# Patient Record
Sex: Female | Born: 1950 | Race: White | Hispanic: No | Smoking: Current some day smoker
Health system: Southern US, Community
[De-identification: ages and names within clinical notes are randomized; demographics above are authoritative.]

## PROBLEM LIST (undated history)

## (undated) DIAGNOSIS — H903 Sensorineural hearing loss, bilateral: Secondary | ICD-10-CM

## (undated) DIAGNOSIS — I10 Essential (primary) hypertension: Secondary | ICD-10-CM

## (undated) DIAGNOSIS — M1990 Unspecified osteoarthritis, unspecified site: Secondary | ICD-10-CM

## (undated) DIAGNOSIS — Z9889 Other specified postprocedural states: Secondary | ICD-10-CM

## (undated) DIAGNOSIS — J449 Chronic obstructive pulmonary disease, unspecified: Secondary | ICD-10-CM

## (undated) DIAGNOSIS — R7303 Prediabetes: Secondary | ICD-10-CM

## (undated) DIAGNOSIS — T7840XA Allergy, unspecified, initial encounter: Secondary | ICD-10-CM

## (undated) DIAGNOSIS — R0600 Dyspnea, unspecified: Secondary | ICD-10-CM

## (undated) DIAGNOSIS — G8929 Other chronic pain: Secondary | ICD-10-CM

## (undated) DIAGNOSIS — K759 Inflammatory liver disease, unspecified: Secondary | ICD-10-CM

## (undated) DIAGNOSIS — H9319 Tinnitus, unspecified ear: Secondary | ICD-10-CM

## (undated) DIAGNOSIS — I7300 Raynaud's syndrome without gangrene: Secondary | ICD-10-CM

## (undated) DIAGNOSIS — M5410 Radiculopathy, site unspecified: Secondary | ICD-10-CM

## (undated) DIAGNOSIS — F418 Other specified anxiety disorders: Secondary | ICD-10-CM

## (undated) DIAGNOSIS — Z8719 Personal history of other diseases of the digestive system: Secondary | ICD-10-CM

## (undated) HISTORY — DX: Allergy, unspecified, initial encounter: T78.40XA

## (undated) HISTORY — PX: TUBAL LIGATION: SHX77

## (undated) HISTORY — PX: CHOLECYSTECTOMY: SHX55

## (undated) HISTORY — PX: TONSILLECTOMY: SUR1361

## (undated) HISTORY — PX: VARICOSE VEIN SURGERY: SHX832

## (undated) HISTORY — DX: Radiculopathy, site unspecified: M54.10

---

## 2006-10-03 HISTORY — PX: AXILLARY SURGERY: SHX892

## 2013-06-18 DIAGNOSIS — M19079 Primary osteoarthritis, unspecified ankle and foot: Secondary | ICD-10-CM | POA: Insufficient documentation

## 2013-06-25 DIAGNOSIS — M792 Neuralgia and neuritis, unspecified: Secondary | ICD-10-CM | POA: Insufficient documentation

## 2013-10-03 HISTORY — PX: LAPAROSCOPIC GASTRIC SLEEVE RESECTION WITH HIATAL HERNIA REPAIR: SHX6512

## 2014-01-27 ENCOUNTER — Other Ambulatory Visit: Payer: Self-pay | Admitting: Physical Medicine and Rehabilitation

## 2014-01-27 DIAGNOSIS — M545 Low back pain, unspecified: Secondary | ICD-10-CM

## 2014-01-27 DIAGNOSIS — M79604 Pain in right leg: Secondary | ICD-10-CM

## 2014-02-05 ENCOUNTER — Other Ambulatory Visit: Payer: Self-pay

## 2014-02-11 ENCOUNTER — Ambulatory Visit
Admission: RE | Admit: 2014-02-11 | Discharge: 2014-02-11 | Disposition: A | Discharge status: Home / Self Care | Payer: Medicare Other | Source: Ambulatory Visit | Attending: Physical Medicine and Rehabilitation | Admitting: Physical Medicine and Rehabilitation

## 2014-02-11 DIAGNOSIS — M545 Low back pain, unspecified: Secondary | ICD-10-CM

## 2014-02-11 DIAGNOSIS — M79604 Pain in right leg: Secondary | ICD-10-CM

## 2014-05-31 DIAGNOSIS — E559 Vitamin D deficiency, unspecified: Secondary | ICD-10-CM | POA: Insufficient documentation

## 2014-06-12 ENCOUNTER — Ambulatory Visit: Payer: Self-pay | Admitting: Bariatrics

## 2014-06-12 DIAGNOSIS — J449 Chronic obstructive pulmonary disease, unspecified: Secondary | ICD-10-CM | POA: Insufficient documentation

## 2014-06-12 DIAGNOSIS — J4489 Other specified chronic obstructive pulmonary disease: Secondary | ICD-10-CM | POA: Insufficient documentation

## 2014-06-12 DIAGNOSIS — Z01810 Encounter for preprocedural cardiovascular examination: Secondary | ICD-10-CM

## 2014-06-12 DIAGNOSIS — F418 Other specified anxiety disorders: Secondary | ICD-10-CM | POA: Insufficient documentation

## 2014-06-12 DIAGNOSIS — F331 Major depressive disorder, recurrent, moderate: Secondary | ICD-10-CM | POA: Insufficient documentation

## 2014-06-12 DIAGNOSIS — F5104 Psychophysiologic insomnia: Secondary | ICD-10-CM | POA: Insufficient documentation

## 2014-06-16 DIAGNOSIS — M1990 Unspecified osteoarthritis, unspecified site: Secondary | ICD-10-CM | POA: Insufficient documentation

## 2014-06-16 DIAGNOSIS — M5416 Radiculopathy, lumbar region: Secondary | ICD-10-CM | POA: Insufficient documentation

## 2014-11-27 ENCOUNTER — Ambulatory Visit: Payer: Self-pay | Admitting: Emergency Medicine

## 2015-05-02 DIAGNOSIS — G8929 Other chronic pain: Secondary | ICD-10-CM | POA: Insufficient documentation

## 2015-06-09 ENCOUNTER — Other Ambulatory Visit: Payer: Self-pay | Admitting: Obstetrics and Gynecology

## 2015-06-09 DIAGNOSIS — IMO0002 Reserved for concepts with insufficient information to code with codable children: Secondary | ICD-10-CM | POA: Insufficient documentation

## 2015-06-09 DIAGNOSIS — Z1231 Encounter for screening mammogram for malignant neoplasm of breast: Secondary | ICD-10-CM

## 2015-06-17 ENCOUNTER — Ambulatory Visit
Admission: RE | Admit: 2015-06-17 | Discharge: 2015-06-17 | Disposition: A | Discharge status: Home / Self Care | Payer: Medicare Other | Source: Ambulatory Visit | Attending: Obstetrics and Gynecology | Admitting: Obstetrics and Gynecology

## 2015-06-17 DIAGNOSIS — N6459 Other signs and symptoms in breast: Secondary | ICD-10-CM | POA: Insufficient documentation

## 2015-06-17 DIAGNOSIS — Z1231 Encounter for screening mammogram for malignant neoplasm of breast: Secondary | ICD-10-CM | POA: Diagnosis not present

## 2015-06-22 ENCOUNTER — Other Ambulatory Visit: Payer: Self-pay | Admitting: Obstetrics and Gynecology

## 2015-06-22 DIAGNOSIS — R928 Other abnormal and inconclusive findings on diagnostic imaging of breast: Secondary | ICD-10-CM

## 2015-06-26 ENCOUNTER — Ambulatory Visit: Payer: Medicare Other

## 2015-07-03 ENCOUNTER — Ambulatory Visit
Admission: RE | Admit: 2015-07-03 | Discharge: 2015-07-03 | Disposition: A | Discharge status: Home / Self Care | Payer: Medicare Other | Source: Ambulatory Visit | Attending: Obstetrics and Gynecology | Admitting: Obstetrics and Gynecology

## 2015-07-03 DIAGNOSIS — R928 Other abnormal and inconclusive findings on diagnostic imaging of breast: Secondary | ICD-10-CM | POA: Diagnosis present

## 2015-09-11 ENCOUNTER — Encounter: Payer: Self-pay | Admitting: Emergency Medicine

## 2015-09-11 ENCOUNTER — Ambulatory Visit
Admission: EM | Admit: 2015-09-11 | Discharge: 2015-09-11 | Discharge status: Absent without leave | Payer: Medicare Other

## 2015-09-11 NOTE — ED Notes (Signed)
Patient concerned if she has a prolapsed bladder.  Patient states that she felt something buldging when she went to the bathroom this morning.  Patient states that she has an appointment with her doctor on Monday.

## 2015-09-14 ENCOUNTER — Other Ambulatory Visit: Payer: Self-pay | Admitting: Obstetrics and Gynecology

## 2015-09-14 DIAGNOSIS — N6489 Other specified disorders of breast: Secondary | ICD-10-CM

## 2015-12-08 DIAGNOSIS — E782 Mixed hyperlipidemia: Secondary | ICD-10-CM | POA: Insufficient documentation

## 2015-12-10 DIAGNOSIS — F418 Other specified anxiety disorders: Secondary | ICD-10-CM | POA: Insufficient documentation

## 2016-01-01 ENCOUNTER — Ambulatory Visit
Admission: RE | Admit: 2016-01-01 | Discharge: 2016-01-01 | Disposition: A | Discharge status: Home / Self Care | Payer: Medicare Other | Source: Ambulatory Visit | Attending: Obstetrics and Gynecology | Admitting: Obstetrics and Gynecology

## 2016-01-01 DIAGNOSIS — N6489 Other specified disorders of breast: Secondary | ICD-10-CM | POA: Diagnosis present

## 2016-01-21 ENCOUNTER — Encounter
Admission: RE | Admit: 2016-01-21 | Discharge: 2016-01-21 | Disposition: A | Discharge status: Home / Self Care | Payer: Medicare Other | Source: Ambulatory Visit | Attending: Obstetrics and Gynecology | Admitting: Obstetrics and Gynecology

## 2016-01-21 DIAGNOSIS — F418 Other specified anxiety disorders: Secondary | ICD-10-CM | POA: Diagnosis not present

## 2016-01-21 DIAGNOSIS — J42 Unspecified chronic bronchitis: Secondary | ICD-10-CM | POA: Diagnosis not present

## 2016-01-21 DIAGNOSIS — D259 Leiomyoma of uterus, unspecified: Secondary | ICD-10-CM | POA: Insufficient documentation

## 2016-01-21 DIAGNOSIS — F5104 Psychophysiologic insomnia: Secondary | ICD-10-CM | POA: Diagnosis not present

## 2016-01-21 DIAGNOSIS — Z01810 Encounter for preprocedural cardiovascular examination: Secondary | ICD-10-CM | POA: Diagnosis present

## 2016-01-21 DIAGNOSIS — G8929 Other chronic pain: Secondary | ICD-10-CM | POA: Diagnosis not present

## 2016-01-21 DIAGNOSIS — I10 Essential (primary) hypertension: Secondary | ICD-10-CM | POA: Diagnosis not present

## 2016-01-21 DIAGNOSIS — N8110 Cystocele, unspecified: Secondary | ICD-10-CM | POA: Insufficient documentation

## 2016-01-21 HISTORY — DX: Personal history of other diseases of the digestive system: Z87.19

## 2016-01-21 HISTORY — DX: Inflammatory liver disease, unspecified: K75.9

## 2016-01-21 HISTORY — DX: Other specified postprocedural states: Z98.890

## 2016-01-21 HISTORY — DX: Essential (primary) hypertension: I10

## 2016-01-21 NOTE — H&P (Signed)
Lori Trevino is a 65 y.o. female here for TVH / BSO and anterior colporrhaphy for a symptomatic uterine descensus and 2-3 degree cystocele U/S with noted fibroids 5cm+2cm+2cm .  . . Not sexually active, but would like in the future . + urgency . No problems emptying And no BM difficulties . No splinting    Past Medical History:  has a past medical history of Chronic insomnia; Chronic pain; COPD with chronic bronchitis , unspecified (CMS-HCC); Depression with anxiety; Osteoarthritis; Phlebitis; Right lumbar radiculopathy (06/16/2014); and Vitamin D deficiency, unspecified (05/31/2014).  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 Family History: family history includes Hypertension in her mother and sister; Osteoarthritis in her mother; Prostate cancer in her father; Skin cancer in her father. There is no history of Breast cancer. Social History:  reports that she has been smoking Cigarettes. She has been smoking about 0.10 packs per day. She has never used smokeless tobacco. She reports that she does not drink alcohol or use illicit drugs. OB/GYN History:  OB History    Gravida Para Term Preterm AB TAB SAB Ectopic Multiple Living   2 2 2       2       Allergies: is allergic to ambien [zolpidem] and metoprolol succinate. Medications:  Current Outpatient Prescriptions:  . calcium carbonate-vitamin D3 600 mg(1,500mg ) -500 unit Cap, Take 1 capsule by mouth once daily., Disp: , Rfl:  . cholecalciferol (VITAMIN D3) 2,000 unit tablet, Take 2,000 Units by mouth once daily., Disp: , Rfl:  . cyclobenzaprine (FLEXERIL) 10 MG tablet, 1/2-1 po tid prn, Disp: 90 tablet,  Rfl: 5 . estradiol (ESTRACE) 0.01 % (0.1 mg/gram) vaginal cream, Place 2 g vaginally once daily. Insert 1/4 applicator twice weekly, Disp: 30 g, Rfl: 2 . eszopiclone (LUNESTA) 2 MG tablet, Take 1 tablet (2 mg total) by mouth nightly as needed for Sleep., Disp: 30 tablet, Rfl: 5 . HYDROcodone-acetaminophen (NORCO) 10-325 mg tablet, 1/2-1 po tid prn Earliest Fill Date: 12/15/15, Disp: 70 tablet, Rfl: 0 . MILK THISTLE ORAL, Take 1 capsule by mouth once daily., Disp: , Rfl:  . multivitamin tablet, Take 1 tablet by mouth once daily., Disp: , Rfl:  . Saccharomyces boulardii (FLORASTOR) 250 mg capsule, Take 250 mg by mouth once daily., Disp: , Rfl:  . TURMERIC (CURCUMIN MISC), Use 1 tablet., Disp: , Rfl:  . biotin 10,000 mcg Cap, Take 1 capsule by mouth once daily. Reported on 12/15/2015 , Disp: , Rfl:  . tolterodine (DETROL LA) 2 MG LA capsule, Take 1 capsule (2 mg total) by mouth once daily., Disp: 30 capsule, Rfl: 11  Review of Systems: General:   No fatigue or weight loss Eyes:   No vision changes Ears:   No hearing difficulty Respiratory:   No cough or shortness of breath Pulmonary:   No asthma or shortness of breath Cardiovascular:  No chest pain, palpitations, dyspnea on exertion Gastrointestinal:  No abdominal bloating, chronic diarrhea, constipations, masses, pain or hematochezia Genitourinary:  No hematuria, dysuria, abnormal vaginal discharge, pelvic pain, Menometrorrhagia, + urgency , + pelvic relaxation  Lymphatic:  No swollen lymph nodes Musculoskeletal: No muscle weakness Neurologic:  No extremity weakness, syncope, seizure disorder Psychiatric:  No history of depression, delusions or suicidal/homicidal ideation   Exam:  Vitals:   01/21/16  BP: 128/82    Body mass index is 27 kg/(m^2).  WDWN white/ female in NAD  Lungs: CTA  CV : RRR without murmur  Breast: exam done in sitting and lying position : No dimpling or retraction, no dominant mass, no spontaneous  discharge, no axillary adenopathy Neck: no thyromegaly Abdomen: soft , no mass, normal active bowel sounds, non-tender, no rebound tenderness Pelvic: tanner stage 5 ,  External genitalia: vulva /labia no lesions Urethra: no prolapse Vagina: normal physiologic d/c, 2-3 degree cystocele , minimal rectocele ,  Cervix: no lesions, no cervical motion tenderness  Uterus: normal size shape and contour, non-tender 2 degree uterine descensus Adnexa: no mass, non-tender 3x3 cm left adnexal mass  Rectovaginal: not done   Impression:   The primary encounter diagnosis was Midline cystocele. Diagnoses of Adnexal mass, Cystocele with uterine descensus, and OAB (overactive bladder) were also pertinent to this visit.    Plan:   TVH and BSO and anterior colporrhaphy  Pt is aware of the potential risks of surgery and the risk of organ injury, blood loss requiring a blood transfusion  With infectious risks and risk of pain sex after the procedure . Urinary retention and incontinence have been discussed . All questions answered         Orders Placed This Encounter  Procedures  . US pelvic transvaginal

## 2016-01-21 NOTE — Patient Instructions (Signed)
Your procedure is scheduled on: Monday 02/08/16 Report to Day Surgery. 2ND FLOOR MEDICAL MALL ENTRANCE To find out your arrival time please call [phone] between 1PM - 3PM on Friday 02/05/16.  Remember: Instructions that are not followed completely may result in serious medical risk, up to and including death, or upon the discretion of your surgeon and anesthesiologist your surgery may need to be rescheduled.    __X__ 1. Do not eat food or drink liquids after midnight. No gum chewing or hard candies.     __X__ 2. No Alcohol for 24 hours before or after surgery.   ____ 3. Bring all medications with you on the day of surgery if instructed.    __X__ 4. Notify your doctor if there is any change in your medical condition     (cold, fever, infections).     Do not wear jewelry, make-up, hairpins, clips or nail polish.  Do not wear lotions, powders, or perfumes.   Do not shave 48 hours prior to surgery. Men may shave face and neck.  Do not bring valuables to the hospital.    [facility] is not responsible for any belongings or valuables.               Contacts, dentures or bridgework may not be worn into surgery.  Leave your suitcase in the car. After surgery it may be brought to your room.  For patients admitted to the hospital, discharge time is determined by your                treatment team.   Patients discharged the day of surgery will not be allowed to drive home.   Please read over the following fact sheets that you were given:   Surgical Site Infection Prevention   ____ Take these medicines the morning of surgery with A SIP OF WATER:    1. NONE  2.   3.   4.  5.  6.  ____ Fleet Enema (as directed)   __X__ Use CHG Soap as directed  ____ Use inhalers on the day of surgery  ____ Stop metformin 2 days prior to surgery    ____ Take 1/2 of usual insulin dose the night before surgery and none on the morning of surgery.   ____ Stop Coumadin/Plavix/aspirin on   ____ Stop  Anti-inflammatories on    __X__ Stop supplements until after surgery.  B-COMPLEX  ____ Bring C-Pap to the hospital.

## 2016-01-25 ENCOUNTER — Encounter
Admission: RE | Admit: 2016-01-25 | Discharge: 2016-01-25 | Disposition: A | Discharge status: Home / Self Care | Payer: Medicare Other | Source: Ambulatory Visit | Attending: Obstetrics and Gynecology | Admitting: Obstetrics and Gynecology

## 2016-01-25 DIAGNOSIS — Z01810 Encounter for preprocedural cardiovascular examination: Secondary | ICD-10-CM | POA: Insufficient documentation

## 2016-01-25 DIAGNOSIS — Z01812 Encounter for preprocedural laboratory examination: Secondary | ICD-10-CM | POA: Insufficient documentation

## 2016-01-25 LAB — TYPE AND SCREEN
ABO/RH(D): A POS
ANTIBODY SCREEN: NEGATIVE

## 2016-01-25 LAB — ABO/RH: ABO/RH(D): A POS

## 2016-01-25 NOTE — Pre-Procedure Instructions (Addendum)
MEDICAL CLEARANCE REQUEST/EKG AS INSTRUCTED BY DR P CARROLL CALLED AND FAXED TO DR Wallie Renshaw AND SPOKE WITH BARBARA. ALSO TO DR Crab [place]chard SPOKE WITH BECKY LYNN

## 2016-01-27 NOTE — Pre-Procedure Instructions (Signed)
DR THIESS WOULD NOT CLEAR. DR PARACHOS CLEARED LOW RISK 01/27/16

## 2016-02-04 ENCOUNTER — Inpatient Hospital Stay: Admission: RE | Admit: 2016-02-04 | Payer: Medicare Other | Source: Ambulatory Visit

## 2016-02-08 ENCOUNTER — Observation Stay
Admission: RE | Admit: 2016-02-08 | Discharge: 2016-02-09 | Disposition: A | Discharge status: Home / Self Care | Payer: Medicare Other | Source: Ambulatory Visit | Attending: Obstetrics and Gynecology | Admitting: Obstetrics and Gynecology

## 2016-02-08 ENCOUNTER — Encounter: Payer: Self-pay | Admitting: *Deleted

## 2016-02-08 ENCOUNTER — Inpatient Hospital Stay: Payer: Medicare Other | Admitting: Certified Registered"

## 2016-02-08 ENCOUNTER — Encounter
Admission: RE | Disposition: A | Discharge status: Home / Self Care | Payer: Self-pay | Source: Ambulatory Visit | Attending: Obstetrics and Gynecology

## 2016-02-08 DIAGNOSIS — Z888 Allergy status to other drugs, medicaments and biological substances status: Secondary | ICD-10-CM | POA: Insufficient documentation

## 2016-02-08 DIAGNOSIS — Z8042 Family history of malignant neoplasm of prostate: Secondary | ICD-10-CM | POA: Insufficient documentation

## 2016-02-08 DIAGNOSIS — R339 Retention of urine, unspecified: Secondary | ICD-10-CM | POA: Diagnosis not present

## 2016-02-08 DIAGNOSIS — N812 Incomplete uterovaginal prolapse: Secondary | ICD-10-CM | POA: Diagnosis present

## 2016-02-08 DIAGNOSIS — Z903 Acquired absence of stomach [part of]: Secondary | ICD-10-CM | POA: Diagnosis not present

## 2016-02-08 DIAGNOSIS — J449 Chronic obstructive pulmonary disease, unspecified: Secondary | ICD-10-CM | POA: Diagnosis not present

## 2016-02-08 DIAGNOSIS — N888 Other specified noninflammatory disorders of cervix uteri: Secondary | ICD-10-CM | POA: Diagnosis not present

## 2016-02-08 DIAGNOSIS — F17210 Nicotine dependence, cigarettes, uncomplicated: Secondary | ICD-10-CM | POA: Insufficient documentation

## 2016-02-08 DIAGNOSIS — F329 Major depressive disorder, single episode, unspecified: Secondary | ICD-10-CM | POA: Diagnosis not present

## 2016-02-08 DIAGNOSIS — Z8261 Family history of arthritis: Secondary | ICD-10-CM | POA: Insufficient documentation

## 2016-02-08 DIAGNOSIS — Z79899 Other long term (current) drug therapy: Secondary | ICD-10-CM | POA: Diagnosis not present

## 2016-02-08 DIAGNOSIS — N838 Other noninflammatory disorders of ovary, fallopian tube and broad ligament: Secondary | ICD-10-CM | POA: Insufficient documentation

## 2016-02-08 DIAGNOSIS — E559 Vitamin D deficiency, unspecified: Secondary | ICD-10-CM | POA: Insufficient documentation

## 2016-02-08 DIAGNOSIS — M1990 Unspecified osteoarthritis, unspecified site: Secondary | ICD-10-CM | POA: Diagnosis not present

## 2016-02-08 DIAGNOSIS — D259 Leiomyoma of uterus, unspecified: Secondary | ICD-10-CM | POA: Insufficient documentation

## 2016-02-08 DIAGNOSIS — F5104 Psychophysiologic insomnia: Secondary | ICD-10-CM | POA: Insufficient documentation

## 2016-02-08 DIAGNOSIS — M5416 Radiculopathy, lumbar region: Secondary | ICD-10-CM | POA: Insufficient documentation

## 2016-02-08 DIAGNOSIS — F419 Anxiety disorder, unspecified: Secondary | ICD-10-CM | POA: Insufficient documentation

## 2016-02-08 DIAGNOSIS — Z808 Family history of malignant neoplasm of other organs or systems: Secondary | ICD-10-CM | POA: Diagnosis not present

## 2016-02-08 DIAGNOSIS — G8929 Other chronic pain: Secondary | ICD-10-CM | POA: Insufficient documentation

## 2016-02-08 DIAGNOSIS — Z8249 Family history of ischemic heart disease and other diseases of the circulatory system: Secondary | ICD-10-CM | POA: Diagnosis not present

## 2016-02-08 DIAGNOSIS — N3281 Overactive bladder: Secondary | ICD-10-CM | POA: Insufficient documentation

## 2016-02-08 DIAGNOSIS — Z9889 Other specified postprocedural states: Secondary | ICD-10-CM | POA: Diagnosis not present

## 2016-02-08 HISTORY — PX: VAGINAL HYSTERECTOMY: SHX2639

## 2016-02-08 HISTORY — PX: CYSTOCELE REPAIR: SHX163

## 2016-02-08 SURGERY — HYSTERECTOMY, VAGINAL
Anesthesia: General | Site: Vagina | Wound class: Clean Contaminated

## 2016-02-08 MED ORDER — SULFANILAMIDE 15 % VA CREA
TOPICAL_CREAM | VAGINAL | Status: AC
  Filled 2016-02-08: qty 120

## 2016-02-08 MED ORDER — ONDANSETRON HCL 4 MG/2ML IJ SOLN
INTRAMUSCULAR | Status: DC | PRN
  Administered 2016-02-08: 4 mg via INTRAVENOUS

## 2016-02-08 MED ORDER — LACTATED RINGERS IV SOLN
INTRAVENOUS | Status: DC
  Administered 2016-02-08: 07:00:00 via INTRAVENOUS

## 2016-02-08 MED ORDER — LIDOCAINE-EPINEPHRINE 1 %-1:100000 IJ SOLN
INTRAMUSCULAR | Status: AC
  Filled 2016-02-08: qty 1

## 2016-02-08 MED ORDER — FENTANYL CITRATE (PF) 100 MCG/2ML IJ SOLN
INTRAMUSCULAR | Status: DC | PRN
  Administered 2016-02-08: 50 ug via INTRAVENOUS
  Administered 2016-02-08: 100 ug via INTRAVENOUS

## 2016-02-08 MED ORDER — ACETAMINOPHEN 10 MG/ML IV SOLN
INTRAVENOUS | Status: DC | PRN
  Administered 2016-02-08: 1000 mg via INTRAVENOUS

## 2016-02-08 MED ORDER — NEOSTIGMINE METHYLSULFATE 10 MG/10ML IV SOLN
INTRAVENOUS | Status: DC | PRN
  Administered 2016-02-08: 3 mg via INTRAVENOUS

## 2016-02-08 MED ORDER — GLYCOPYRROLATE 0.2 MG/ML IJ SOLN
INTRAMUSCULAR | Status: DC | PRN
  Administered 2016-02-08: 0.4 mg via INTRAVENOUS
  Administered 2016-02-08: 0.2 mg via INTRAVENOUS

## 2016-02-08 MED ORDER — CEFOXITIN SODIUM-DEXTROSE 2-2.2 GM-% IV SOLR (PREMIX)
2.0000 g | INTRAVENOUS | Status: AC
  Administered 2016-02-08: 2000 mg via INTRAVENOUS

## 2016-02-08 MED ORDER — DEXAMETHASONE SODIUM PHOSPHATE 10 MG/ML IJ SOLN
INTRAMUSCULAR | Status: DC | PRN
  Administered 2016-02-08: 4 mg via INTRAVENOUS

## 2016-02-08 MED ORDER — MIDAZOLAM HCL 2 MG/2ML IJ SOLN
INTRAMUSCULAR | Status: DC | PRN
  Administered 2016-02-08 (×2): 2 mg via INTRAVENOUS

## 2016-02-08 MED ORDER — FAMOTIDINE 20 MG PO TABS
20.0000 mg | ORAL_TABLET | Freq: Once | ORAL | Status: AC
  Administered 2016-02-08: 20 mg via ORAL

## 2016-02-08 MED ORDER — SCOPOLAMINE 1 MG/3DAYS TD PT72
1.0000 | MEDICATED_PATCH | Freq: Once | TRANSDERMAL | Status: DC
  Administered 2016-02-08: 1.5 mg via TRANSDERMAL

## 2016-02-08 MED ORDER — PROPOFOL 10 MG/ML IV BOLUS
INTRAVENOUS | Status: DC | PRN
  Administered 2016-02-08: 140 mg via INTRAVENOUS

## 2016-02-08 MED ORDER — ONDANSETRON HCL 4 MG/2ML IJ SOLN
4.0000 mg | Freq: Once | INTRAMUSCULAR | Status: AC | PRN
  Administered 2016-02-08: 4 mg via INTRAVENOUS

## 2016-02-08 MED ORDER — DOCUSATE SODIUM 100 MG PO CAPS
100.0000 mg | ORAL_CAPSULE | Freq: Two times a day (BID) | ORAL | Status: DC | PRN
  Administered 2016-02-08 – 2016-02-09 (×2): 100 mg via ORAL
  Filled 2016-02-08 (×2): qty 1

## 2016-02-08 MED ORDER — FENTANYL CITRATE (PF) 100 MCG/2ML IJ SOLN
INTRAMUSCULAR | Status: AC
  Filled 2016-02-08: qty 2

## 2016-02-08 MED ORDER — EPHEDRINE SULFATE 50 MG/ML IJ SOLN
INTRAMUSCULAR | Status: DC | PRN
  Administered 2016-02-08: 10 mg via INTRAVENOUS

## 2016-02-08 MED ORDER — FENTANYL CITRATE (PF) 100 MCG/2ML IJ SOLN
25.0000 ug | INTRAMUSCULAR | Status: DC | PRN
  Administered 2016-02-08 (×2): 25 ug via INTRAVENOUS
  Administered 2016-02-08 (×2): 50 ug via INTRAVENOUS

## 2016-02-08 MED ORDER — ACETAMINOPHEN 10 MG/ML IV SOLN
INTRAVENOUS | Status: AC
  Filled 2016-02-08: qty 100

## 2016-02-08 MED ORDER — KETOROLAC TROMETHAMINE 30 MG/ML IJ SOLN
30.0000 mg | Freq: Three times a day (TID) | INTRAMUSCULAR | Status: AC | PRN
  Administered 2016-02-08 – 2016-02-09 (×3): 30 mg via INTRAVENOUS
  Filled 2016-02-08 (×4): qty 1

## 2016-02-08 MED ORDER — ONDANSETRON HCL 4 MG/2ML IJ SOLN: 4.0000 mg | Freq: Four times a day (QID) | INTRAMUSCULAR | Status: DC | PRN

## 2016-02-08 MED ORDER — LIDOCAINE HCL (CARDIAC) 20 MG/ML IV SOLN
INTRAVENOUS | Status: DC | PRN
  Administered 2016-02-08: 80 mg via INTRAVENOUS

## 2016-02-08 MED ORDER — LACTATED RINGERS IV SOLN
INTRAVENOUS | Status: DC
  Administered 2016-02-08 – 2016-02-09 (×2): via INTRAVENOUS

## 2016-02-08 MED ORDER — LIDOCAINE-EPINEPHRINE 1 %-1:100000 IJ SOLN
INTRAMUSCULAR | Status: DC | PRN
  Administered 2016-02-08: 16 mL

## 2016-02-08 MED ORDER — MORPHINE SULFATE (PF) 2 MG/ML IV SOLN
1.0000 mg | INTRAVENOUS | Status: DC | PRN
  Administered 2016-02-08 – 2016-02-09 (×5): 2 mg via INTRAVENOUS
  Filled 2016-02-08 (×5): qty 1

## 2016-02-08 MED ORDER — CITRIC ACID-SODIUM CITRATE 334-500 MG/5ML PO SOLN
30.0000 mL | ORAL | Status: DC
  Filled 2016-02-08: qty 30

## 2016-02-08 MED ORDER — ONDANSETRON HCL 4 MG PO TABS: 4.0000 mg | ORAL_TABLET | Freq: Four times a day (QID) | ORAL | Status: DC | PRN

## 2016-02-08 MED ORDER — HYDROCODONE-ACETAMINOPHEN 5-325 MG PO TABS
1.0000 | ORAL_TABLET | ORAL | Status: DC | PRN
  Administered 2016-02-08 – 2016-02-09 (×5): 2 via ORAL
  Filled 2016-02-08 (×6): qty 2

## 2016-02-08 MED ORDER — PHENYLEPHRINE HCL 10 MG/ML IJ SOLN
INTRAMUSCULAR | Status: DC | PRN
  Administered 2016-02-08 (×2): 100 ug via INTRAVENOUS
  Administered 2016-02-08: 50 ug via INTRAVENOUS

## 2016-02-08 MED ORDER — ROCURONIUM BROMIDE 100 MG/10ML IV SOLN
INTRAVENOUS | Status: DC | PRN
  Administered 2016-02-08: 10 mg via INTRAVENOUS
  Administered 2016-02-08: 5 mg via INTRAVENOUS
  Administered 2016-02-08: 10 mg via INTRAVENOUS
  Administered 2016-02-08: 40 mg via INTRAVENOUS
  Administered 2016-02-08: 10 mg via INTRAVENOUS
  Administered 2016-02-08: 5 mg via INTRAVENOUS

## 2016-02-08 MED ORDER — ONDANSETRON HCL 4 MG/2ML IJ SOLN
INTRAMUSCULAR | Status: AC
  Filled 2016-02-08: qty 2

## 2016-02-08 SURGICAL SUPPLY — 39 items
BAG URO DRAIN 2000ML W/SPOUT (MISCELLANEOUS) ×4 IMPLANT
CANISTER SUCT 1200ML W/VALVE (MISCELLANEOUS) ×4 IMPLANT
CATH FOLEY 2WAY  5CC 16FR (CATHETERS) ×2
CATH ROBINSON RED A/P 16FR (CATHETERS) ×4 IMPLANT
CATH URTH 16FR FL 2W BLN LF (CATHETERS) ×2 IMPLANT
DRAPE PERI LITHO V/GYN (MISCELLANEOUS) ×4 IMPLANT
DRAPE SURG 17X11 SM STRL (DRAPES) ×4 IMPLANT
DRAPE UNDER BUTTOCK W/FLU (DRAPES) ×4 IMPLANT
ELECT REM PT RETURN 9FT ADLT (ELECTROSURGICAL) ×4
ELECTRODE REM PT RTRN 9FT ADLT (ELECTROSURGICAL) ×2 IMPLANT
ETHIBOND 2 0 GREEN CT 2 30IN (SUTURE) ×8 IMPLANT
GAUZE PACK 2X3YD (MISCELLANEOUS) ×4 IMPLANT
GLOVE BIO SURGEON STRL SZ8 (GLOVE) ×4 IMPLANT
GOWN STRL REUS W/ TWL LRG LVL3 (GOWN DISPOSABLE) ×6 IMPLANT
GOWN STRL REUS W/ TWL XL LVL3 (GOWN DISPOSABLE) ×2 IMPLANT
GOWN STRL REUS W/TWL LRG LVL3 (GOWN DISPOSABLE) ×6
GOWN STRL REUS W/TWL XL LVL3 (GOWN DISPOSABLE) ×2
KIT RM TURNOVER CYSTO AR (KITS) ×4 IMPLANT
KIT RM TURNOVER STRD PROC AR (KITS) ×4 IMPLANT
LABEL OR SOLS (LABEL) ×4 IMPLANT
NDL SAFETY 22GX1.5 (NEEDLE) ×4 IMPLANT
NS IRRIG 500ML POUR BTL (IV SOLUTION) ×4 IMPLANT
PACK BASIN MINOR ARMC (MISCELLANEOUS) ×4 IMPLANT
PAD OB MATERNITY 4.3X12.25 (PERSONAL CARE ITEMS) ×4 IMPLANT
PAD PREP 24X41 OB/GYN DISP (PERSONAL CARE ITEMS) ×4 IMPLANT
SPONGE XRAY 4X4 16PLY STRL (MISCELLANEOUS) ×4 IMPLANT
SUT PDS 2-0 27IN (SUTURE) ×4 IMPLANT
SUT PDS AB 2-0 CT1 27 (SUTURE) ×4 IMPLANT
SUT VIC AB 0 CT1 27 (SUTURE) ×6
SUT VIC AB 0 CT1 27XCR 8 STRN (SUTURE) ×6 IMPLANT
SUT VIC AB 0 CT1 36 (SUTURE) ×8 IMPLANT
SUT VIC AB 2-0 CT1 36 (SUTURE) ×8 IMPLANT
SUT VIC AB 2-0 SH 27 (SUTURE) ×12
SUT VIC AB 2-0 SH 27XBRD (SUTURE) ×12 IMPLANT
SUT VIC AB 3-0 SH 27 (SUTURE) ×2
SUT VIC AB 3-0 SH 27X BRD (SUTURE) ×2 IMPLANT
SYR CONTROL 10ML (SYRINGE) ×4 IMPLANT
SYRINGE 10CC LL (SYRINGE) ×4 IMPLANT
WATER STERILE IRR 1000ML POUR (IV SOLUTION) ×4 IMPLANT

## 2016-02-08 NOTE — Progress Notes (Signed)
Pt ready for TVH and anterior colporrhaphy . NPO . Labs nl all questions answered .

## 2016-02-08 NOTE — Brief Op Note (Signed)
02/08/2016  9:38 AM  PATIENT:  Lori Trevino  65 y.o. female  PRE-OPERATIVE DIAGNOSIS:  Cystocele, Uterine Descensus, Fibroids  POST-OPERATIVE DIAGNOSIS:  Cystocele, Uterine Descensus, Fibroids  PROCEDURE:  TVH , bilateral salpingectomy, anterior colporrhoaphy SURGEON:  Surgeon(s) and Role:    * Boykin Nearing, MD - Primary    * Benjaman Kindler, MD - Assisting  PHYSICIAN ASSISTANT:   ASSISTANTS:  ANESTHESIA: GETA EBL:  Total I/O In: 700 [I.V.:700] Out: 175 [Urine:100; Blood:75]  BLOOD ADMINISTERED:none  DRAINS: Urinary Catheter (Foley)   LOCAL MEDICATIONS USED:  LIDOCAINE  and Amount: 15 ml  SPECIMEN:  Source of Specimen:  uterus , fallopian tubes   DISPOSITION OF SPECIMEN:  PATHOLOGY  COUNTS:  YES  TOURNIQUET:  * No tourniquets in log *  DICTATION: .Other Dictation: Dictation Number verbal  PLAN OF CARE: Admit for overnight observation  PATIENT DISPOSITION:  PACU - hemodynamically stable.   Delay start of Pharmacological VTE agent (>24hrs) due to surgical blood loss or risk of bleeding: not applicable

## 2016-02-08 NOTE — Anesthesia Procedure Notes (Signed)
Procedure Name: Intubation Performed by: Olimpia Tinch Pre-anesthesia Checklist: Patient identified, Emergency Drugs available, Suction available, Patient being monitored and Timeout performed Patient Re-evaluated:Patient Re-evaluated prior to inductionOxygen Delivery Method: Circle system utilized Preoxygenation: Pre-oxygenation with 100% oxygen Intubation Type: IV induction Ventilation: Mask ventilation without difficulty Laryngoscope Size: Mac and 3 Grade View: Grade I Tube type: Oral Tube size: 7.0 mm Number of attempts: 1 Airway Equipment and Method: Stylet Placement Confirmation: ETT inserted through vocal cords under direct vision,  positive ETCO2 and breath sounds checked- equal and bilateral Secured at: 21 cm Tube secured with: Tape Dental Injury: Teeth and Oropharynx as per pre-operative assessment      

## 2016-02-08 NOTE — Anesthesia Preprocedure Evaluation (Signed)
Anesthesia Evaluation  Patient identified by MRN, date of birth, ID band Patient awake    Reviewed: Allergy & Precautions, H&P , NPO status , Patient's Chart, lab work & pertinent test results, reviewed documented beta blocker date and time   History of Anesthesia Complications (+) PONV and history of anesthetic complications  Airway Mallampati: II  TM Distance: >3 FB Neck ROM: full    Dental no notable dental hx. (+) Partial Upper, Poor Dentition   Pulmonary neg shortness of breath, neg sleep apnea, neg COPD, neg recent URI, Current Smoker,    Pulmonary exam normal breath sounds clear to auscultation       Cardiovascular Exercise Tolerance: Good hypertension (now not on meds since weight loss), (-) angina(-) CAD, (-) Past MI, (-) Cardiac Stents and (-) CABG Normal cardiovascular exam(-) dysrhythmias (-) Valvular Problems/Murmurs Rhythm:regular Rate:Normal     Neuro/Psych negative neurological ROS  negative psych ROS   GI/Hepatic hiatal hernia, (+) Hepatitis -, B  Endo/Other  negative endocrine ROS  Renal/GU negative Renal ROS  negative genitourinary   Musculoskeletal   Abdominal   Peds  Hematology negative hematology ROS (+)   Anesthesia Other Findings Past Medical History:   Hypertension                                                 History of hiatal hernia                                     Hepatitis                                                      Comment:hepatitis B   PONV (postoperative nausea and vomiting)                     Reproductive/Obstetrics negative OB ROS                             Anesthesia Physical Anesthesia Plan  ASA: II  Anesthesia Plan: General   Post-op Pain Management:    Induction:   Airway Management Planned:   Additional Equipment:   Intra-op Plan:   Post-operative Plan:   Informed Consent: I have reviewed the patients History and  Physical, chart, labs and discussed the procedure including the risks, benefits and alternatives for the proposed anesthesia with the patient or authorized representative who has indicated his/her understanding and acceptance.   Dental Advisory Given  Plan Discussed with: Anesthesiologist, CRNA and Surgeon  Anesthesia Plan Comments:         Anesthesia Quick Evaluation

## 2016-02-08 NOTE — Progress Notes (Signed)
Patient ID: Lori Trevino, female   DOB: [birth year], 65 y.o.   MRN: [id number] DOS TVH and bilateral salpingectomy and anterior colporraphy Pain ok  O: VSS  Urine output good  minimal bleeding on pad  A: stable  P: d/c foley 0500 in am and voiding trial  Before 0800

## 2016-02-08 NOTE — Transfer of Care (Signed)
Immediate Anesthesia Transfer of Care Note  Patient: Lori Trevino  Procedure(s) Performed: Procedure(s): HYSTERECTOMY VAGINAL WITH BSO (N/A) ANTERIOR REPAIR (CYSTOCELE) (N/A)  Patient Location: PACU  Anesthesia Type:General  Level of Consciousness: awake, alert  and responds to stimulation  Airway & Oxygen Therapy: Patient Spontanous Breathing and Patient connected to face mask oxygen  Post-op Assessment: Report given to RN and Post -op Vital signs reviewed and stable  Post vital signs: Reviewed and stable  Last Vitals:  Filed Vitals:   02/08/16 0618 02/08/16 0950  BP: 135/85 113/63  Pulse: 72 66  Temp: 36.6 C   Resp: 16 14    Last Pain: There were no vitals filed for this visit.       Complications: No apparent anesthesia complications

## 2016-02-09 DIAGNOSIS — N812 Incomplete uterovaginal prolapse: Secondary | ICD-10-CM | POA: Diagnosis not present

## 2016-02-09 LAB — SURGICAL PATHOLOGY

## 2016-02-09 LAB — BASIC METABOLIC PANEL
ANION GAP: 5 (ref 5–15)
BUN: 14 mg/dL (ref 6–20)
CALCIUM: 8.8 mg/dL — AB (ref 8.9–10.3)
CO2: 28 mmol/L (ref 22–32)
CREATININE: 0.6 mg/dL (ref 0.44–1.00)
Chloride: 103 mmol/L (ref 101–111)
Glucose, Bld: 108 mg/dL — ABNORMAL HIGH (ref 65–99)
Potassium: 4.3 mmol/L (ref 3.5–5.1)
SODIUM: 136 mmol/L (ref 135–145)

## 2016-02-09 LAB — CBC
HEMATOCRIT: 36 % (ref 35.0–47.0)
Hemoglobin: 12 g/dL (ref 12.0–16.0)
MCH: 31.3 pg (ref 26.0–34.0)
MCHC: 33.3 g/dL (ref 32.0–36.0)
MCV: 93.9 fL (ref 80.0–100.0)
PLATELETS: 193 10*3/uL (ref 150–440)
RBC: 3.83 MIL/uL (ref 3.80–5.20)
RDW: 13.6 % (ref 11.5–14.5)
WBC: 7.1 10*3/uL (ref 3.6–11.0)

## 2016-02-09 MED ORDER — BETHANECHOL CHLORIDE 5 MG PO TABS: 5.0000 mg | ORAL_TABLET | Freq: Three times a day (TID) | ORAL | Status: DC

## 2016-02-09 MED ORDER — HYDROCODONE-ACETAMINOPHEN 5-325 MG PO TABS: 1.0000 | ORAL_TABLET | ORAL | Status: DC | PRN

## 2016-02-09 MED ORDER — DOCUSATE SODIUM 100 MG PO CAPS: 100.0000 mg | ORAL_CAPSULE | Freq: Two times a day (BID) | ORAL | Status: DC | PRN

## 2016-02-09 MED ORDER — BETHANECHOL CHLORIDE 10 MG PO TABS
5.0000 mg | ORAL_TABLET | Freq: Three times a day (TID) | ORAL | Status: DC
  Administered 2016-02-09 (×2): 5 mg via ORAL
  Filled 2016-02-09 (×4): qty 0.5

## 2016-02-09 NOTE — Anesthesia Postprocedure Evaluation (Signed)
Anesthesia Post Note  Patient: Lori Trevino  Procedure(s) Performed: Procedure(s) (LRB): HYSTERECTOMY VAGINAL WITH BSO (N/A) ANTERIOR REPAIR (CYSTOCELE) (N/A)  Patient location during evaluation: PACU Anesthesia Type: General Level of consciousness: awake and alert Pain management: pain level controlled Vital Signs Assessment: post-procedure vital signs reviewed and stable Respiratory status: spontaneous breathing, nonlabored ventilation, respiratory function stable and patient connected to nasal cannula oxygen Cardiovascular status: blood pressure returned to baseline and stable Postop Assessment: no signs of nausea or vomiting Anesthetic complications: no    Last Vitals:  Filed Vitals:   02/09/16 0749 02/09/16 1210  BP: 150/70 149/78  Pulse: 61 74  Temp: 37 C 36.7 C  Resp: 20 18    Last Pain:  Filed Vitals:   02/09/16 1211  PainSc: 7                  Martha Clan

## 2016-02-09 NOTE — Discharge Instructions (Signed)
Hysterectomy Information   A hysterectomy is a surgery in which your uterus is removed. This surgery may be done to treat various medical problems. After the surgery, you will no longer have menstrual periods. The surgery will also make you unable to become pregnant (sterile). The fallopian tubes and ovaries can be removed (bilateral salpingo-oophorectomy) during this surgery as well.   REASONS FOR A HYSTERECTOMY  · Persistent, abnormal bleeding.  · Lasting (chronic) pelvic pain or infection.  · The lining of the uterus (endometrium) starts growing outside the uterus (endometriosis).  · The endometrium starts growing in the muscle of the uterus (adenomyosis).  · The uterus falls down into the vagina (pelvic organ prolapse).  · Noncancerous growths in the uterus (uterine fibroids) that cause symptoms.  · Precancerous cells.  · Cervical cancer or uterine cancer.  TYPES OF HYSTERECTOMIES  · Supracervical hysterectomy--In this type, the top part of the uterus is removed, but not the cervix.  · Total hysterectomy--The uterus and cervix are removed.  · Radical hysterectomy--The uterus, the cervix, and the fibrous tissue that holds the uterus in place in the pelvis (parametrium) are removed.  WAYS A HYSTERECTOMY CAN BE PERFORMED  · Abdominal hysterectomy--A large surgical cut (incision) is made in the abdomen. The uterus is removed through this incision.  · Vaginal hysterectomy--An incision is made in the vagina. The uterus is removed through this incision. There are no abdominal incisions.  · Conventional laparoscopic hysterectomy--Three or four small incisions are made in the abdomen. A thin, lighted tube with a camera (laparoscope) is inserted into one of the incisions. Other tools are put through the other incisions. The uterus is cut into small pieces. The small pieces are removed through the incisions, or they are removed through the vagina.  · Laparoscopically assisted vaginal hysterectomy (LAVH)--Three or four  small incisions are made in the abdomen. Part of the surgery is performed laparoscopically and part vaginally. The uterus is removed through the vagina.  · Robot-assisted laparoscopic hysterectomy--A laparoscope and other tools are inserted into 3 or 4 small incisions in the abdomen. A computer-controlled device is used to give the surgeon a 3D image and to help control the surgical instruments. This allows for more precise movements of surgical instruments. The uterus is cut into small pieces and removed through the incisions or removed through the vagina.  RISKS AND COMPLICATIONS   Possible complications associated with this procedure include:  · Bleeding and risk of blood transfusion. Tell your health care provider if you do not want to receive any blood products.  · Blood clots in the legs or lung.  · Infection.  · Injury to surrounding organs.  · Problems or side effects related to anesthesia.  · Conversion to an abdominal hysterectomy from one of the other techniques.  WHAT TO EXPECT AFTER A HYSTERECTOMY  · You will be given pain medicine.  · You will need to have someone with you for the first 3-5 days after you go home.  · You will need to follow up with your surgeon in 2-4 weeks after surgery to evaluate your progress.  · You may have early menopause symptoms such as hot flashes, night sweats, and insomnia.  · If you had a hysterectomy for a problem that was not cancer or not a condition that could lead to cancer, then you no longer need Pap tests. However, even if you no longer need a Pap test, a regular exam is a good idea to make sure no   other problems are starting.     This information is not intended to replace advice given to you by your health care provider. Make sure you discuss any questions you have with your health care provider.     Document Released: 03/15/2001 Document Revised: 07/10/2013 Document Reviewed: 05/27/2013  Elsevier Interactive Patient Education ©2016 [employer]

## 2016-02-09 NOTE — Progress Notes (Signed)
Patient ID: Lori Trevino, female   DOB: March 19, 1951, 65 y.o.   MRN: [id number] Pt has been voiding all day 100 cc at a time . Sensation and flow improving . Post void 200 cc.  On urecholine  Will d/c home Pt is encourage to void frequently

## 2016-02-09 NOTE — Progress Notes (Signed)
D/C order from MD.  Reviewed d/c instructions and prescriptions with patient and answered any questions.  Patient d/c home with her son.

## 2016-02-09 NOTE — Op Note (Signed)
NAME:  Lori Trevino, Lori Trevino NO.:  [id number]  MEDICAL RECORD NO.:  [id number]  LOCATION:  [id number]                         FACILITY:  [facility]  PHYSICIAN:  Laverta Baltimore, MDDATE OF BIRTH:  Mar 14, 1951  DATE OF PROCEDURE: DATE OF DISCHARGE:                              OPERATIVE REPORT   PREOPERATIVE DIAGNOSIS: 1. Uterine descensus. 2. Grade 2-3 cystocele.  POSTOPERATIVE DIAGNOSIS: 1. Uterine descensus. 2. Grade 2-3 cystocele.  PROCEDURE PERFORMED: 1. Total vaginal hysterectomy. 2. Bilateral salpingectomy. 3. Anterior colporrhaphy.  SURGEON:  Laverta Baltimore, MD  ANESTHESIA:  General endotracheal anesthesia.  SURGEON:  Laverta Baltimore, MD.  FIRST ASSISTANT:  Leafy Ro.  INDICATIONS:  A 65 year old female with complaints of falling vaginal tissue.  Patient on exam has a grade 2-3 cystocele and uterine descensus.  DESCRIPTION OF PROCEDURE:  After adequate general endotracheal anesthesia, patient was placed in dorsal supine position.  Legs were placed in the candy-cane stirrups and lower abdomen and perineal and vagina were prepped with Betadine.  The patient had previously received 2 g IV cefoxitin prior to commencement of the case.  The patient was prepped and draped in normal sterile fashion.  Time-out was performed. Straight catheterization with a red Robinson catheter yielded 100 mL clear urine.  Weighted speculum was placed in the posterior vaginal vault and the cervix was grasped with 2 thyroid tenacula.  Cervix was circumferentially injected with 1% lidocaine with 1:100,000 epinephrine. A direct posterior colpotomy incision was made upon entry into the posterior cul-de-sac.  A long-billed weighted speculum was placed.  The uterosacral ligaments were then bilaterally clamped, transected, suture ligated with 0 Vicryl suture.  Anterior cervix was circumferentially incised with the Bovie and the anterior cul-de-sac was entered sharply. Cardinal  ligaments were bilaterally clamped, transected, suture ligated with 0 Vicryl suture.  The uterine arteries were bilaterally clamped, transected, suture ligated with 0 Vicryl suture.  Several large fibroids were noted.  These were dissected, cored out from the uterus. Ultimately, the cornua were identified and were bilaterally clamped, transected, doubly suture ligated with 0 Vicryl suture.  The uterus was then totally removed.  The ovaries were identified, but were high on the sidewalls and therefore were not removed.  The fallopian tubes were grasped with a Babcock clamp and transected and suture ligated with 0 Vicryl suture.  Good hemostasis was noted.  Peritoneum was then closed with a pursestring 2-0 PDS suture.  The anterior vagina was then grasped centrally with 2 Allis clamps.  The tissue was placed on traction and the subepithelial tissue was injected with 1% lidocaine with epinephrine and this injection was performed all the way to 1.5 cm from the urethral meatus.  Metzenbaum scissors were used to dissect the epithelial tissues and it was opened in a straight midline position.  Once the vaginal epithelium was opened, the edges were grasped with multiple mosquito clamps and the subvaginal endopelvic fascia was dissected free from the vaginal epithelium.  Good hemostasis was noted.  Four separate mattress sutures were used to approximate healthy lateral endopelvic fascia from each side and the cystocele was reduced.  Vaginal tissues were then trimmed and the vaginal epithelium was closed with 0 Vicryl suture in  a running, nonlocking fashion.  Vaginal hysterectomy defect was also closed with 0 Vicryl suture.  The uterosacral ligaments were plicated centrally and the rest of the vaginal cuff was closed.  There was good hemostasis noted.  Given minimal bleeding from the anterior colporrhaphy, there will be no vaginal packing.  Foley catheter was replaced at the end of the case yielding  clear urine, additional 75 mL of urine.  There were no complications.  ESTIMATED BLOOD LOSS:  75 ML.  INTRAOPERATIVE FLUIDS:  700 mL.  URINE OUTPUT:  175 mL.  The patient was taken to recovery room in good condition.          ______________________________ Laverta Baltimore, MD     TS/MEDQ  D:  02/08/2016  T:  02/09/2016  Job:  [id number]

## 2016-02-09 NOTE — Progress Notes (Signed)
1 Day Post-Op Procedure(s) (LRB): HYSTERECTOMY VAGINAL WITH bilat salpingectomy +ANTERIOR REPAIR (CYSTOCELE) (N/A)  Subjective: Patient reports inability to void . Only 2cc + 50 cc residual 250 cc  Objective: I have reviewed patient's vital signs. Lung CTA without rales  CV rrr with M  abd soft    Assessment:  Urinary retention  Plan:add urecholine 5 mg tid  Repeat voiding trial . If > 400 cc needs in and out cath  Advance diet, cbc and met b ordered   LOS: 1 day    SCHERMERHORN,THOMAS 02/09/2016, 9:23 AM

## 2016-02-09 NOTE — Discharge Summary (Signed)
Physician Discharge Summary  Patient ID: Lori Trevino MRN: [id number] DOB/AGE: 67-Mar-1952 67 y.o.  Admit date: 02/08/2016 Discharge date: 02/09/2016  Admission Diagnoses:uterine descensus , cystocele  Discharge Diagnoses: same  Active Problems:   Post-operative state   Discharged Condition: good  Hospital Course: pt underwent an uncomplicated TVH , bilateral salpingectomy and anterior colporrhaphy. Mild urinary retention , but urination without difficulty on d/c with 200 cc residual. On urecholine  Consults: None  Significant Diagnostic Studies: labs: hct 36% ,   Treatments: IV hydration and analgesia: Vicodin and toradol  Discharge Exam: Blood pressure 138/70, pulse 64, temperature 97.9 F (36.6 C), temperature source Oral, resp. rate 20, height 5\' 2"  (1.575 m), weight 144 lb (65.318 kg), SpO2 99 %.  Lungs CTA  CV RRR  abd soft NT  Perineum spotting only   Disposition: ED Dismiss - Never Arrived  Discharge Instructions    Call MD for:  difficulty breathing, headache or visual disturbances    Complete by:  As directed      Call MD for:  extreme fatigue    Complete by:  As directed      Call MD for:  hives    Complete by:  As directed      Call MD for:  persistant dizziness or light-headedness    Complete by:  As directed      Call MD for:  persistant nausea and vomiting    Complete by:  As directed      Call MD for:  redness, tenderness, or signs of infection (pain, swelling, redness, odor or green/yellow discharge around incision site)    Complete by:  As directed      Call MD for:  severe uncontrolled pain    Complete by:  As directed      Call MD for:  temperature >100.4    Complete by:  As directed      Diet - low sodium heart healthy    Complete by:  As directed      Increase activity slowly    Complete by:  As directed             Medication List    STOP taking these medications        estradiol 0.1 MG/GM vaginal cream  Commonly known as:  ESTRACE     eszopiclone 2 MG Tabs tablet  Commonly known as:  LUNESTA     HYDROcodone-acetaminophen 10-325 MG tablet  Commonly known as:  NORCO  Replaced by:  HYDROcodone-acetaminophen 5-325 MG tablet      TAKE these medications        B-COMPLEX PO  Take 1 tablet by mouth daily.     bethanechol 5 MG tablet  Commonly known as:  URECHOLINE  Take 1 tablet (5 mg total) by mouth 3 (three) times daily.     CALCIUM CITRATE + D3 PO  Take 1 tablet by mouth daily.     cyclobenzaprine 10 MG tablet  Commonly known as:  FLEXERIL  Take 5-10 mg by mouth 3 (three) times daily as needed for muscle spasms.     docusate sodium 100 MG capsule  Commonly known as:  COLACE  Take 1 capsule (100 mg total) by mouth 2 (two) times daily as needed for mild constipation.     HYDROcodone-acetaminophen 5-325 MG tablet  Commonly known as:  NORCO/VICODIN  Take 1-2 tablets by mouth every 4 (four) hours as needed for moderate pain.     loratadine 10 MG tablet  Commonly known as:  CLARITIN  Take 10 mg by mouth daily as needed for allergies.     ONE-A-DAY 55 PLUS PO  Take 1 tablet by mouth daily.     Vitamin D3 2000 units Tabs  Take 1 tablet by mouth daily.           Follow-up Information    Follow up with SCHERMERHORN,THOMAS, MD In 2 weeks.   Specialty:  Obstetrics and Gynecology   Why:  For wound re-check   Contact information:   [address] [place] [address] [phone]       Signed: Laverta Baltimore 02/09/2016, 4:54 PM

## 2016-04-19 ENCOUNTER — Ambulatory Visit
Admission: EM | Admit: 2016-04-19 | Discharge: 2016-04-19 | Disposition: A | Discharge status: Home / Self Care | Payer: Medicare Other | Attending: Family Medicine | Admitting: Family Medicine

## 2016-04-19 DIAGNOSIS — L03119 Cellulitis of unspecified part of limb: Secondary | ICD-10-CM | POA: Diagnosis not present

## 2016-04-19 MED ORDER — DOXYCYCLINE HYCLATE 100 MG PO CAPS: 100.0000 mg | ORAL_CAPSULE | Freq: Two times a day (BID) | ORAL | Status: DC

## 2016-04-19 MED ORDER — MUPIROCIN 2 % EX OINT: 1.0000 "application " | TOPICAL_OINTMENT | Freq: Three times a day (TID) | CUTANEOUS | Status: DC

## 2016-04-19 NOTE — ED Notes (Signed)
Patient complains of rash on her right upper thigh. Patient states that she thinks it may be the start of cellulitis. Patient states that she is leaving for [place] on Monday for the [place]. Patient states that area is warm to the touch, red and itchy.

## 2016-04-19 NOTE — ED Provider Notes (Signed)
CSN: [id number]     Arrival date & time 04/19/16  1923 History   First MD Initiated Contact with Patient 04/19/16 2017     Chief Complaint  Patient presents with  . Rash   (Consider location/radiation/quality/duration/timing/severity/associated sxs/prior Treatment) HPI This a 65 year old presents with a rash on her right upper thigh. She may have been bitten by an insect has been scratching the area. States that has since that initial bite it has been itchy recently has noticed that it has become erythematous, tender and warm his had no fever or chills. Is planning on leaving for a trip to [place] on Monday and then to be in the [place] and out of touch for immediate medical care.     Past Medical History  Diagnosis Date  . Hypertension   . History of hiatal hernia   . Hepatitis     hepatitis B  . PONV (postoperative nausea and vomiting)    Past Surgical History  Procedure Laterality Date  . Cholecystectomy    . Hernia repair    . Axillary surgery Right 2008  . Varicose vein surgery    . Tubal ligation    . Tonsillectomy    . Laparoscopic gastric sleeve resection with hiatal hernia repair    . Vaginal hysterectomy N/A 02/08/2016    Procedure: HYSTERECTOMY VAGINAL WITH BSO;  Surgeon: Boykin Nearing, MD;  Location: [facility] ORS;  Service: Gynecology;  Laterality: N/A;  . Cystocele repair N/A 02/08/2016    Procedure: ANTERIOR REPAIR (CYSTOCELE);  Surgeon: Boykin Nearing, MD;  Location: [facility] ORS;  Service: Gynecology;  Laterality: N/A;   Family History  Problem Relation Age of Onset  . Breast cancer Neg Hx    Social History  Substance Use Topics  . Smoking status: Former Research scientist (life sciences)  . Smokeless tobacco: Never Used  . Alcohol Use: 0.0 oz/week    0 Standard drinks or equivalent per week     Comment: wine occasionally   OB History    No data available     Review of Systems  Constitutional: Negative for fever, chills, activity change and fatigue.  Skin: Positive  for rash and wound.  All other systems reviewed and are negative.   Allergies  Review of patient's allergies indicates no known allergies.  Home Medications   Prior to Admission medications   Medication Sig Start Date End Date Taking? Authorizing Provider  B Complex-Biotin-FA (B-COMPLEX PO) Take 1 tablet by mouth daily.   Yes Historical Provider, MD  Calcium Citrate-Vitamin D (CALCIUM CITRATE + D3 PO) Take 1 tablet by mouth daily.   Yes Historical Provider, MD  Cholecalciferol (VITAMIN D3) 2000 units TABS Take 1 tablet by mouth daily.   Yes Historical Provider, MD  cyclobenzaprine (FLEXERIL) 10 MG tablet Take 5-10 mg by mouth 3 (three) times daily as needed for muscle spasms.   Yes Historical Provider, MD  docusate sodium (COLACE) 100 MG capsule Take 1 capsule (100 mg total) by mouth 2 (two) times daily as needed for mild constipation. 02/09/16  Yes Gwen Her Schermerhorn, MD  eszopiclone (LUNESTA) 1 MG TABS tablet Take 1 mg by mouth at bedtime as needed for sleep. Take immediately before bedtime   Yes Historical Provider, MD  HYDROcodone-acetaminophen (NORCO/VICODIN) 5-325 MG tablet Take 1-2 tablets by mouth every 4 (four) hours as needed for moderate pain. 02/09/16  Yes Gwen Her Schermerhorn, MD  loratadine (CLARITIN) 10 MG tablet Take 10 mg by mouth daily as needed for allergies.   Yes  Historical Provider, MD  Multiple Vitamin (ONE-A-DAY 55 PLUS PO) Take 1 tablet by mouth daily.   Yes Historical Provider, MD  bethanechol (URECHOLINE) 5 MG tablet Take 1 tablet (5 mg total) by mouth 3 (three) times daily. 02/09/16   Gwen Her Schermerhorn, MD  doxycycline (VIBRAMYCIN) 100 MG capsule Take 1 capsule (100 mg total) by mouth 2 (two) times daily. 04/19/16   Lorin Picket, PA-C  mupirocin ointment (BACTROBAN) 2 % Apply 1 application topically 3 (three) times daily. 04/19/16   Lorin Picket, PA-C   Meds Ordered and Administered this Visit  Medications - No data to display  BP 101/63 mmHg  Pulse 67   Temp(Src) 97.8 F (36.6 C) (Oral)  Resp 18  Ht 5\' 2"  (1.575 m)  Wt 140 lb (63.504 kg)  BMI 25.60 kg/m2  SpO2 100% No data found.   Physical Exam  Constitutional: She is oriented to person, place, and time. She appears well-developed and well-nourished. No distress.  HENT:  Head: Normocephalic and atraumatic.  Eyes: Conjunctivae are normal. Pupils are equal, round, and reactive to light.  Neck: Normal range of motion. Neck supple.  Musculoskeletal: Normal range of motion. She exhibits edema and tenderness.  Neurological: She is alert and oriented to person, place, and time.  Skin: Skin is warm and dry. Rash noted. She is not diaphoretic. There is erythema.  Examination of the right upper thigh 1 hands breath below the thigh flexion crease is a punctate wound with surrounding erythema that is petechial in nature but blanchable. The central area has some induration of approximately half centimeter in diameter no drainage is seen or expressed It is warm to the touch and slightly tender.  Psychiatric: She has a normal mood and affect. Her behavior is normal. Judgment and thought content normal.  Nursing note and vitals reviewed.   ED Course  Procedures (including critical care time)  Labs Review Labs Reviewed - No data to display  Imaging Review No results found.   Visual Acuity Review  Right Eye Distance:   Left Eye Distance:   Bilateral Distance:    Right Eye Near:   Left Eye Near:    Bilateral Near:         MDM   1. Cellulitis of thigh    Discharge Medication List as of 04/19/2016  8:30 PM    START taking these medications   Details  doxycycline (VIBRAMYCIN) 100 MG capsule Take 1 capsule (100 mg total) by mouth 2 (two) times daily., Starting 04/19/2016, Until Discontinued, Print    mupirocin ointment (BACTROBAN) 2 % Apply 1 application topically 3 (three) times daily., Starting 04/19/2016, Until Discontinued, Print      Plan: 1. Test/x-ray results and  diagnosis reviewed with patient 2. rx as per orders; risks, benefits, potential side effects reviewed with patient 3. Recommend supportive treatment with him compresses 3 times a day for 10 minutes each time. Was explained in detail to the patient. After the warm compress she will try the area thoroughly and apply Bactroban ointment. I will also start her on 5 days of doxycycline. If she has any difficulties between now and then she may return to our clinic. 4. F/u prn if symptoms worsen or don't improve     Lorin Picket, PA-C 04/19/16 2041

## 2016-04-19 NOTE — Discharge Instructions (Signed)

## 2016-09-01 ENCOUNTER — Other Ambulatory Visit: Payer: Self-pay | Admitting: Obstetrics and Gynecology

## 2016-09-01 DIAGNOSIS — N6489 Other specified disorders of breast: Secondary | ICD-10-CM

## 2016-09-30 ENCOUNTER — Ambulatory Visit: Payer: Medicare Other

## 2017-02-14 DIAGNOSIS — J3089 Other allergic rhinitis: Secondary | ICD-10-CM | POA: Insufficient documentation

## 2017-05-08 ENCOUNTER — Other Ambulatory Visit: Payer: Self-pay | Admitting: Physical Medicine and Rehabilitation

## 2017-05-08 DIAGNOSIS — M5416 Radiculopathy, lumbar region: Secondary | ICD-10-CM

## 2017-05-23 ENCOUNTER — Ambulatory Visit
Admission: RE | Admit: 2017-05-23 | Discharge: 2017-05-23 | Disposition: A | Discharge status: Home / Self Care | Payer: Medicare Other | Source: Ambulatory Visit | Attending: Physical Medicine and Rehabilitation | Admitting: Physical Medicine and Rehabilitation

## 2017-05-23 DIAGNOSIS — M5126 Other intervertebral disc displacement, lumbar region: Secondary | ICD-10-CM | POA: Insufficient documentation

## 2017-05-23 DIAGNOSIS — M5416 Radiculopathy, lumbar region: Secondary | ICD-10-CM | POA: Diagnosis present

## 2017-05-23 DIAGNOSIS — M419 Scoliosis, unspecified: Secondary | ICD-10-CM | POA: Insufficient documentation

## 2017-05-23 DIAGNOSIS — M48062 Spinal stenosis, lumbar region with neurogenic claudication: Secondary | ICD-10-CM | POA: Diagnosis present

## 2017-05-23 DIAGNOSIS — M47816 Spondylosis without myelopathy or radiculopathy, lumbar region: Secondary | ICD-10-CM | POA: Diagnosis not present

## 2017-06-08 ENCOUNTER — Other Ambulatory Visit: Payer: Self-pay | Admitting: Neurosurgery

## 2017-06-08 ENCOUNTER — Ambulatory Visit
Admission: RE | Admit: 2017-06-08 | Discharge: 2017-06-08 | Disposition: A | Discharge status: Home / Self Care | Payer: Medicare Other | Source: Ambulatory Visit | Attending: Neurosurgery | Admitting: Neurosurgery

## 2017-06-08 DIAGNOSIS — M4157 Other secondary scoliosis, lumbosacral region: Secondary | ICD-10-CM | POA: Insufficient documentation

## 2017-06-08 DIAGNOSIS — M5136 Other intervertebral disc degeneration, lumbar region: Secondary | ICD-10-CM | POA: Insufficient documentation

## 2017-06-08 DIAGNOSIS — M4316 Spondylolisthesis, lumbar region: Secondary | ICD-10-CM | POA: Diagnosis not present

## 2017-06-30 ENCOUNTER — Encounter: Payer: Self-pay | Admitting: *Deleted

## 2017-06-30 ENCOUNTER — Ambulatory Visit
Admission: EM | Admit: 2017-06-30 | Discharge: 2017-06-30 | Disposition: A | Discharge status: Home / Self Care | Payer: Medicare Other | Attending: Family Medicine | Admitting: Family Medicine

## 2017-06-30 DIAGNOSIS — B029 Zoster without complications: Secondary | ICD-10-CM | POA: Diagnosis not present

## 2017-06-30 MED ORDER — HYDROCODONE-ACETAMINOPHEN 5-325 MG PO TABS: 1.0000 | ORAL_TABLET | Freq: Four times a day (QID) | ORAL | 0 refills | Status: DC | PRN

## 2017-06-30 MED ORDER — FAMCICLOVIR 500 MG PO TABS: 500.0000 mg | ORAL_TABLET | Freq: Three times a day (TID) | ORAL | 0 refills | Status: DC

## 2017-06-30 NOTE — ED Provider Notes (Signed)
[facility]    CSN: [id number] Arrival date & time: 06/30/17  1714     History   Chief Complaint Chief Complaint  Patient presents with  . Herpes Zoster    HPI Lori Trevino is a 66 y.o. female.   HPI  This a 66 year old female who presents with a rash that has started on her right flank. She states that one week ago she noticed pain in this area and 2 days ago started having the eruption of the rash. Is isolated to this one area. Besides the patch that she has ( see photo for detailed) there is no other rash noticed along the entire dermatome. She has been under a great deal of stress lately with her son being diagnosed with a glial blastoma and Parkinson's disease.        Past Medical History:  Diagnosis Date  . Hepatitis    hepatitis B  . History of hiatal hernia   . Hypertension   . PONV (postoperative nausea and vomiting)     Patient Active Problem List   Diagnosis Date Noted  . Post-operative state 02/08/2016    Past Surgical History:  Procedure Laterality Date  . AXILLARY SURGERY Right 2008  . CHOLECYSTECTOMY    . CYSTOCELE REPAIR N/A 02/08/2016   Procedure: ANTERIOR REPAIR (CYSTOCELE);  Surgeon: Boykin Nearing, MD;  Location: [facility];  Service: Gynecology;  Laterality: N/A;  . HERNIA REPAIR    . LAPAROSCOPIC GASTRIC SLEEVE RESECTION WITH HIATAL HERNIA REPAIR    . TONSILLECTOMY    . TUBAL LIGATION    . VAGINAL HYSTERECTOMY N/A 02/08/2016   Procedure: HYSTERECTOMY VAGINAL WITH BSO;  Surgeon: Boykin Nearing, MD;  Location: [facility];  Service: Gynecology;  Laterality: N/A;  . VARICOSE VEIN SURGERY      OB History    No data available       Home Medications    Prior to Admission medications   Medication Sig Start Date End Date Taking? Authorizing Provider  B Complex-Biotin-FA (B-COMPLEX PO) Take 1 tablet by mouth daily.   Yes [provider]  Calcium Citrate-Vitamin D (CALCIUM CITRATE + D3 PO) Take 1 tablet by mouth  daily.   Yes [provider]  cyclobenzaprine (FLEXERIL) 10 MG tablet Take 5-10 mg by mouth 3 (three) times daily as needed for muscle spasms.   Yes [provider]  eszopiclone (LUNESTA) 1 MG TABS tablet Take 1 mg by mouth at bedtime as needed for sleep. Take immediately before bedtime   Yes [provider]  loratadine (CLARITIN) 10 MG tablet Take 10 mg by mouth daily as needed for allergies.   Yes [provider]  famciclovir (FAMVIR) 500 MG tablet Take 1 tablet (500 mg total) by mouth 3 (three) times daily. 06/30/17   Lorin Picket, PA-C  HYDROcodone-acetaminophen (NORCO/VICODIN) 5-325 MG tablet Take 1-2 tablets by mouth every 6 (six) hours as needed. 06/30/17   Lorin Picket, PA-C  Multiple Vitamin (ONE-A-DAY 55 PLUS PO) Take 1 tablet by mouth daily.    [provider]    Family History Family History  Problem Relation Age of Onset  . Breast cancer Neg Hx     Social History Social History  Substance Use Topics  . Smoking status: Former Research scientist (life sciences)  . Smokeless tobacco: Never Used  . Alcohol use 0.0 oz/week     Comment: wine occasionally     Allergies   Neurontin [gabapentin]   Review of Systems Review of  Systems  Constitutional: Positive for activity change. Negative for appetite change, chills, fatigue and fever.  Skin: Positive for rash.  All other systems reviewed and are negative.    Physical Exam Triage Vital Signs ED Triage Vitals  Enc Vitals Group     BP 06/30/17 1740 (!) 160/86     Pulse Rate 06/30/17 1740 78     Resp 06/30/17 1740 12     Temp 06/30/17 1740 97.7 F (36.5 C)     Temp Source 06/30/17 1740 Oral     SpO2 06/30/17 1740 99 %     Weight 06/30/17 1732 144 lb (65.3 kg)     Height 06/30/17 1732 5\' 2"  (1.575 m)     Head Circumference --      Peak Flow --      Pain Score 06/30/17 1733 6     Pain Loc --      Pain Edu? --      Excl. in [place]? --    No data found.   Updated Vital Signs BP (!) 160/86  (BP Location: Left Arm)   Pulse 78   Temp 97.7 F (36.5 C) (Oral)   Resp 12   Ht 5\' 2"  (7.782 m)   Wt 144 lb (65.3 kg)   SpO2 99%   BMI 26.34 kg/m   Visual Acuity Right Eye Distance:   Left Eye Distance:   Bilateral Distance:    Right Eye Near:   Left Eye Near:    Bilateral Near:     Physical Exam  Constitutional: She is oriented to person, place, and time. She appears well-developed and well-nourished. No distress.  HENT:  Head: Normocephalic.  Eyes: Pupils are equal, round, and reactive to light.  Neck: Normal range of motion.  Musculoskeletal: Normal range of motion.  Neurological: She is alert and oriented to person, place, and time.  Skin: Skin is warm and dry. Rash noted. She is not diaphoretic.  Refer to photos for detail.  Psychiatric: She has a normal mood and affect. Her behavior is normal. Judgment and thought content normal.  Nursing note and vitals reviewed.      UC Treatments / Results  Labs (all labs ordered are listed, but only abnormal results are displayed) Labs Reviewed - No data to display  EKG  EKG Interpretation None       Radiology No results found.  Procedures Procedures (including critical care time)  Medications Ordered in UC Medications - No data to display   Initial Impression / Assessment and Plan / UC Course  I have reviewed the triage vital signs and the nursing notes.  Pertinent labs & imaging results that were available during my care of the patient were reviewed by me and considered in my medical decision making (see chart for details).     Plan: 1. Test/x-ray results and diagnosis reviewed with patient 2. rx as per orders; risks, benefits, potential side effects reviewed with patient 3. Recommend supportive treatment with keeping covered. He should continue this until they're crusted over. Start her on Famvir for the next 7 days. We talked briefly about postherpetic neuralgia. She should follow-up with her primary  care. 4. F/u prn if symptoms worsen or don't improve   Final Clinical Impressions(s) / UC Diagnoses   Final diagnoses:  Herpes zoster without complication    New Prescriptions Discharge Medication List as of 06/30/2017  7:18 PM    START taking these medications   Details  famciclovir (FAMVIR) 500 MG tablet  Take 1 tablet (500 mg total) by mouth 3 (three) times daily., Starting Fri 06/30/2017, Print         Controlled Substance Prescriptions [place] Controlled Substance Registry consulted? Consulted the [place] CSR oozing that the patient does receive 70 Vicodin a month by the same prescriber. States that she has no more left and has a schedule refill on October 5. Because of the pain from the shingles and her previous gastric bypass surgery not allowing her to have nonsteroidal anti-inflammatory drugs I decided to give her a few and she should contact her prescribing physician tomorrow to see if she may have more prescribed by him within his her usual October 5.   Lorin Picket, PA-C 06/30/17 1931

## 2017-06-30 NOTE — ED Triage Notes (Signed)
Pt started having pain to her right lower waste side a week ago and yesterday she noticed a rash.

## 2017-08-14 ENCOUNTER — Ambulatory Visit: Payer: Self-pay | Admitting: Family Medicine

## 2017-09-06 ENCOUNTER — Ambulatory Visit: Payer: Self-pay | Admitting: Family Medicine

## 2017-09-13 ENCOUNTER — Ambulatory Visit: Payer: Self-pay | Admitting: Family Medicine

## 2017-09-15 ENCOUNTER — Encounter: Payer: Self-pay | Admitting: Gynecology

## 2017-09-15 ENCOUNTER — Ambulatory Visit: Payer: Medicare Other

## 2017-09-15 ENCOUNTER — Ambulatory Visit
Admission: EM | Admit: 2017-09-15 | Discharge: 2017-09-15 | Disposition: A | Discharge status: Home / Self Care | Payer: Medicare Other | Attending: Family Medicine | Admitting: Family Medicine

## 2017-09-15 ENCOUNTER — Other Ambulatory Visit: Payer: Self-pay

## 2017-09-15 DIAGNOSIS — M79642 Pain in left hand: Secondary | ICD-10-CM | POA: Diagnosis present

## 2017-09-15 DIAGNOSIS — S60042A Contusion of left ring finger without damage to nail, initial encounter: Secondary | ICD-10-CM | POA: Insufficient documentation

## 2017-09-15 DIAGNOSIS — M79645 Pain in left finger(s): Secondary | ICD-10-CM

## 2017-09-15 DIAGNOSIS — S6000XA Contusion of unspecified finger without damage to nail, initial encounter: Secondary | ICD-10-CM

## 2017-09-15 DIAGNOSIS — X58XXXA Exposure to other specified factors, initial encounter: Secondary | ICD-10-CM | POA: Diagnosis not present

## 2017-09-15 NOTE — Discharge Instructions (Signed)
Rest. Ice. Elevate.  Follow up with your primary care physician this week as needed. Return to Urgent care for new or worsening concerns.

## 2017-09-15 NOTE — ED Provider Notes (Signed)
[facility] ____________________________________________  Time seen: Approximately 4:34 PM  I have reviewed the triage vital signs and the nursing notes.   HISTORY  Chief Complaint Hand Injury   HPI Lori Trevino is a 66 y.o. female presenting for evaluation of left hand pain.  Patient reports that yesterday afternoon she accidentally hit her left proximal third phalanx on the edge of a dryer causing some mild pain.  States that she was able to continue to work outside afterwards, but reports that she had increased pain and swelling the same area this morning upon wakening.  Reports that she does take chronic pain medicine daily, no over-the-counter medications taken in addition today.  States pain is worse with direct palpation and bending.  Denies paresthesias, decreased range of motion or decreased strength.  Reports otherwise feels well and denies other pain or complaints.  States mild pain at this time, worse with direct palpation. Denies recent sickness.   Ezequiel Kayser, MD: PCP   Past Medical History:  Diagnosis Date  . Hepatitis    hepatitis B  . History of hiatal hernia   . Hypertension   . PONV (postoperative nausea and vomiting)     Patient Active Problem List   Diagnosis Date Noted  . Post-operative state 02/08/2016    Past Surgical History:  Procedure Laterality Date  . AXILLARY SURGERY Right 2008  . CHOLECYSTECTOMY    . CYSTOCELE REPAIR N/A 02/08/2016   Procedure: ANTERIOR REPAIR (CYSTOCELE);  Surgeon: Boykin Nearing, MD;  Location: [facility] OR[facility];  Service: Gynecology;  Laterality: N/A;  . HERNIA REPAIR    . LAPAROSCOPIC GASTRIC SLEEVE RESECTION WITH HIATAL HERNIA REPAIR    . TONSILLECTOMY    . TUBAL LIGATION    . VAGINAL HYSTERECTOMY N/A 02/08/2016   Procedure: HYSTERECTOMY VAGINAL WITH BSO;  Surgeon: Boykin Nearing, MD;  Location: [facility] OR[facility];  Service: Gynecology;  Laterality: N/A;  . VARICOSE VEIN SURGERY       No current  facility-administered medications for this encounter.   Current Outpatient Medications:  .  B Complex-Biotin-FA (B-COMPLEX PO), Take 1 tablet by mouth daily., Disp: , Rfl:  .  Calcium Citrate-Vitamin D (CALCIUM CITRATE + D3 PO), Take 1 tablet by mouth daily., Disp: , Rfl:  .  cyclobenzaprine (FLEXERIL) 10 MG tablet, Take 5-10 mg by mouth 3 (three) times daily as needed for muscle spasms., Disp: , Rfl:  .  eszopiclone (LUNESTA) 1 MG TABS tablet, Take 1 mg by mouth at bedtime as needed for sleep. Take immediately before bedtime, Disp: , Rfl:  .  HYDROcodone-acetaminophen (NORCO/VICODIN) 5-325 MG tablet, Take 1-2 tablets by mouth every 6 (six) hours as needed., Disp: 10 tablet, Rfl: 0 .  loratadine (CLARITIN) 10 MG tablet, Take 10 mg by mouth daily as needed for allergies., Disp: , Rfl:  .  Multiple Vitamin (ONE-A-DAY 55 PLUS PO), Take 1 tablet by mouth daily., Disp: , Rfl:   Allergies Neurontin [gabapentin]  Family History  Problem Relation Age of Onset  . Stroke Mother   . Cancer Father   . Breast cancer Neg Hx     Social History Social History   Tobacco Use  . Smoking status: Former Research scientist (life sciences)  . Smokeless tobacco: Never Used  Substance Use Topics  . Alcohol use: Yes    Alcohol/week: 0.0 oz    Comment: wine occasionally  . Drug use: No    Review of Systems Constitutional: No fever/chills Cardiovascular: Denies chest pain. Respiratory: Denies shortness of breath. Musculoskeletal: Positive  left third finger pain. Skin: Negative for rash.  ____________________________________________   PHYSICAL EXAM:  VITAL SIGNS: ED Triage Vitals  Enc Vitals Group     BP 09/15/17 1526 (!) 157/88     Pulse Rate 09/15/17 1526 78     Resp 09/15/17 1526 16     Temp 09/15/17 1526 97.6 F (36.4 C)     Temp Source 09/15/17 1526 Oral     SpO2 09/15/17 1526 100 %     Weight 09/15/17 1528 145 lb (65.8 kg)     Height 09/15/17 1528 5\' 3"  (1.6 m)     Head Circumference --      Peak Flow --       Pain Score 09/15/17 1529 7     Pain Loc --      Pain Edu? --      Excl. in [place]? --     Constitutional: Alert and oriented. Well appearing and in no acute distress. Cardiovascular: Normal rate, regular rhythm. Grossly normal heart sounds.  Good peripheral circulation. Respiratory: Normal respiratory effort without tachypnea nor retractions. Breath sounds are clear and equal bilaterally. No wheezes, rales, rhonchi. Musculoskeletal: Steady gait.  Left proximal phalanx and third MCP joint mild to moderate tenderness to direct palpation, mild localized swelling, no ecchymosis, skin intact, no erythema, full range of motion present, pain increases with bending, good resisted distal flexion and extension, no motor or tendon deficit, left hand otherwise nontender.  Bilateral distal radial pulses equal and easily palpated.  Neurologic:  Normal speech and language. Speech is normal. No gait instability.  Skin:  Skin is warm, dry and intact. No rash noted. Psychiatric: Mood and affect are normal. Speech and behavior are normal. Patient exhibits appropriate insight and judgment   ___________________________________________   LABS (all labs ordered are listed, but only abnormal results are displayed)  Labs Reviewed - No data to display ____________________________________________  RADIOLOGY  Dg Finger Middle Left  Result Date: 09/15/2017 CLINICAL DATA:  Injury.  Pain left third MCP joint EXAM: LEFT MIDDLE FINGER 2+V COMPARISON:  None. FINDINGS: There is no evidence of fracture or dislocation. There is no evidence of arthropathy or other focal bone abnormality. Soft tissues are unremarkable. IMPRESSION: Negative. Electronically Signed   By: Rolm Baptise M.D.   On: 09/15/2017 16:10   ____________________________________________   PROCEDURES Procedures   INITIAL IMPRESSION / ASSESSMENT AND PLAN / ED COURSE  Pertinent labs & imaging results that were available during my care of the patient were  reviewed by me and considered in my medical decision making (see chart for details).  Well-appearing patient.  No acute distress.  Presenting for evaluation of left third finger pain post mechanical injury that occurred yesterday.  Left third digit x-ray negative per radiologist and reviewed by myself.  Suspect contusion injury.  Encouraged rest, ice, elevation and supportive care.  Patient states that she has pain medication at home and take as needed.  Discussed follow up with Primary care physician this week as needed. Discussed follow up and return parameters including no resolution or any worsening concerns. Patient verbalized understanding and agreed to plan.   ____________________________________________   FINAL CLINICAL IMPRESSION(S) / ED DIAGNOSES  Final diagnoses:  Contusion of finger of left hand, unspecified finger, initial encounter     ED Discharge Orders    None       Note: This dictation was prepared with Dragon dictation along with smaller phrase technology. Any transcriptional errors that result from this process are  unintentional.         Marylene Land, NP 09/15/17 [phone]

## 2017-09-15 NOTE — ED Triage Notes (Signed)
Per patient bump her left hand on dryer at home. Patient c/o left middle finger pain.

## 2017-10-02 ENCOUNTER — Ambulatory Visit
Admission: EM | Admit: 2017-10-02 | Discharge: 2017-10-02 | Disposition: A | Discharge status: Home / Self Care | Payer: Medicare Other | Attending: Emergency Medicine | Admitting: Emergency Medicine

## 2017-10-02 ENCOUNTER — Other Ambulatory Visit: Payer: Self-pay

## 2017-10-02 DIAGNOSIS — L03012 Cellulitis of left finger: Secondary | ICD-10-CM

## 2017-10-02 MED ORDER — DOXYCYCLINE HYCLATE 100 MG PO CAPS: 100.0000 mg | ORAL_CAPSULE | Freq: Two times a day (BID) | ORAL | 0 refills | Status: AC

## 2017-10-02 NOTE — ED Provider Notes (Signed)
HPI  SUBJECTIVE:  Lori Trevino is a left-handed 66 y.o. female who presents with left middle finger erythema, pain described as constant tenderness, swelling at the proximal phalanx of the left middle finger.  States that she hit her finger 2 weeks ago on a dryer. Seen here on 12/14 for this, had a negative x-ray. Thought to have a contusion, sent home with supportive care.  She tried ice, Norco 2 tabs once daily that she takes chronically for scoliosis.  She denies taking any other Tylenol during the day.  No alleviating factors.  Symptoms worse with palpation.  States that she cannot take NSAIDs secondary to stomach surgery.  She has a past medical history of hepatitis B, hypertension, refuses to take medications for this, scoliosis, spinal stenosis, sciatica.  No history of diabetes.  GMW:NUUVO, Shanon Brow, MD   Past Medical History:  Diagnosis Date  . Hepatitis    hepatitis B  . History of hiatal hernia   . Hypertension   . PONV (postoperative nausea and vomiting)     Past Surgical History:  Procedure Laterality Date  . AXILLARY SURGERY Right 2008  . CHOLECYSTECTOMY    . CYSTOCELE REPAIR N/A 02/08/2016   Procedure: ANTERIOR REPAIR (CYSTOCELE);  Surgeon: Boykin Nearing, MD;  Location: [facility] ORS;  Service: Gynecology;  Laterality: N/A;  . HERNIA REPAIR    . LAPAROSCOPIC GASTRIC SLEEVE RESECTION WITH HIATAL HERNIA REPAIR    . TONSILLECTOMY    . TUBAL LIGATION    . VAGINAL HYSTERECTOMY N/A 02/08/2016   Procedure: HYSTERECTOMY VAGINAL WITH BSO;  Surgeon: Boykin Nearing, MD;  Location: [facility] ORS;  Service: Gynecology;  Laterality: N/A;  . VARICOSE VEIN SURGERY      Family History  Problem Relation Age of Onset  . Stroke Mother   . Cancer Father   . Breast cancer Neg Hx     Social History   Tobacco Use  . Smoking status: Former Research scientist (life sciences)  . Smokeless tobacco: Never Used  Substance Use Topics  . Alcohol use: Yes    Alcohol/week: 0.0 oz    Comment: wine occasionally  . Drug  use: No    No current facility-administered medications for this encounter.   Current Outpatient Medications:  .  B Complex-Biotin-FA (B-COMPLEX PO), Take 1 tablet by mouth daily., Disp: , Rfl:  .  Calcium Citrate-Vitamin D (CALCIUM CITRATE + D3 PO), Take 1 tablet by mouth daily., Disp: , Rfl:  .  cyclobenzaprine (FLEXERIL) 10 MG tablet, Take 5-10 mg by mouth 3 (three) times daily as needed for muscle spasms., Disp: , Rfl:  .  eszopiclone (LUNESTA) 1 MG TABS tablet, Take 1 mg by mouth at bedtime as needed for sleep. Take immediately before bedtime, Disp: , Rfl:  .  HYDROcodone-acetaminophen (NORCO/VICODIN) 5-325 MG tablet, Take 1-2 tablets by mouth every 6 (six) hours as needed., Disp: 10 tablet, Rfl: 0 .  loratadine (CLARITIN) 10 MG tablet, Take 10 mg by mouth daily as needed for allergies., Disp: , Rfl:  .  Multiple Vitamin (ONE-A-DAY 55 PLUS PO), Take 1 tablet by mouth daily., Disp: , Rfl:  .  doxycycline (VIBRAMYCIN) 100 MG capsule, Take 1 capsule (100 mg total) by mouth 2 (two) times daily for 7 days., Disp: 14 capsule, Rfl: 0  Allergies  Allergen Reactions  . Neurontin [Gabapentin] Swelling     ROS  As noted in HPI.   Physical Exam  BP (!) 152/88 (BP Location: Right Arm)   Pulse 66   Temp 98.2  F (36.8 C) (Oral)   Resp 15   Ht 5\' 3"  (1.6 m)   Wt 145 lb (65.8 kg)   SpO2 100%   BMI 25.69 kg/m   Constitutional: Well developed, well nourished, no acute distress Eyes:  EOMI, conjunctiva normal bilaterally HENT: Normocephalic, atraumatic,mucus membranes moist Respiratory: Normal inspiratory effort Cardiovascular: Normal rate GI: nondistended skin: No rash, skin intact Musculoskeletal: Tender, swollen, erythematous left proximal phalanx of the middle finger.  No DIP, PIP joint swelling, tenderness.  No other finger erythema, edema, tenderness.  No tenderness along the flexor tendons.  Two-point discrimination intact.  FDP, FDS intact.  No other swelling of the  hand.     Neurologic: Alert & oriented x 3, no focal neuro deficits Psychiatric: Speech and behavior appropriate   ED Course   Medications - No data to display  No orders of the defined types were placed in this encounter.   No results found for this or any previous visit (from the past 24 hour(s)). No results found.  ED Clinical Impression  Cellulitis of left middle finger   ED Assessment/Plan  [place] notes reviewed.  As noted in HPI.  Presentation consistent with a cellulitis.  No evidence of flexor tenosynovitis.  Deferred x-ray because she had negative x-ray last time and she has had no further trauma.  Home with doxycycline 100 mg p.o. twice daily for 7 days, continue Norco as prescribed at night, may take an additional 2 g of Tylenol during the day.  Follow-up here or with a primary care physician of her choice if not getting any better, to the ER if she gets worse.  Will provide a primary care referral and primary care referral list.  Discussed  MDM, plan and followup with patient. Discussed sn/sx that should prompt return to the ED. patient agrees with plan.   Meds ordered this encounter  Medications  . doxycycline (VIBRAMYCIN) 100 MG capsule    Sig: Take 1 capsule (100 mg total) by mouth 2 (two) times daily for 7 days.    Dispense:  14 capsule    Refill:  0    *This clinic note was created using Lobbyist. Therefore, there may be occasional mistakes despite careful proofreading.   ?   [name], MD 10/02/17 2005

## 2017-10-02 NOTE — ED Triage Notes (Signed)
Patient complains of left middle finger pain that occurred when she hit it on a dryer. Patient states that she was seen here on 12/14. Patient states that she has not been able to see relief since visit. Patient states that Patient presents with swelling and redness in finger. Patient states that she believes that finger is broken.

## 2017-10-02 NOTE — Discharge Instructions (Signed)
You do need to follow-up with a primary care doctor for monitoring of your blood pressure.  Start using a blood pressure cuff at home to make sure that it is normal.  Normal readings are below 140/90.  Here is a list of primary care providers who are taking new patients:  Dr. Otilio Miu, Dr. [name] [address] [place] [address] [place] [address]  [phone]  [facility] [address] [place], [place] [address] [phone]  [facility]  [phone] [place], [place] [address]  Here are clinics/ other resources who will see you if you do not have insurance. Some have certain criteria that you must meet. Call them and find out what they are:  [facility]: [address] [place], [place] [address] Phone: [phone] Hours: First and Third Saturdays of each Month, 9 a.m. - 1 p.m.  [facility]: [address], [place], [place], [place] [address] Phone: [phone] Hours: Tuesday, 4 p.m. - 8 p.m. Thursday, 1 p.m. - 8 p.m. Wednesday, 9 a.m. - [facility] [address] [place], [place] [address] Phone: [phone] Pharmacy Phone Number: [phone] Dental Phone Number: [phone] [place] Help: [phone]  Dental Hours: Monday - Thursday, 8 a.m. - 6 p.m.  [facility] [address] [place], [place] [address] Phone: [phone] Pharmacy Phone Number: [phone] [facility] Insurance Help: [phone]  [facility] [address] [place], [place] [address] Phone: [phone] Pharmacy Phone Number: [phone] [facility] Insurance Help: [phone]  [facility] [address] [place], [place] [address] Phone: [phone] [facility] Insurance Help: [phone]   [facility] [place], [place] [address] Phone:  [phone]  Go to www.goodrx.com to look up your medications. This will give you a list of where you can find your prescriptions at the most affordable prices. Or ask the pharmacist what the cash price is, or if they have any other discount programs available to help make your medication more affordable. This can be less expensive than what you would pay with insurance.

## 2017-10-06 ENCOUNTER — Telehealth: Payer: Self-pay | Admitting: *Deleted

## 2017-10-06 NOTE — Telephone Encounter (Addendum)
Patient called with concerns that her finger infection has not improved while taking Doxycycline prescribed by Dr Alphonzo Cruise 4 days ago. Advised patient to come in and be seen by a provider. PAtient confirmed understanding of information.

## 2017-10-07 ENCOUNTER — Ambulatory Visit: Payer: Medicare Other

## 2017-10-07 ENCOUNTER — Other Ambulatory Visit: Payer: Self-pay

## 2017-10-07 ENCOUNTER — Encounter: Payer: Self-pay | Admitting: Emergency Medicine

## 2017-10-07 ENCOUNTER — Ambulatory Visit
Admission: EM | Admit: 2017-10-07 | Discharge: 2017-10-07 | Disposition: A | Discharge status: Home / Self Care | Payer: Medicare Other | Attending: Emergency Medicine | Admitting: Emergency Medicine

## 2017-10-07 DIAGNOSIS — Z90710 Acquired absence of both cervix and uterus: Secondary | ICD-10-CM | POA: Diagnosis not present

## 2017-10-07 DIAGNOSIS — Z8619 Personal history of other infectious and parasitic diseases: Secondary | ICD-10-CM | POA: Diagnosis not present

## 2017-10-07 DIAGNOSIS — Z888 Allergy status to other drugs, medicaments and biological substances status: Secondary | ICD-10-CM | POA: Insufficient documentation

## 2017-10-07 DIAGNOSIS — Z90722 Acquired absence of ovaries, bilateral: Secondary | ICD-10-CM | POA: Insufficient documentation

## 2017-10-07 DIAGNOSIS — Z9049 Acquired absence of other specified parts of digestive tract: Secondary | ICD-10-CM | POA: Diagnosis not present

## 2017-10-07 DIAGNOSIS — I10 Essential (primary) hypertension: Secondary | ICD-10-CM | POA: Insufficient documentation

## 2017-10-07 DIAGNOSIS — L03012 Cellulitis of left finger: Secondary | ICD-10-CM | POA: Insufficient documentation

## 2017-10-07 DIAGNOSIS — M7989 Other specified soft tissue disorders: Secondary | ICD-10-CM | POA: Insufficient documentation

## 2017-10-07 DIAGNOSIS — Z823 Family history of stroke: Secondary | ICD-10-CM | POA: Diagnosis not present

## 2017-10-07 DIAGNOSIS — Z87891 Personal history of nicotine dependence: Secondary | ICD-10-CM | POA: Diagnosis not present

## 2017-10-07 DIAGNOSIS — R229 Localized swelling, mass and lump, unspecified: Secondary | ICD-10-CM | POA: Diagnosis not present

## 2017-10-07 DIAGNOSIS — Z79899 Other long term (current) drug therapy: Secondary | ICD-10-CM | POA: Insufficient documentation

## 2017-10-07 MED ORDER — TRIAMCINOLONE ACETONIDE 0.1 % EX CREA: 1.0000 "application " | TOPICAL_CREAM | Freq: Two times a day (BID) | CUTANEOUS | 0 refills | Status: DC

## 2017-10-07 MED ORDER — HYDROCODONE-ACETAMINOPHEN 5-325 MG PO TABS: 1.0000 | ORAL_TABLET | Freq: Four times a day (QID) | ORAL | 0 refills | Status: DC | PRN

## 2017-10-07 MED ORDER — CEPHALEXIN 500 MG PO CAPS: 500.0000 mg | ORAL_CAPSULE | Freq: Four times a day (QID) | ORAL | 0 refills | Status: AC

## 2017-10-07 MED ORDER — CEFTRIAXONE SODIUM 1 G IJ SOLR
1.0000 g | Freq: Once | INTRAMUSCULAR | Status: AC
  Administered 2017-10-07: 1 g via INTRAMUSCULAR

## 2017-10-07 NOTE — Discharge Instructions (Signed)
Return here or see your primary care physician in 2 days to make sure that you are getting better.  Go immediately to the ED if you get worse.  Finish the doxycycline and start the Keflex.  Use the steroid cream as directed.

## 2017-10-07 NOTE — ED Triage Notes (Signed)
Patient c/o left middle finger swelling and pain that has gotten worse.

## 2017-10-07 NOTE — ED Provider Notes (Signed)
HPI  SUBJECTIVE:  Lori Trevino is a 67 y.o. female who presents with worsening left middle finger swelling, tenderness, erythema of the proximal phalanx.  Describes pain as throbbing, constant.  States that it started after she hit her finger on a door last month around 12/12.  This is a third time patient has been seen at this clinic for the same.  She was initially thought to have a contusion on the first visit, and had an x-ray on the first visit on 12/14 which was negative, she thought to have a cellulitis on the second visit 12/31 and started on doxycycline.  States that doxycycline is not helping although states that she is compliant with this.  She has also been taking Tylenol without improvement in symptoms.  Symptoms are worse with palpation, movement.  She denies repeat trauma.  No lotions, soaps she does not wear any rings on this finger.  No numbness, tingling.  She reports limitation of motion secondary to edema.  Past medical history of hepatitis B, hypertension states it is "white coat".  No history of MRSA, diabetes, immunocompromise, cancer.  NFA:OZHYQ, Shanon Brow, MD    Past Medical History:  Diagnosis Date  . Hepatitis    hepatitis B  . History of hiatal hernia   . Hypertension   . PONV (postoperative nausea and vomiting)     Past Surgical History:  Procedure Laterality Date  . AXILLARY SURGERY Right 2008  . CHOLECYSTECTOMY    . CYSTOCELE REPAIR N/A 02/08/2016   Procedure: ANTERIOR REPAIR (CYSTOCELE);  Surgeon: Boykin Nearing, MD;  Location: [facility] ORS;  Service: Gynecology;  Laterality: N/A;  . HERNIA REPAIR    . LAPAROSCOPIC GASTRIC SLEEVE RESECTION WITH HIATAL HERNIA REPAIR    . TONSILLECTOMY    . TUBAL LIGATION    . VAGINAL HYSTERECTOMY N/A 02/08/2016   Procedure: HYSTERECTOMY VAGINAL WITH BSO;  Surgeon: Boykin Nearing, MD;  Location: [facility] ORS;  Service: Gynecology;  Laterality: N/A;  . VARICOSE VEIN SURGERY      Family History  Problem Relation Age of Onset   . Stroke Mother   . Cancer Father   . Breast cancer Neg Hx     Social History   Tobacco Use  . Smoking status: Former Research scientist (life sciences)  . Smokeless tobacco: Never Used  Substance Use Topics  . Alcohol use: Yes    Alcohol/week: 0.0 oz    Comment: wine occasionally  . Drug use: No     Current Facility-Administered Medications:  .  cefTRIAXone (ROCEPHIN) injection 1 g, 1 g, Intramuscular, Once, [name], MD  Current Outpatient Medications:  .  B Complex-Biotin-FA (B-COMPLEX PO), Take 1 tablet by mouth daily., Disp: , Rfl:  .  Calcium Citrate-Vitamin D (CALCIUM CITRATE + D3 PO), Take 1 tablet by mouth daily., Disp: , Rfl:  .  cephALEXin (KEFLEX) 500 MG capsule, Take 1 capsule (500 mg total) by mouth 4 (four) times daily for 7 days. X 5 days, Disp: 28 capsule, Rfl: 0 .  cyclobenzaprine (FLEXERIL) 10 MG tablet, Take 5-10 mg by mouth 3 (three) times daily as needed for muscle spasms., Disp: , Rfl:  .  doxycycline (VIBRAMYCIN) 100 MG capsule, Take 1 capsule (100 mg total) by mouth 2 (two) times daily for 7 days., Disp: 14 capsule, Rfl: 0 .  eszopiclone (LUNESTA) 1 MG TABS tablet, Take 1 mg by mouth at bedtime as needed for sleep. Take immediately before bedtime, Disp: , Rfl:  .  HYDROcodone-acetaminophen (NORCO/VICODIN) 5-325 MG tablet, Take  1-2 tablets by mouth every 6 (six) hours as needed for moderate pain or severe pain., Disp: 20 tablet, Rfl: 0 .  loratadine (CLARITIN) 10 MG tablet, Take 10 mg by mouth daily as needed for allergies., Disp: , Rfl:  .  Multiple Vitamin (ONE-A-DAY 55 PLUS PO), Take 1 tablet by mouth daily., Disp: , Rfl:  .  triamcinolone cream (KENALOG) 0.1 %, Apply 1 application topically 2 (two) times daily. Apply for 2 weeks. May use on face, Disp: 30 g, Rfl: 0  Allergies  Allergen Reactions  . Neurontin [Gabapentin] Swelling     ROS  As noted in HPI.   Physical Exam  BP (!) 152/84 (BP Location: Right Arm)   Pulse 73   Temp 97.8 F (36.6 C) (Oral)    Resp 16   Wt 145 lb (65.8 kg)   SpO2 100%   BMI 25.69 kg/m   Constitutional: Well developed, well nourished, no acute distress Eyes:  EOMI, conjunctiva normal bilaterally HENT: Normocephalic, atraumatic,mucus membranes moist Respiratory: Normal inspiratory effort Cardiovascular: Normal rate GI: nondistended skin: No rash, skin intact Musculoskeletal: Tender erythema, edema proximal phalanx left middle finger on the dorsal side.  No erythema along the volar aspect.  No tenderness along the flexor tendons.  FDP/FDS intact.  Sensation distally grossly intact.  See picture.     Neurologic: Alert & oriented x 3, no focal neuro deficits Psychiatric: Speech and behavior appropriate   ED Course   Medications  cefTRIAXone (ROCEPHIN) injection 1 g (not administered)    Orders Placed This Encounter  Procedures  . DG Finger Middle Left    Standing Status:   Standing    Number of Occurrences:   1    Order Specific Question:   Reason for Exam (SYMPTOM  OR DIAGNOSIS REQUIRED)    Answer:   pain swelling prox phalanx r/o fx, osteomyelitis    No results found for this or any previous visit (from the past 24 hour(s)). Dg Finger Middle Left  Result Date: 10/07/2017 CLINICAL DATA:  67 year old female with left middle finger swelling, redness and pain EXAM: LEFT MIDDLE FINGER 2+V COMPARISON:  Prior radiographs 09/15/2017 FINDINGS: No evidence of acute fracture, malalignment or radiopaque foreign body. Progressive swelling of the proximal long finger surrounding the proximal phalanx compared to prior imaging. No conventional radiographic findings of osteomyelitis. The remaining visualized bones and joints are unremarkable. IMPRESSION: 1. Progressive soft tissue swelling of the proximal long finger, particularly surrounding the proximal phalanx without evidence of fracture, bony abnormality or retained foreign body. Electronically Signed   By: Jacqulynn Cadet M.D.   On: 10/07/2017 08:52    ED  Clinical Impression  Localized soft tissue swelling  Cellulitis of finger of left hand   ED Assessment/Plan   Previous records reviewed, as noted in HPI  In the ddx is contact dermatitis, osteomyelitis, fracture, cellulitis, erysipilas. .  We will repeat finger x-ray to evaluate for fracture, osteomyelitis.  Not appear to be flexor tenosynovitis.  Will reevaluate.  Imaging independently reviewed.  No fracture, abnormality, foreign body, subcu air per my read.  See radiology report for details. Giving 1 g of Rocephin here today  Unable to log into the [facility] narcotic database.  Plan to send home with Norco for the pain, Keflex for 7 days to cover strep and will send home with a topical steroid for the swelling and have her return to clinic in 2 days for reevaluation. She is to go to the ER if she  gets worse.  Discussed imaging, MDM, plan and followup with patient. Discussed sn/sx that should prompt return to the ED. patient agrees with plan.   Meds ordered this encounter  Medications  . cefTRIAXone (ROCEPHIN) injection 1 g  . cephALEXin (KEFLEX) 500 MG capsule    Sig: Take 1 capsule (500 mg total) by mouth 4 (four) times daily for 7 days. X 5 days    Dispense:  28 capsule    Refill:  0  . triamcinolone cream (KENALOG) 0.1 %    Sig: Apply 1 application topically 2 (two) times daily. Apply for 2 weeks. May use on face    Dispense:  30 g    Refill:  0  . HYDROcodone-acetaminophen (NORCO/VICODIN) 5-325 MG tablet    Sig: Take 1-2 tablets by mouth every 6 (six) hours as needed for moderate pain or severe pain.    Dispense:  20 tablet    Refill:  0    *This clinic note was created using Lobbyist. Therefore, there may be occasional mistakes despite careful proofreading.   ?   [name], MD 10/07/17 1751

## 2017-10-27 ENCOUNTER — Encounter: Payer: Self-pay | Admitting: Family Medicine

## 2017-10-27 ENCOUNTER — Other Ambulatory Visit
Admission: RE | Admit: 2017-10-27 | Discharge: 2017-10-27 | Disposition: A | Discharge status: Home / Self Care | Payer: Medicare Other | Source: Ambulatory Visit | Attending: Family Medicine | Admitting: Family Medicine

## 2017-10-27 ENCOUNTER — Ambulatory Visit (INDEPENDENT_AMBULATORY_CARE_PROVIDER_SITE_OTHER): Payer: Medicare Other | Admitting: Family Medicine

## 2017-10-27 VITALS — BP 142/78 | HR 78 | Resp 16 | Ht 63.0 in | Wt 154.0 lb

## 2017-10-27 DIAGNOSIS — Z1211 Encounter for screening for malignant neoplasm of colon: Secondary | ICD-10-CM | POA: Diagnosis present

## 2017-10-27 DIAGNOSIS — M159 Polyosteoarthritis, unspecified: Secondary | ICD-10-CM | POA: Diagnosis not present

## 2017-10-27 DIAGNOSIS — IMO0002 Reserved for concepts with insufficient information to code with codable children: Secondary | ICD-10-CM

## 2017-10-27 DIAGNOSIS — E782 Mixed hyperlipidemia: Secondary | ICD-10-CM | POA: Diagnosis not present

## 2017-10-27 DIAGNOSIS — Z23 Encounter for immunization: Secondary | ICD-10-CM | POA: Diagnosis not present

## 2017-10-27 DIAGNOSIS — I10 Essential (primary) hypertension: Secondary | ICD-10-CM | POA: Diagnosis not present

## 2017-10-27 DIAGNOSIS — R2681 Unsteadiness on feet: Secondary | ICD-10-CM | POA: Diagnosis not present

## 2017-10-27 DIAGNOSIS — M5416 Radiculopathy, lumbar region: Secondary | ICD-10-CM | POA: Diagnosis not present

## 2017-10-27 DIAGNOSIS — Z1239 Encounter for other screening for malignant neoplasm of breast: Secondary | ICD-10-CM

## 2017-10-27 DIAGNOSIS — Z1231 Encounter for screening mammogram for malignant neoplasm of breast: Secondary | ICD-10-CM | POA: Diagnosis not present

## 2017-10-27 DIAGNOSIS — Z789 Other specified health status: Secondary | ICD-10-CM

## 2017-10-27 LAB — COMPREHENSIVE METABOLIC PANEL
ALT: 19 U/L (ref 14–54)
ANION GAP: 7 (ref 5–15)
AST: 23 U/L (ref 15–41)
Albumin: 4.6 g/dL (ref 3.5–5.0)
Alkaline Phosphatase: 49 U/L (ref 38–126)
BUN: 19 mg/dL (ref 6–20)
CHLORIDE: 102 mmol/L (ref 101–111)
CO2: 28 mmol/L (ref 22–32)
Calcium: 9.5 mg/dL (ref 8.9–10.3)
Creatinine, Ser: 0.58 mg/dL (ref 0.44–1.00)
GFR calc non Af Amer: >60 mL/min (ref >60)
Glucose, Bld: 102 mg/dL — ABNORMAL HIGH (ref 65–99)
Potassium: 4.7 mmol/L (ref 3.5–5.1)
SODIUM: 137 mmol/L (ref 135–145)
Total Bilirubin: 0.6 mg/dL (ref 0.3–1.2)
Total Protein: 7.6 g/dL (ref 6.5–8.1)

## 2017-10-27 LAB — LIPID PANEL
CHOL/HDL RATIO: 2.1 ratio
CHOLESTEROL: 244 mg/dL — AB (ref 0–200)
HDL: 116 mg/dL (ref >40)
LDL Cholesterol: 116 mg/dL — ABNORMAL HIGH (ref 0–99)
Triglycerides: 59 mg/dL (ref <150)
VLDL: 12 mg/dL (ref 0–40)

## 2017-10-27 LAB — CBC
HCT: 41.6 % (ref 35.0–47.0)
Hemoglobin: 14 g/dL (ref 12.0–16.0)
MCH: 31.5 pg (ref 26.0–34.0)
MCHC: 33.7 g/dL (ref 32.0–36.0)
MCV: 93.4 fL (ref 80.0–100.0)
Platelets: 246 10*3/uL (ref 150–440)
RBC: 4.46 MIL/uL (ref 3.80–5.20)
RDW: 12.6 % (ref 11.5–14.5)
WBC: 4.7 10*3/uL (ref 3.6–11.0)

## 2017-10-27 LAB — VITAMIN B12: VITAMIN B 12: 558 pg/mL (ref 180–914)

## 2017-10-27 MED ORDER — DULOXETINE HCL 20 MG PO CPEP: 20.0000 mg | ORAL_CAPSULE | Freq: Every day | ORAL | 2 refills | Status: DC

## 2017-10-27 MED ORDER — NAPROXEN 500 MG PO TABS: 500.0000 mg | ORAL_TABLET | Freq: Two times a day (BID) | ORAL | 0 refills | Status: DC

## 2017-10-27 NOTE — Progress Notes (Signed)
Date:  10/27/2017   Name:  Lori Trevino   DOB:  [birth year]   MRN:  [id number]  PCP:  [name], MD    Chief Complaint: Establish Care and Hand Pain (3rd digit on left hand. Hit on dryer about 2 weeks ago and entire hand was swelling. Went to [place]  and had xrays and Abx and still no better. Not cellulitus like they said is what patient thinks. )   History of Present Illness:  This is a 67 y.o. female seen for initial visit. Hx lumbar radiculopathy, followed by Dr. Sharlet Salina, getting steroid injections, last one withheld due to possible cellulitis L middle finger, has been seen for this at [facility] three times since 09/15/17, XRs ok, injured in dryer door. Has received abxs x 2 courses, Voltaren gel made burn. Takes Norco bid and Flexeril prn for back pain, gaba/Lyrica ineffective, has not tried Cymbalta. 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 Had shingles last year but declines zoster imm. Father died unknown ca 63, mother died CVA 55, brother died etoh 71. Tdap 01-28-14, no flu imm this season, no pneumo or zoster imms, last mammo Jan 29, 2016, no colonoscopy, requests Cologuard.  Review of Systems:  Review of Systems  Constitutional: Negative for chills and fever.  HENT: Negative for ear pain, sinus pain and trouble swallowing.   Eyes: Negative for pain.  Respiratory: Negative for cough and shortness of breath.   Cardiovascular: Negative for chest pain and leg swelling.  Gastrointestinal: Negative for abdominal pain.  Genitourinary: Negative for difficulty urinating.  Neurological: Negative for syncope and light-headedness.  Hematological: Negative for adenopathy.    Patient Active Problem List   Diagnosis Date Noted  . Gait instability 10/27/2017  . Adnexal mass 07/12/2017  . Seasonal allergic rhinitis due to pollen 02/14/2017  . Anxiety about health 12/10/2015  . Mixed hyperlipidemia 12/08/2015  . 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 Chronic pain 05/02/2015  .  Osteoarthritis 06/16/2014  . Radiculopathy of lumbar region 06/16/2014  . 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 Chronic insomnia 06/12/2014  . Vitamin D deficiency 05/31/2014  . Thoracic or lumbosacral neuritis or radiculitis 02/13/2014  . Neuralgia and neuritis 06/25/2013  . Arthritis, midfoot 06/18/2013  . Left foot pain 06/12/2013    Prior to Admission medications   Medication Sig Start Date End Date Taking? Authorizing Provider  B Complex-Biotin-FA (B-COMPLEX PO) Take 1 tablet by mouth daily.   Yes [provider]  cyclobenzaprine (FLEXERIL) 10 MG tablet Take 5-10 mg by mouth 3 (three) times daily as needed for muscle spasms.   Yes [provider]  estradiol (ESTRACE) 0.1 MG/GM vaginal cream Place 1 Applicatorful vaginally at bedtime.   Yes [provider]  eszopiclone (LUNESTA) 1 MG TABS tablet Take 1 mg by mouth at bedtime as needed for sleep. Take immediately before bedtime   Yes [provider]  HYDROcodone-acetaminophen (NORCO/VICODIN) 5-325 MG tablet Take 1-2 tablets by mouth every 6 (six) hours as needed for moderate pain or severe pain. 10/07/17  Yes [name], MD  loratadine (CLARITIN) 10 MG tablet Take 10 mg by mouth daily as needed for allergies.   Yes [provider]  Multiple Vitamin (ONE-A-DAY 55 PLUS PO) Take 1 tablet by mouth daily.   Yes [provider]  DULoxetine (CYMBALTA) 20 MG capsule Take 1 capsule (20 mg total) by mouth daily. 10/27/17   [name], MD  naproxen (  NAPROSYN) 500 MG tablet Take 1 tablet (500 mg total) by mouth 2 (two) times daily with a meal. 10/27/17   [name], MD    Allergies  Allergen Reactions  . Metoprolol Other (See Comments)    Caused drowsiness Caused drowsiness Caused drowsiness Caused drowsiness   . Zolpidem Other (See Comments)    sleepwalking Other reaction(s): Other (See  Comments) sleepwalking sleepwalking sleepwalking sleepwalking   . Neurontin [Gabapentin] Swelling    Past Surgical History:  Procedure Laterality Date  . AXILLARY SURGERY Right 2008  . CHOLECYSTECTOMY    . CYSTOCELE REPAIR N/A 02/08/2016   Procedure: ANTERIOR REPAIR (CYSTOCELE);  Surgeon: Boykin Nearing, MD;  Location: [facility] ORS;  Service: Gynecology;  Laterality: N/A;  . HERNIA REPAIR    . LAPAROSCOPIC GASTRIC SLEEVE RESECTION WITH HIATAL HERNIA REPAIR    . TONSILLECTOMY    . TUBAL LIGATION    . VAGINAL HYSTERECTOMY N/A 02/08/2016   Procedure: HYSTERECTOMY VAGINAL WITH BSO;  Surgeon: Boykin Nearing, MD;  Location: [facility] ORS;  Service: Gynecology;  Laterality: N/A;  . VARICOSE VEIN SURGERY      Social History   Tobacco Use  . Smoking status: Former Research scientist (life sciences)  . Smokeless tobacco: Never Used  Substance Use Topics  . Alcohol use: Yes    Alcohol/week: 0.0 oz    Comment: wine occasionally  . Drug use: No    Family History  Problem Relation Age of Onset  . Stroke Mother   . Cancer Father   . Breast cancer Neg Hx     Medication list has been reviewed and updated.  Physical Examination: BP (!) 142/78   Pulse 78   Resp 16   Ht 5\' 3"  (1.6 m)   Wt 154 lb (69.9 kg)   SpO2 98%   BMI 27.28 kg/m   Physical Exam  Constitutional: She appears well-developed and well-nourished.  HENT:  Head: Normocephalic and atraumatic.  Right Ear: External ear normal.  Left Ear: External ear normal.  Nose: Nose normal.  Mouth/Throat: Oropharynx is clear and moist.  TMs clear  Eyes: Conjunctivae and EOM are normal. Pupils are equal, round, and reactive to light.  Neck: Neck supple. No thyromegaly present.  Cardiovascular: Normal rate, regular rhythm and normal heart sounds.  Pulmonary/Chest: Effort normal and breath sounds normal.  Abdominal: Soft. She exhibits no distension and no mass. There is no tenderness.  Musculoskeletal: She exhibits no edema.  Edema/erythema over R  middle finger dorsal proximal phalanx  Lymphadenopathy:    She has no cervical adenopathy.  Neurological: She is alert.  Romberg wobbly, gait unsteady  Skin: Skin is warm and dry.  Psychiatric: She has a normal mood and affect. Her behavior is normal.  Nursing note and vitals reviewed.   Assessment and Plan:  1. Radiculopathy of lumbar region Poor control, gaba/Lyrica ineffective, begin Cymbalta 20 mg daily, Dr. Sharlet Salina following, continue steroid injections - Comprehensive Metabolic Panel (CMET) - CBC  2. Osteoarthritis of multiple joints, unspecified osteoarthritis type Trial Naprosyn bid x 1 week  3. Mixed hyperlipidemia - TSH - Lipid Profile  4. Gait instability - B12  5. Body mass index (BMI) greater than 25 S/p gastric sleeve 2015  6. Need for influenza vaccination - Flu vaccine HIGH DOSE PF (Fluzone High dose)  7. Need for pneumococcal vaccination - Pneumococcal conjugate vaccine 13-valent  8. Breast cancer screening - MM Digital Screening; Future  9. Colon cancer screening - Cologuard  Return in about 4 weeks (around 11/24/2017).  One hour spent with pt, over half in counseling  Satira Anis. [facility]  10/27/2017

## 2017-10-28 LAB — TSH: TSH: 0.567 u[IU]/mL (ref 0.350–4.500)

## 2017-10-31 ENCOUNTER — Other Ambulatory Visit: Payer: Self-pay | Admitting: Family Medicine

## 2017-10-31 DIAGNOSIS — N6489 Other specified disorders of breast: Secondary | ICD-10-CM

## 2017-11-16 ENCOUNTER — Telehealth: Payer: Self-pay

## 2017-11-16 NOTE — Telephone Encounter (Signed)
Called pt to sched for AWV w/ [facility]. Unable to LVM due to no answer. Reminder added to notes of f/u appt w/ Dr. Vicente Masson on 11/25/07.

## 2017-11-24 ENCOUNTER — Ambulatory Visit: Payer: Medicare Other | Admitting: Family Medicine

## 2017-11-28 ENCOUNTER — Ambulatory Visit: Payer: Medicare Other | Admitting: Family Medicine

## 2018-01-29 ENCOUNTER — Ambulatory Visit: Payer: Medicare Other | Admitting: Family Medicine

## 2018-02-01 ENCOUNTER — Ambulatory Visit: Payer: Medicare Other | Admitting: Internal Medicine

## 2018-02-02 ENCOUNTER — Encounter: Payer: Self-pay | Admitting: Internal Medicine

## 2018-02-02 ENCOUNTER — Other Ambulatory Visit: Payer: Self-pay | Admitting: Internal Medicine

## 2018-02-03 ENCOUNTER — Encounter: Payer: Self-pay | Admitting: Internal Medicine

## 2018-02-05 ENCOUNTER — Ambulatory Visit (INDEPENDENT_AMBULATORY_CARE_PROVIDER_SITE_OTHER): Payer: Medicare Other | Admitting: Internal Medicine

## 2018-02-05 ENCOUNTER — Encounter: Payer: Self-pay | Admitting: Internal Medicine

## 2018-02-05 VITALS — BP 120/80 | HR 88 | Ht 62.0 in | Wt 154.0 lb

## 2018-02-05 DIAGNOSIS — M5416 Radiculopathy, lumbar region: Secondary | ICD-10-CM

## 2018-02-05 DIAGNOSIS — M7989 Other specified soft tissue disorders: Secondary | ICD-10-CM | POA: Diagnosis not present

## 2018-02-05 DIAGNOSIS — Z1211 Encounter for screening for malignant neoplasm of colon: Secondary | ICD-10-CM | POA: Diagnosis not present

## 2018-02-05 DIAGNOSIS — R7309 Other abnormal glucose: Secondary | ICD-10-CM | POA: Diagnosis not present

## 2018-02-05 DIAGNOSIS — Z1231 Encounter for screening mammogram for malignant neoplasm of breast: Secondary | ICD-10-CM

## 2018-02-05 DIAGNOSIS — Z1239 Encounter for other screening for malignant neoplasm of breast: Secondary | ICD-10-CM

## 2018-02-05 NOTE — Progress Notes (Signed)
Date:  02/05/2018   Name:  Lori Trevino   DOB:  [birth year]   MRN:  [id number]   Chief Complaint: Hand Pain (L) middle finger is swollen at the base of finger. Was seen at urgent care- xrays were done. No fx/ said it was cellulitis. It has "stayed like this since December") Previous patient of Dr. Vicente Masson,  Here for general follow up and finger pain. She is overdue for mammogram and colonoscopy.  We discussed cologuard - info sheet given for her to check on coverage.  HPI Radiculopathy - followed by Dr. Sharlet Salina.  Dr. Vicente Masson added Duloxetine last visit which she is not taking - she thinks she may have had a reaction to the medication.  She is only taking hydrocodone, no nsiads.  Elevated blood glucose - this has been mildly elevated on several occasions.  No hx of DM.  Has gained some weight back since gastric surgery.  Finger pain - started in December after striking it on her dryer at home.  2 xrays have been negative other than soft tissue swelling.  She was treated for cellulitis with no change.  It has persisted and is only painful if she hits it.  At this point she is  Ready for more evaluation.  Review of Systems  Constitutional: Negative for chills, fatigue and fever.  Respiratory: Negative for cough, chest tightness, shortness of breath and wheezing.   Cardiovascular: Negative for chest pain and palpitations.  Musculoskeletal: Positive for arthralgias and back pain.    Patient Active Problem List   Diagnosis Date Noted  . Elevated glucose level 02/05/2018  . Gait instability 10/27/2017  . Environmental and seasonal allergies 02/14/2017  . Anxiety about health 12/10/2015  . Mixed hyperlipidemia 12/08/2015  . 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 Chronic pain 05/02/2015  . Osteoarthritis 06/16/2014  . Radiculopathy of lumbar region 06/16/2014  . 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 Chronic insomnia  06/12/2014  . Vitamin D deficiency 05/31/2014  . Neuralgia and neuritis 06/25/2013  . Arthritis, midfoot 06/18/2013    Prior to Admission medications   Medication Sig Start Date End Date Taking? Authorizing Provider  B Complex-Biotin-FA (B-COMPLEX PO) Take 1 tablet by mouth daily.    [provider]  cyclobenzaprine (FLEXERIL) 10 MG tablet Take 5-10 mg by mouth 3 (three) times daily as needed for muscle spasms.    [provider]  DULoxetine (CYMBALTA) 20 MG capsule Take 1 capsule (20 mg total) by mouth daily. 10/27/17   Plonk, Gwyndolyn Saxon, MD  estradiol (ESTRACE) 0.1 MG/GM vaginal cream Place 1 Applicatorful vaginally at bedtime.    [provider]  eszopiclone (LUNESTA) 1 MG TABS tablet Take 1 mg by mouth at bedtime as needed for sleep. Take immediately before bedtime    [provider]  HYDROcodone-acetaminophen (NORCO) 10-325 MG tablet Take 1 tablet by mouth 3 (three) times daily. 01/18/18   Sharlet Salina, MD  loratadine (CLARITIN) 10 MG tablet Take 10 mg by mouth daily as needed for allergies.    [provider]  Multiple Vitamin (ONE-A-DAY 55 PLUS PO) Take 1 tablet by mouth daily.    [provider]       [name], MD    Allergies  Allergen Reactions  . Neurontin [Gabapentin] Swelling  . Zolpidem Other (See Comments)    sleepwalking     Past Surgical History:  Procedure Laterality Date  . AXILLARY  SURGERY Right 2008  . CHOLECYSTECTOMY    . CYSTOCELE REPAIR N/A 02/08/2016   Procedure: ANTERIOR REPAIR (CYSTOCELE);  Surgeon: Boykin Nearing, MD;  Location: [facility] ORS;  Service: Gynecology;  Laterality: N/A;  . LAPAROSCOPIC GASTRIC SLEEVE RESECTION WITH HIATAL HERNIA REPAIR  2015  . TONSILLECTOMY    . TUBAL LIGATION    . VAGINAL HYSTERECTOMY N/A 02/08/2016   Procedure: HYSTERECTOMY VAGINAL WITH BSO;  Surgeon: Boykin Nearing, MD;  Location: [facility] ORS;  Service: Gynecology;  Laterality: N/A;  . VARICOSE VEIN SURGERY       Social History   Tobacco Use  . Smoking status: Former Research scientist (life sciences)  . Smokeless tobacco: Never Used  Substance Use Topics  . Alcohol use: Yes    Alcohol/week: 0.0 oz    Comment: wine occasionally  . Drug use: No     Medication list has been reviewed and updated.  PHQ 2/9 Scores 10/27/2017  PHQ - 2 Score 0    Physical Exam  Constitutional: She is oriented to person, place, and time. She appears well-developed. No distress.  HENT:  Head: Normocephalic and atraumatic.  Neck: Normal range of motion. Neck supple.  Cardiovascular: Normal rate, regular rhythm and normal heart sounds.  Pulmonary/Chest: Effort normal and breath sounds normal. No respiratory distress.  Musculoskeletal: Normal range of motion.  Middle finger left hand proximal phalanx with firm soft tissue swelling on the dorsum - 1 x 1 cm size, Full ROM of fingers, normal strength  Lymphadenopathy:    She has no cervical adenopathy.  Neurological: She is alert and oriented to person, place, and time.  Skin: Skin is warm and dry. No rash noted.  Psychiatric: She has a normal mood and affect. Her behavior is normal. Thought content normal.    BP 120/80   Pulse 88   Ht 5\' 2"  (1.575 m)   Wt 154 lb (69.9 kg)   BMI 28.17 kg/m   Assessment and Plan: 1. Finger swelling - Ambulatory referral to Orthopedic Surgery  2. Radiculopathy of lumbar region Followed by pain management  3. Elevated glucose level Pt encouraged to reduce intake of candy and other sweets  4. Colon cancer screening Consider cologuard - info sheet given Pt aware that positive cologuard will require a Dx colonoscopy  5. Breast cancer screening Schedule mammogram   No orders of the defined types were placed in this encounter.   Partially dictated using Editor, commissioning. Any errors are unintentional.  Halina Maidens, MD [facility]  02/05/2018

## 2018-02-05 NOTE — Patient Instructions (Signed)
Please consider a colonoscopy or Cologuard.

## 2018-02-09 DIAGNOSIS — M7989 Other specified soft tissue disorders: Secondary | ICD-10-CM | POA: Insufficient documentation

## 2018-02-13 DIAGNOSIS — R2230 Localized swelling, mass and lump, unspecified upper limb: Secondary | ICD-10-CM | POA: Insufficient documentation

## 2018-04-23 ENCOUNTER — Other Ambulatory Visit: Payer: Self-pay | Admitting: Physical Medicine and Rehabilitation

## 2018-04-23 DIAGNOSIS — M5442 Lumbago with sciatica, left side: Secondary | ICD-10-CM

## 2018-04-23 DIAGNOSIS — M5441 Lumbago with sciatica, right side: Secondary | ICD-10-CM

## 2018-05-08 ENCOUNTER — Ambulatory Visit
Admission: RE | Admit: 2018-05-08 | Discharge: 2018-05-08 | Disposition: A | Discharge status: Home / Self Care | Payer: Medicare Other | Source: Ambulatory Visit | Attending: Physical Medicine and Rehabilitation | Admitting: Physical Medicine and Rehabilitation

## 2018-05-23 ENCOUNTER — Ambulatory Visit
Admission: RE | Admit: 2018-05-23 | Discharge: 2018-05-23 | Disposition: A | Discharge status: Home / Self Care | Payer: Medicare Other | Source: Ambulatory Visit | Attending: Physical Medicine and Rehabilitation | Admitting: Physical Medicine and Rehabilitation

## 2018-05-23 ENCOUNTER — Encounter (INDEPENDENT_AMBULATORY_CARE_PROVIDER_SITE_OTHER): Payer: Self-pay

## 2018-05-23 DIAGNOSIS — M5127 Other intervertebral disc displacement, lumbosacral region: Secondary | ICD-10-CM | POA: Diagnosis not present

## 2018-05-23 DIAGNOSIS — M48061 Spinal stenosis, lumbar region without neurogenic claudication: Secondary | ICD-10-CM | POA: Diagnosis not present

## 2018-05-23 DIAGNOSIS — M5442 Lumbago with sciatica, left side: Secondary | ICD-10-CM | POA: Diagnosis present

## 2018-05-23 DIAGNOSIS — M5441 Lumbago with sciatica, right side: Secondary | ICD-10-CM | POA: Diagnosis present

## 2018-05-23 DIAGNOSIS — M5126 Other intervertebral disc displacement, lumbar region: Secondary | ICD-10-CM | POA: Diagnosis not present

## 2018-05-23 DIAGNOSIS — M4316 Spondylolisthesis, lumbar region: Secondary | ICD-10-CM | POA: Diagnosis not present

## 2018-05-23 DIAGNOSIS — M47816 Spondylosis without myelopathy or radiculopathy, lumbar region: Secondary | ICD-10-CM | POA: Insufficient documentation

## 2019-08-20 ENCOUNTER — Other Ambulatory Visit: Payer: Self-pay | Admitting: Internal Medicine

## 2019-08-20 ENCOUNTER — Ambulatory Visit
Admission: EM | Admit: 2019-08-20 | Discharge: 2019-08-20 | Disposition: A | Discharge status: Home / Self Care | Payer: Medicare Other | Attending: Internal Medicine | Admitting: Internal Medicine

## 2019-08-20 ENCOUNTER — Other Ambulatory Visit: Payer: Self-pay

## 2019-08-20 DIAGNOSIS — R9431 Abnormal electrocardiogram [ECG] [EKG]: Secondary | ICD-10-CM

## 2019-08-20 DIAGNOSIS — H6500 Acute serous otitis media, unspecified ear: Secondary | ICD-10-CM

## 2019-08-20 DIAGNOSIS — H9313 Tinnitus, bilateral: Secondary | ICD-10-CM

## 2019-08-20 DIAGNOSIS — R5383 Other fatigue: Secondary | ICD-10-CM | POA: Diagnosis not present

## 2019-08-20 DIAGNOSIS — I10 Essential (primary) hypertension: Secondary | ICD-10-CM | POA: Diagnosis present

## 2019-08-20 MED ORDER — AMLODIPINE BESYLATE 5 MG PO TABS: 5.0000 mg | ORAL_TABLET | Freq: Every day | ORAL | 0 refills | Status: DC

## 2019-08-20 NOTE — ED Provider Notes (Addendum)
[facility]    CSN: [id number] Arrival date & time: 08/20/19  0847      History   Chief Complaint Chief Complaint  Patient presents with  . Tinnitus    HPI Lori Trevino is a 68 y.o. female. who presents with worse ringing in her ears last night and could not sleep. Has been having ringing x 1 week. Admits of having elevation of BP in the past, but once she lost wt she got it controlled and knows she has gained wt and now her BP is back up. Has apt with new PCP Friday.     Past Medical History:  Diagnosis Date  . Radiculopathy     Patient Active Problem List   Diagnosis Date Noted  . Elevated glucose level 02/05/2018  . Gait instability 10/27/2017  . Environmental and seasonal allergies 02/14/2017  . Anxiety about health 12/10/2015  . Mixed hyperlipidemia 12/08/2015  . 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 Chronic pain 05/02/2015  . Osteoarthritis 06/16/2014  . Radiculopathy of lumbar region 06/16/2014  . 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 Chronic insomnia 06/12/2014  . Vitamin D deficiency 05/31/2014  . Neuralgia and neuritis 06/25/2013  . Arthritis, midfoot 06/18/2013    Past Surgical History:  Procedure Laterality Date  . AXILLARY SURGERY Right 2008  . CHOLECYSTECTOMY    . CYSTOCELE REPAIR N/A 02/08/2016   Procedure: ANTERIOR REPAIR (CYSTOCELE);  Surgeon: Boykin Nearing, MD;  Location: [facility] ORS;  Service: Gynecology;  Laterality: N/A;  . LAPAROSCOPIC GASTRIC SLEEVE RESECTION WITH HIATAL HERNIA REPAIR  2015  . TONSILLECTOMY    . TUBAL LIGATION    . VAGINAL HYSTERECTOMY N/A 02/08/2016   Procedure: HYSTERECTOMY VAGINAL WITH BSO;  Surgeon: Boykin Nearing, MD;  Location: [facility] ORS;  Service: Gynecology;  Laterality: N/A;  . VARICOSE VEIN SURGERY      OB History   No obstetric history on file.      Home Medications    Prior to Admission medications   Medication Sig Start Date End Date Taking? Authorizing  Provider  B Complex-Biotin-FA (B-COMPLEX PO) Take 1 tablet by mouth daily.    [provider]  cyclobenzaprine (FLEXERIL) 10 MG tablet Take 5-10 mg by mouth 3 (three) times daily as needed for muscle spasms.    [provider]  estradiol (ESTRACE) 0.1 MG/GM vaginal cream Place 1 Applicatorful vaginally at bedtime.    [provider]  eszopiclone (LUNESTA) 1 MG TABS tablet Take 1 mg by mouth at bedtime as needed for sleep. Take immediately before bedtime    [provider]  HYDROcodone-acetaminophen (NORCO) 10-325 MG tablet Take 1 tablet by mouth 3 (three) times daily. Dr Sharlet Salina 01/18/18   Sharlet Salina, MD  loratadine (CLARITIN) 10 MG tablet Take 10 mg by mouth daily as needed for allergies.    [provider]  Multiple Vitamin (ONE-A-DAY 55 PLUS PO) Take 1 tablet by mouth daily.    [provider]  TURMERIC PO Take 2 capsules by mouth daily.    [provider]    Family History Family History  Problem Relation Age of Onset  . Stroke Mother   . Cancer Father   . Breast cancer Neg Hx     Social History Social History   Tobacco Use  . Smoking status: Former Research scientist (life sciences)  . Smokeless tobacco: Never Used  Substance Use Topics  . Alcohol use: Yes    Alcohol/week: 0.0 standard drinks  Comment: wine occasionally  . Drug use: No     Allergies   Neurontin [gabapentin] and Zolpidem   Review of Systems Review of Systems  Constitutional: Positive for fatigue. Negative for appetite change, chills, diaphoresis and fever.  HENT: Positive for tinnitus. Negative for congestion, ear discharge, ear pain, postnasal drip, rhinorrhea, sneezing, sore throat and trouble swallowing.   Eyes: Negative for visual disturbance.  Respiratory: Negative for cough, chest tightness and shortness of breath.   Cardiovascular: Negative for chest pain, palpitations and leg swelling.  Gastrointestinal: Negative for abdominal pain and nausea.   Endocrine: Negative for polydipsia and polyphagia.  Genitourinary: Negative for difficulty urinating.  Musculoskeletal: Positive for arthralgias. Negative for gait problem and neck pain.       Has chronic pain, but has no pain today  Skin: Negative for rash.  Neurological: Negative for dizziness, tremors, syncope, facial asymmetry, speech difficulty, weakness, light-headedness, numbness and headaches.   Review of Systems  Constitutional: Negative for diaphoresis and unexpected weight change.  HENT: Negative for tinnitus.   Eyes: Negative for visual disturbance.  Respiratory: Negative for chest tightness and shortness of breath.   Cardiovascular: Negative for chest pain, palpitations and leg swelling.  Gastrointestinal: Negative for constipation, diarrhea and nausea.  Endocrine: Negative for polydipsia, polyphagia and polyuria.  Genitourinary: Negative for dysuria and frequency.  Skin: Negative for rash and wound.  Neurological: Negative for dizziness, speech difficulty, weakness, numbness and headaches.   Physical Exam Triage Vital Signs ED Triage Vitals  Enc Vitals Group     BP 08/20/19 0904 (!) 191/88     Pulse Rate 08/20/19 0904 96     Resp 08/20/19 0904 18     Temp 08/20/19 0904 98.3 F (36.8 C)     Temp Source 08/20/19 0904 Oral     SpO2 08/20/19 0904 100 %     Weight 08/20/19 0907 160 lb (72.6 kg)     Height --      Head Circumference --      Peak Flow --      Pain Score 08/20/19 0906 0     Pain Loc --      Pain Edu? --      Excl. in [place]? --    No data found.  Updated Vital Signs BP (!) 191/88 (BP Location: Right Arm)   Pulse 96   Temp 98.3 F (36.8 C) (Oral)   Resp 18   Wt 160 lb (72.6 kg)   SpO2 100%   BMI 29.26 kg/m   Visual Acuity Right Eye Distance:   Left Eye Distance:   Bilateral Distance:    Right Eye Near:   Left Eye Near:    Bilateral Near:     Physical Exam   Constitutional: She is oriented to person, place, and time. She appears  well-developed and well-nourished. No distress.  HENT:  Head: R TM is gray, dull and flat. Normocephalic and atraumatic.  Right Ear: Pearline Cables, slightly bulging, and dull. External ear normal.  Left Ear: External ear normal.  Nose: Nose normal.  Eyes: Conjunctivae are normal. Right eye exhibits no discharge. Left eye exhibits no discharge. No scleral icterus.  Neck: Neck supple. No thyromegaly present.  No carotid bruits bilaterally  Cardiovascular: Normal rate and regular rhythm.  No murmur heard. Pulmonary/Chest: Effort normal and breath sounds normal. No respiratory distress.  Musculoskeletal: Normal range of motion. She exhibits no edema.  Lymphadenopathy:    She has no cervical adenopathy.  Neurological: She is alert  and oriented to person, place, and time.  Skin: Skin is warm and dry. Capillary refill takes less than 2 seconds. No rash noted. She is not diaphoretic.  Psychiatric: She has a normal mood and affect. Her behavior is normal. Judgment and thought content normal.  Nursing note reviewed.   UC Treatments / Results  Labs (all labs ordered are listed, but only abnormal results are displayed) Labs Reviewed - No data to display  EKG Read by Dr Lacinda Axon. Shows Q wave to be more pronounced when compared to EKG from 01/25/2016, but no acute events. There is a change from prior EKG showing L atrial enlargement.   Radiology No results found.  Procedures Procedures (including critical care time)  Medications Ordered in UC Medications - No data to display  Initial Impression / Assessment and Plan / UC Course  I have reviewed the triage vital signs and the nursing notes. I reviewed the EKG findings and recommended her to [facility] with cardiology. She has one she saw in the past and will see if she can see him for FU. I started her on Flonase for [facility] and started her on Norvasc 5 mg qd. Needs to FU with PCP.  Pertinent test results that were available during my care of the patient were  reviewed by me and considered in my medical decision making (see chart for details).   Final Clinical Impressions(s) / UC Diagnoses   Final diagnoses:  None   Discharge Instructions   None    ED Prescriptions    None     PDMP not reviewed this encounter.   Shelby Mattocks, PA-C 08/20/19 [place], [place], PA-C 09/20/19 1338

## 2019-08-20 NOTE — ED Triage Notes (Addendum)
Pt. States she has had ringing in both of her ears for a week now. She states if fills like her ear wax is concrete, it keeps her up at night.

## 2019-08-20 NOTE — Discharge Instructions (Addendum)
Your EKG shows no acute events right now, but shows a change from 2017 where it looks like the L upper part of your heart is enlarged. I would recommends to see a cardiologist for an echo and stress test.   If you develop chest pain, SOB, nausea or sweating, please call the ambulance to take you to the emergency room   Try flonase nose spray 2 sprays each nostril for 7 days to get rid of fluid in your ear.   Follow up with your new family Dr on Friday as scheduled.

## 2019-08-23 ENCOUNTER — Other Ambulatory Visit: Payer: Self-pay

## 2019-08-23 ENCOUNTER — Encounter: Payer: Self-pay | Admitting: Internal Medicine

## 2019-08-23 ENCOUNTER — Ambulatory Visit (INDEPENDENT_AMBULATORY_CARE_PROVIDER_SITE_OTHER): Payer: Medicare Other | Admitting: Internal Medicine

## 2019-08-23 VITALS — BP 122/82 | HR 90 | Ht 62.0 in | Wt 160.0 lb

## 2019-08-23 DIAGNOSIS — F1120 Opioid dependence, uncomplicated: Secondary | ICD-10-CM | POA: Diagnosis not present

## 2019-08-23 DIAGNOSIS — I10 Essential (primary) hypertension: Secondary | ICD-10-CM | POA: Insufficient documentation

## 2019-08-23 DIAGNOSIS — F5104 Psychophysiologic insomnia: Secondary | ICD-10-CM | POA: Diagnosis not present

## 2019-08-23 DIAGNOSIS — H9313 Tinnitus, bilateral: Secondary | ICD-10-CM

## 2019-08-23 DIAGNOSIS — F32A Depression, unspecified: Secondary | ICD-10-CM

## 2019-08-23 DIAGNOSIS — F419 Anxiety disorder, unspecified: Secondary | ICD-10-CM | POA: Diagnosis not present

## 2019-08-23 DIAGNOSIS — F329 Major depressive disorder, single episode, unspecified: Secondary | ICD-10-CM

## 2019-08-23 MED ORDER — AMOXICILLIN-POT CLAVULANATE 875-125 MG PO TABS: 1.0000 | ORAL_TABLET | Freq: Two times a day (BID) | ORAL | 0 refills | Status: DC

## 2019-08-23 MED ORDER — HYDROXYZINE HCL 25 MG PO TABS: 25.0000 mg | ORAL_TABLET | Freq: Every day | ORAL | 0 refills | Status: DC

## 2019-08-23 NOTE — Progress Notes (Signed)
Date:  08/23/2019   Name:  Lori Trevino   DOB:  [birth year]   MRN:  [id number]   Chief Complaint: Tinnitus and Anxiety (13- GAD7, 20- PHQ9 )  Ear Fullness  There is pain in both ears. This is a new problem. The current episode started in the past 7 days. The problem has been unchanged. There has been no fever. The patient is experiencing no pain. Pertinent negatives include no coughing or headaches. Associated symptoms comments: Severe tinnitus. She has tried nothing for the symptoms.  Hypertension This is a new problem. The problem has been rapidly improving since onset. The problem is controlled. Pertinent negatives include no chest pain, headaches, palpitations or shortness of breath. Past treatments include calcium channel blockers (amlodipine started last visit). The current treatment provides significant improvement. There are no compliance problems (but pt is also taking Clonidine when she feels anxious and thinks that her BP is up).   Severe anxiety - she is very anxious, worrying about a stroke, unable to sleep due to tinnitus.  She is taking Cymbalta and thinks it causes diarrhea but does help her back pain.  She wonders if she should stop it or maybe increase the dose.  We discussed seeing Psych - her boyfriend and son see someone in [place] at [facility].  I recommend that she go there as soon as possible. Narcotic dependence - she has been on narcotics for chronic back pain for some time.  She decided 6 weeks ago to stop narcotics and go to the Methadone clinic. They are working to adjust her dose.  In the meantime, she continues to take narcotics about once a day - usually oxycontin.  Lab Results  Component Value Date   CREATININE 0.58 10/27/2017   BUN 19 10/27/2017   NA 137 10/27/2017   K 4.7 10/27/2017   CL 102 10/27/2017   CO2 28 10/27/2017   Lab Results  Component Value Date   CHOL 244 (H) 10/27/2017   HDL 116 10/27/2017   LDLCALC 116 (H) 10/27/2017   TRIG 59 10/27/2017    CHOLHDL 2.1 10/27/2017   Lab Results  Component Value Date   TSH 0.567 10/27/2017   No results found for: HGBA1C   Review of Systems  Constitutional: Negative for chills, fatigue, fever and unexpected weight change.  HENT: Positive for tinnitus.   Respiratory: Negative for cough, chest tightness, shortness of breath and wheezing.   Cardiovascular: Negative for chest pain, palpitations and leg swelling.  Neurological: Negative for dizziness, light-headedness and headaches.    Patient Active Problem List   Diagnosis Date Noted  . Elevated glucose level 02/05/2018  . Gait instability 10/27/2017  . Environmental and seasonal allergies 02/14/2017  . Anxiety about health 12/10/2015  . Mixed hyperlipidemia 12/08/2015  . 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 Chronic pain 05/02/2015  . Osteoarthritis 06/16/2014  . Radiculopathy of lumbar region 06/16/2014  . 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 Chronic insomnia 06/12/2014  . Vitamin D deficiency 05/31/2014  . Neuralgia and neuritis 06/25/2013  . Arthritis, midfoot 06/18/2013    Allergies  Allergen Reactions  . Neurontin [Gabapentin] Swelling  . Zolpidem Other (See Comments)    sleepwalking     Past Surgical History:  Procedure Laterality Date  . AXILLARY SURGERY Right 2008  . CHOLECYSTECTOMY    . CYSTOCELE REPAIR N/A 02/08/2016   Procedure: ANTERIOR REPAIR (CYSTOCELE);  Surgeon: Boykin Nearing, MD;  Location: [facility] ORS;  Service: Gynecology;  Laterality: N/A;  . LAPAROSCOPIC GASTRIC SLEEVE RESECTION WITH HIATAL HERNIA REPAIR  2015  . TONSILLECTOMY    . TUBAL LIGATION    . VAGINAL HYSTERECTOMY N/A 02/08/2016   Procedure: HYSTERECTOMY VAGINAL WITH BSO;  Surgeon: Boykin Nearing, MD;  Location: [facility] ORS;  Service: Gynecology;  Laterality: N/A;  . VARICOSE VEIN SURGERY      Social History   Tobacco Use  . Smoking status: Former Research scientist (life sciences)  . Smokeless tobacco: Never Used  Substance Use  Topics  . Alcohol use: Yes    Alcohol/week: 0.0 standard drinks    Comment: wine occasionally  . Drug use: No     Medication list has been reviewed and updated.  Current Meds  Medication Sig  . amLODipine (NORVASC) 5 MG tablet Take 1 tablet (5 mg total) by mouth daily. For high blood pressure  . B Complex-Biotin-FA (B-COMPLEX PO) Take 1 tablet by mouth daily.  . cholecalciferol (VITAMIN D3) 25 MCG (1000 UT) tablet Take 1,000 Units by mouth daily.  . cyclobenzaprine (FLEXERIL) 10 MG tablet Take 5-10 mg by mouth 3 (three) times daily as needed for muscle spasms.  . DULoxetine (CYMBALTA) 20 MG capsule Take 20 mg by mouth daily.  Marland Kitchen HYDROcodone-acetaminophen (NORCO) 10-325 MG tablet Take 1 tablet by mouth 3 (three) times daily. Dr Sharlet Salina Pt trying to wean off.  . loratadine (CLARITIN) 10 MG tablet Take 10 mg by mouth daily as needed for allergies.  . methadone (DOLOPHINE) 1 mg/ml oral solution Take by mouth. 60 MLs  . Multiple Vitamin (ONE-A-DAY 55 PLUS PO) Take 1 tablet by mouth daily.  Marland Kitchen zinc gluconate 50 MG tablet Take 50 mg by mouth daily.    PHQ 2/9 Scores 08/23/2019 10/27/2017  PHQ - 2 Score 6 0  PHQ- 9 Score 20 -    BP Readings from Last 3 Encounters:  08/23/19 122/82  08/20/19 (!) 191/88  02/05/18 120/80    Physical Exam Constitutional:      Appearance: She is not ill-appearing.     Comments: disheveled  HENT:     Right Ear: Ear canal normal.     Left Ear: Ear canal normal.     Nose: Nose normal.  Neck:     Musculoskeletal: Normal range of motion.  Cardiovascular:     Rate and Rhythm: Normal rate and regular rhythm.     Pulses: Normal pulses.     Heart sounds: No murmur.  Pulmonary:     Effort: Pulmonary effort is normal.     Breath sounds: Normal breath sounds. No wheezing.  Lymphadenopathy:     Cervical: No cervical adenopathy.  Skin:    General: Skin is warm and dry.  Neurological:     Mental Status: She is alert.  Psychiatric:        Attention and  Perception: She does not perceive auditory or visual hallucinations.        Mood and Affect: Mood is anxious. Affect is flat.        Speech: Speech is rapid and pressured.        Behavior: Behavior is agitated.        Thought Content: Thought content does not include suicidal ideation. Thought content does not include suicidal plan.     Wt Readings from Last 3 Encounters:  08/23/19 160 lb (72.6 kg)  08/20/19 160 lb (72.6 kg)  02/05/18 154 lb (69.9 kg)    BP 122/82   Pulse 90   Ht 5\' 2"  (1.575 m)  Wt 160 lb (72.6 kg)   SpO2 97%   BMI 29.26 kg/m   Assessment and Plan: 1. Tinnitus of both ears Minimal evidence of infection but given severity of tinnitus, will treat empirically Referral to ENT for Monday - amoxicillin-clavulanate (AUGMENTIN) 875-125 MG tablet; Take 1 tablet by mouth 2 (two) times daily for 10 days.  Dispense: 20 tablet; Refill: 0  2. Psychophysiological insomnia Begin Atarax at HS - this may help with ear sx - hydrOXYzine (ATARAX/VISTARIL) 25 MG tablet; Take 1 tablet (25 mg total) by mouth at bedtime.  Dispense: 30 tablet; Refill: 0  3. Anxiety and depression Severe anxiety demonstrated today She denies AH/VH and SI or intent Recommend care with [place]  4. Narcotic addiction ([place]) Currently on Methadone with dose being adjusted Pt does continue to take inappropriately obtained narcotic medications daily  5. Essential hypertension BP controlled; continue amlodipine 5 mg daily Pt instructed not to take any extra blood pressure medication - no clonidine and no additional amlodipine   Partially dictated using Editor, commissioning. Any errors are unintentional.  Halina Maidens, MD [facility]  08/23/2019

## 2019-09-09 ENCOUNTER — Other Ambulatory Visit: Payer: Self-pay | Admitting: Internal Medicine

## 2019-09-14 ENCOUNTER — Other Ambulatory Visit: Payer: Self-pay | Admitting: Internal Medicine

## 2019-09-14 DIAGNOSIS — F5104 Psychophysiologic insomnia: Secondary | ICD-10-CM

## 2019-09-17 ENCOUNTER — Encounter: Payer: Self-pay | Admitting: Internal Medicine

## 2019-09-17 ENCOUNTER — Other Ambulatory Visit: Payer: Self-pay

## 2019-09-17 ENCOUNTER — Ambulatory Visit (INDEPENDENT_AMBULATORY_CARE_PROVIDER_SITE_OTHER): Payer: Medicare Other | Admitting: Internal Medicine

## 2019-09-17 VITALS — BP 136/70 | HR 81 | Ht 62.0 in | Wt 149.0 lb

## 2019-09-17 DIAGNOSIS — H9313 Tinnitus, bilateral: Secondary | ICD-10-CM

## 2019-09-17 DIAGNOSIS — F418 Other specified anxiety disorders: Secondary | ICD-10-CM | POA: Diagnosis not present

## 2019-09-17 DIAGNOSIS — H903 Sensorineural hearing loss, bilateral: Secondary | ICD-10-CM | POA: Diagnosis not present

## 2019-09-17 DIAGNOSIS — I10 Essential (primary) hypertension: Secondary | ICD-10-CM | POA: Diagnosis not present

## 2019-09-17 DIAGNOSIS — M1811 Unilateral primary osteoarthritis of first carpometacarpal joint, right hand: Secondary | ICD-10-CM

## 2019-09-17 MED ORDER — AMOXICILLIN-POT CLAVULANATE 875-125 MG PO TABS: 1.0000 | ORAL_TABLET | Freq: Two times a day (BID) | ORAL | 0 refills | Status: AC

## 2019-09-17 NOTE — Progress Notes (Signed)
Date:  09/17/2019   Name:  Lori Trevino   DOB:  1951/01/30   MRN:  [id number]   Chief Complaint: Depression (Patient made appt with [facility]- Appt is scheduled for 12/21 over the phone.), Hypertension, and Tinnitus (Pt was given flonase and amoxicillin last visit. She said the abx helped and then the ringing in her ears came back after 2 days off the abx. )  Depression        This is a chronic problem.  The problem occurs daily.The problem is unchanged.  Associated symptoms include no fatigue and no headaches.     The symptoms are aggravated by social issues and family issues.  Treatments tried: she is scheduled to meet with [facility] next week for the first visit. Hypertension This is a chronic problem. The problem has been gradually improving since onset. The problem is controlled. Pertinent negatives include no chest pain, headaches, palpitations or shortness of breath. Past treatments include calcium channel blockers. The current treatment provides significant improvement. There are no compliance problems.   Tinnitus - felt to be due to hearing loss per ENT.  She had resolution of symptoms on Augmentin - they resumed when she stopped the medication.  She is frustrated that there is not an answer to her problem.  She wants to try the antibiotics one more time to see if the results were coincidental. Thumb pain - on right, certain movements are very painful and radiate up the arm.  No known injury.  She has recently started wearing a splint with benefit.  Lab Results  Component Value Date   CREATININE 0.58 10/27/2017   BUN 19 10/27/2017   NA 137 10/27/2017   K 4.7 10/27/2017   CL 102 10/27/2017   CO2 28 10/27/2017   Lab Results  Component Value Date   CHOL 244 (H) 10/27/2017   HDL 116 10/27/2017   LDLCALC 116 (H) 10/27/2017   TRIG 59 10/27/2017   CHOLHDL 2.1 10/27/2017   Lab Results  Component Value Date   TSH 0.567 10/27/2017   No results found for: HGBA1C   Review of Systems    Constitutional: Negative for chills and fatigue.  HENT: Positive for hearing loss and tinnitus. Negative for ear discharge and ear pain.   Respiratory: Negative for cough, chest tightness and shortness of breath.   Cardiovascular: Negative for chest pain, palpitations and leg swelling.  Gastrointestinal: Negative for constipation and diarrhea.  Musculoskeletal: Positive for arthralgias (right thumb and right knee) and gait problem.  Neurological: Negative for dizziness, light-headedness and headaches.  Hematological: Negative for adenopathy.  Psychiatric/Behavioral: Positive for depression, dysphoric mood and sleep disturbance.    Patient Active Problem List   Diagnosis Date Noted  . Sensorineural hearing loss (SNHL) of both ears 09/17/2019  . Essential hypertension 08/23/2019  . Narcotic addiction ([place]) 08/23/2019  . Psychophysiological insomnia 08/23/2019  . Tinnitus of both ears 08/23/2019  . Elevated glucose level 02/05/2018  . Gait instability 10/27/2017  . Environmental and seasonal allergies 02/14/2017  . Anxiety about health 12/10/2015  . Mixed hyperlipidemia 12/08/2015  . 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 Chronic pain 05/02/2015  . Osteoarthritis 06/16/2014  . Radiculopathy of lumbar region 06/16/2014  . 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 Chronic insomnia 06/12/2014  . Vitamin D deficiency 05/31/2014  . Neuralgia and neuritis 06/25/2013  . Arthritis, midfoot 06/18/2013    Allergies  Allergen Reactions  . Neurontin [Gabapentin] Swelling  . Zolpidem Other (See  Comments)    sleepwalking     Past Surgical History:  Procedure Laterality Date  . AXILLARY SURGERY Right 2008  . CHOLECYSTECTOMY    . CYSTOCELE REPAIR N/A 02/08/2016   Procedure: ANTERIOR REPAIR (CYSTOCELE);  Surgeon: Boykin Nearing, MD;  Location: [facility] ORS;  Service: Gynecology;  Laterality: N/A;  . LAPAROSCOPIC GASTRIC SLEEVE RESECTION WITH HIATAL HERNIA REPAIR   2015  . TONSILLECTOMY    . TUBAL LIGATION    . VAGINAL HYSTERECTOMY N/A 02/08/2016   Procedure: HYSTERECTOMY VAGINAL WITH BSO;  Surgeon: Boykin Nearing, MD;  Location: [facility] ORS;  Service: Gynecology;  Laterality: N/A;  . VARICOSE VEIN SURGERY      Social History   Tobacco Use  . Smoking status: Former Research scientist (life sciences)  . Smokeless tobacco: Never Used  Substance Use Topics  . Alcohol use: Yes    Alcohol/week: 0.0 standard drinks    Comment: wine occasionally  . Drug use: No     Medication list has been reviewed and updated.  Current Meds  Medication Sig  . amLODipine (NORVASC) 5 MG tablet TAKE 1 TABLET (5 MG TOTAL) BY MOUTH DAILY. FOR HIGH BLOOD PRESSURE  . B Complex-Biotin-FA (B-COMPLEX PO) Take 1 tablet by mouth daily.  . cholecalciferol (VITAMIN D3) 25 MCG (1000 UT) tablet Take 1,000 Units by mouth daily.  . cyclobenzaprine (FLEXERIL) 10 MG tablet Take 5-10 mg by mouth 3 (three) times daily as needed for muscle spasms.  . DULoxetine (CYMBALTA) 20 MG capsule Take 20 mg by mouth daily.  . hydrOXYzine (ATARAX/VISTARIL) 25 MG tablet TAKE 1 TABLET BY MOUTH EVERYDAY AT BEDTIME  . loratadine (CLARITIN) 10 MG tablet Take 10 mg by mouth daily as needed for allergies.  . methadone (DOLOPHINE) 1 mg/ml oral solution Take by mouth. 70 MLs  . Multiple Vitamin (ONE-A-DAY 55 PLUS PO) Take 1 tablet by mouth daily.  Marland Kitchen zinc gluconate 50 MG tablet Take 50 mg by mouth daily.    PHQ 2/9 Scores 09/17/2019 08/23/2019 10/27/2017  PHQ - 2 Score 6 6 0  PHQ- 9 Score 16 20 -    BP Readings from Last 3 Encounters:  09/17/19 136/70  08/23/19 122/82  08/20/19 (!) 191/88    Physical Exam Vitals and nursing note reviewed.  Constitutional:      General: She is not in acute distress.    Appearance: Normal appearance. She is well-developed.  HENT:     Head: Normocephalic and atraumatic.     Right Ear: Tympanic membrane, ear canal and external ear normal. Decreased hearing noted.     Left Ear: Tympanic  membrane, ear canal and external ear normal. Decreased hearing noted.  Cardiovascular:     Rate and Rhythm: Normal rate and regular rhythm.     Pulses: Normal pulses.     Heart sounds: No murmur.  Pulmonary:     Effort: Pulmonary effort is normal. No respiratory distress.  Musculoskeletal:     Right hand: Tenderness (at first MCP joint with thenar wasting) present.     Cervical back: Normal range of motion.     Right lower leg: No edema.     Left lower leg: No edema.  Lymphadenopathy:     Cervical: No cervical adenopathy.  Skin:    General: Skin is warm and dry.     Findings: No rash.  Neurological:     Mental Status: She is alert and oriented to person, place, and time.  Psychiatric:        Behavior: Behavior  normal.        Thought Content: Thought content normal.     Wt Readings from Last 3 Encounters:  09/17/19 149 lb (67.6 kg)  08/23/19 160 lb (72.6 kg)  08/20/19 160 lb (72.6 kg)    BP 136/70   Pulse 81   Ht 5\' 2"  (1.575 m)   Wt 149 lb (67.6 kg)   SpO2 98%   BMI 27.25 kg/m   Assessment and Plan: 1. Tinnitus of both ears Pt had resolution of symptoms during the course of therapy - went stopped sx resumed/retried the rest of the course and sx stopped.  Now back again and wants to try one more course of Augmentin. - amoxicillin-clavulanate (AUGMENTIN) 875-125 MG tablet; Take 1 tablet by mouth 2 (two) times daily for 10 days.  Dispense: 20 tablet; Refill: 0  2. Essential hypertension Clinically stable exam with well controlled BP.   Tolerating medications, amlodipine 5 mg, without side effects at this time. Pt to continue current regimen and low sodium diet; benefits of regular exercise as able discussed.  3. Sensorineural hearing loss (SNHL) of both ears Seen by ENT - hearing aids recommended but pt can not afford them This also likely the cause of her tinnitus  4. Mixed anxiety depressive disorder First appointment with [facility] is next week  5. Primary  osteoarthritis of first carpometacarpal joint of right hand May continue splint as needed   Partially dictated using Editor, commissioning. Any errors are unintentional.  Halina Maidens, MD [facility]  09/17/2019

## 2019-09-23 ENCOUNTER — Telehealth: Payer: Self-pay | Admitting: Internal Medicine

## 2019-09-23 NOTE — Telephone Encounter (Signed)
She can finish what she has.  If her symptoms continue then there is no benefit of more antibiotics.

## 2019-09-23 NOTE — Telephone Encounter (Signed)
May need another dose of her antibiotic (amoxicillian-clavulante)  due to tinnitus, she has two days left she also said it is working. She also said that she will not fill the medication if she does not need it.

## 2019-09-30 ENCOUNTER — Ambulatory Visit: Payer: Medicare Other | Admitting: Internal Medicine

## 2019-10-07 ENCOUNTER — Ambulatory Visit (INDEPENDENT_AMBULATORY_CARE_PROVIDER_SITE_OTHER): Payer: Medicare Other | Admitting: Internal Medicine

## 2019-10-07 ENCOUNTER — Other Ambulatory Visit: Payer: Self-pay

## 2019-10-07 ENCOUNTER — Encounter: Payer: Self-pay | Admitting: Internal Medicine

## 2019-10-07 VITALS — BP 126/76 | HR 103 | Ht 62.0 in | Wt 146.0 lb

## 2019-10-07 DIAGNOSIS — I10 Essential (primary) hypertension: Secondary | ICD-10-CM

## 2019-10-07 DIAGNOSIS — H903 Sensorineural hearing loss, bilateral: Secondary | ICD-10-CM | POA: Diagnosis not present

## 2019-10-07 DIAGNOSIS — F5104 Psychophysiologic insomnia: Secondary | ICD-10-CM | POA: Diagnosis not present

## 2019-10-07 DIAGNOSIS — H65195 Other acute nonsuppurative otitis media, recurrent, left ear: Secondary | ICD-10-CM

## 2019-10-07 MED ORDER — AMOXICILLIN-POT CLAVULANATE 500-125 MG PO TABS: 1.0000 | ORAL_TABLET | Freq: Three times a day (TID) | ORAL | 0 refills | Status: AC

## 2019-10-07 MED ORDER — HYDROXYZINE HCL 25 MG PO TABS: 75.0000 mg | ORAL_TABLET | Freq: Three times a day (TID) | ORAL | 0 refills | Status: DC | PRN

## 2019-10-07 NOTE — Patient Instructions (Addendum)
Take Cymbalta - 1/2 capsule daily for one week then stop.  The follow up with psychiatrist  Take Hydroxyzine - 2-3 capsules three times a day as needed (50-75 mg)  Take augmentin twice a day for 10 days

## 2019-10-07 NOTE — Progress Notes (Signed)
Date:  10/07/2019   Name:  Lori Trevino   DOB:  1951/05/29   MRN:  [id number]   Chief Complaint: Insomnia (Taking hydroxyzine for this and she said its moderately helping but needs higher dose. She said she is not sleeping at all because the tinnitus in her ears. ) and Depression (Cymbalta is making noise in her head worse. Trying to wean off of the this medication. )  Insomnia Primary symptoms: sleep disturbance, difficulty falling asleep, premature morning awakening.  The current episode started more than one month. PMH includes: depression.  Depression        This is a chronic (Pt has has one telephone visit with Psych and plans to see the MD in person in the near future) problem.  Associated symptoms include insomnia.  Associated symptoms include no fatigue and no headaches.  Treatments tried: currently weaning off of cymbalta. Hypertension This is a chronic problem. The problem is controlled. Pertinent negatives include no chest pain, headaches, palpitations or shortness of breath. Past treatments include calcium channel blockers.  Tinnitus - she thinks that the cymbalta is the cause of her ears ringing.  She has had 2 courses of augmentin for possible inner ear infection.  She wants one more course to get her through until she can stop the Cymbalta.  Lab Results  Component Value Date   CREATININE 0.58 10/27/2017   BUN 19 10/27/2017   NA 137 10/27/2017   K 4.7 10/27/2017   CL 102 10/27/2017   CO2 28 10/27/2017   Lab Results  Component Value Date   CHOL 244 (H) 10/27/2017   HDL 116 10/27/2017   LDLCALC 116 (H) 10/27/2017   TRIG 59 10/27/2017   CHOLHDL 2.1 10/27/2017   Lab Results  Component Value Date   TSH 0.567 10/27/2017   No results found for: HGBA1C   Review of Systems  Constitutional: Negative for chills, fatigue and fever.  HENT: Positive for ear pain (left ear fullness) and tinnitus.   Respiratory: Negative for cough, chest tightness and shortness of breath.     Cardiovascular: Negative for chest pain, palpitations and leg swelling.  Gastrointestinal: Negative for abdominal pain, constipation, diarrhea, nausea and vomiting.  Musculoskeletal: Positive for back pain.  Neurological: Negative for dizziness, light-headedness and headaches.  Psychiatric/Behavioral: Positive for depression, dysphoric mood and sleep disturbance. The patient is nervous/anxious and has insomnia.     Patient Active Problem List   Diagnosis Date Noted  . Sensorineural hearing loss (SNHL) of both ears 09/17/2019  . Essential hypertension 08/23/2019  . Narcotic addiction ([place]) 08/23/2019  . Psychophysiological insomnia 08/23/2019  . Tinnitus of both ears 08/23/2019  . Elevated glucose level 02/05/2018  . Gait instability 10/27/2017  . Environmental and seasonal allergies 02/14/2017  . Anxiety about health 12/10/2015  . Mixed hyperlipidemia 12/08/2015  . 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 Chronic pain 05/02/2015  . Osteoarthritis 06/16/2014  . Radiculopathy of lumbar region 06/16/2014  . 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 Chronic insomnia 06/12/2014  . Vitamin D deficiency 05/31/2014  . Neuralgia and neuritis 06/25/2013  . Arthritis, midfoot 06/18/2013    Allergies  Allergen Reactions  . Neurontin [Gabapentin] Swelling  . Zolpidem Other (See Comments)    sleepwalking     Past Surgical History:  Procedure Laterality Date  . AXILLARY SURGERY Right 2008  . CHOLECYSTECTOMY    . CYSTOCELE REPAIR N/A 02/08/2016   Procedure: ANTERIOR REPAIR (CYSTOCELE);  Surgeon: Gwen Her Schermerhorn,  MD;  Location: [facility] ORS;  Service: Gynecology;  Laterality: N/A;  . LAPAROSCOPIC GASTRIC SLEEVE RESECTION WITH HIATAL HERNIA REPAIR  2015  . TONSILLECTOMY    . TUBAL LIGATION    . VAGINAL HYSTERECTOMY N/A 02/08/2016   Procedure: HYSTERECTOMY VAGINAL WITH BSO;  Surgeon: Boykin Nearing, MD;  Location: [facility] ORS;  Service: Gynecology;  Laterality: N/A;   . VARICOSE VEIN SURGERY      Social History   Tobacco Use  . Smoking status: Former Research scientist (life sciences)  . Smokeless tobacco: Never Used  Substance Use Topics  . Alcohol use: Yes    Alcohol/week: 0.0 standard drinks    Comment: wine occasionally  . Drug use: No     Medication list has been reviewed and updated.  Current Meds  Medication Sig  . amLODipine (NORVASC) 5 MG tablet TAKE 1 TABLET (5 MG TOTAL) BY MOUTH DAILY. FOR HIGH BLOOD PRESSURE  . B Complex-Biotin-FA (B-COMPLEX PO) Take 1 tablet by mouth daily.  . cholecalciferol (VITAMIN D3) 25 MCG (1000 UT) tablet Take 1,000 Units by mouth daily.  . cyclobenzaprine (FLEXERIL) 10 MG tablet Take 5-10 mg by mouth 3 (three) times daily as needed for muscle spasms.  . DULoxetine (CYMBALTA) 20 MG capsule Take 15 mg by mouth daily.   . hydrOXYzine (ATARAX/VISTARIL) 25 MG tablet TAKE 1 TABLET BY MOUTH EVERYDAY AT BEDTIME  . loratadine (CLARITIN) 10 MG tablet Take 10 mg by mouth daily as needed for allergies.  . methadone (DOLOPHINE) 1 mg/ml oral solution Take by mouth. 70 MLs  . Multiple Vitamin (ONE-A-DAY 55 PLUS PO) Take 1 tablet by mouth daily.  Marland Kitchen zinc gluconate 50 MG tablet Take 50 mg by mouth daily.    PHQ 2/9 Scores 10/07/2019 09/17/2019 08/23/2019 10/27/2017  PHQ - 2 Score 4 6 6  0  PHQ- 9 Score 17 16 20  -    BP Readings from Last 3 Encounters:  10/07/19 126/76  09/17/19 136/70  08/23/19 122/82    Physical Exam Vitals and nursing note reviewed.  Constitutional:      General: She is not in acute distress.    Appearance: She is well-developed.  HENT:     Head: Normocephalic and atraumatic.     Right Ear: Tympanic membrane, ear canal and external ear normal.     Left Ear: Ear canal and external ear normal. Tympanic membrane is retracted.  Cardiovascular:     Rate and Rhythm: Normal rate and regular rhythm.     Pulses: Normal pulses.     Heart sounds: No murmur.  Pulmonary:     Effort: Pulmonary effort is normal. No respiratory  distress.  Musculoskeletal:        General: Normal range of motion.     Cervical back: Normal range of motion.     Right lower leg: No edema.     Left lower leg: No edema.  Lymphadenopathy:     Cervical: No cervical adenopathy.  Skin:    General: Skin is warm and dry.     Capillary Refill: Capillary refill takes less than 2 seconds.     Findings: No rash.  Neurological:     Mental Status: She is alert and oriented to person, place, and time.  Psychiatric:        Attention and Perception: Attention normal.        Mood and Affect: Mood is anxious. Affect is labile.        Speech: Speech is rapid and pressured.  Behavior: Behavior normal.        Thought Content: Thought content normal.     Wt Readings from Last 3 Encounters:  10/07/19 146 lb (66.2 kg)  09/17/19 149 lb (67.6 kg)  08/23/19 160 lb (72.6 kg)    BP 126/76   Pulse (!) 103   Ht 5\' 2"  (1.575 m)   Wt 146 lb (66.2 kg)   SpO2 99%   BMI 26.70 kg/m   Assessment and Plan: 1. Psychophysiological insomnia Can increase the atarax to 50-75 mg three times a day and HS as needed - hydrOXYzine (ATARAX/VISTARIL) 25 MG tablet; Take 3 tablets (75 mg total) by mouth every 8 (eight) hours as needed.  Dispense: 270 tablet; Refill: 0  2. Sensorineural hearing loss (SNHL) of both ears Contributing to tinnitus and irritability Taper Cymbalta to 10 mg daily for one week then STOP  3. Other recurrent acute nonsuppurative otitis media of left ear Will give one more course of augmentin - amoxicillin-clavulanate (AUGMENTIN) 500-125 MG tablet; Take 1 tablet (500 mg total) by mouth 3 (three) times daily for 7 days.  Dispense: 21 tablet; Refill: 0  4. Essential hypertension Clinically stable exam with well controlled BP.   Tolerating medications, amlodipine 5 mg, without side effects at this time. Pt to continue current regimen and low sodium diet; benefits of regular exercise as able discussed.   Partially dictated using Radio producer. Any errors are unintentional.  Halina Maidens, MD [facility]  10/07/2019

## 2019-10-17 ENCOUNTER — Telehealth: Payer: Self-pay

## 2019-10-17 NOTE — Telephone Encounter (Signed)
Patient called requesting a letter for her to be dismissed from [employer]. She said she is unable to sit for Jury Duty due to her sciatica and back issues.  Please advise.

## 2019-10-18 ENCOUNTER — Encounter: Payer: Self-pay | Admitting: Internal Medicine

## 2019-10-18 NOTE — Telephone Encounter (Signed)
Left VM for patient to pick up her letter for [employer].

## 2019-10-18 NOTE — Telephone Encounter (Signed)
I wrote a letter and put it in your box.

## 2019-10-18 NOTE — Telephone Encounter (Signed)
Left letter with front desk.

## 2019-10-23 ENCOUNTER — Ambulatory Visit: Payer: Medicare Other

## 2019-10-24 ENCOUNTER — Other Ambulatory Visit: Payer: Self-pay | Admitting: Internal Medicine

## 2019-10-24 DIAGNOSIS — F5104 Psychophysiologic insomnia: Secondary | ICD-10-CM

## 2019-10-25 ENCOUNTER — Other Ambulatory Visit: Payer: Self-pay | Admitting: Internal Medicine

## 2019-10-25 DIAGNOSIS — F5104 Psychophysiologic insomnia: Secondary | ICD-10-CM

## 2019-10-25 MED ORDER — HYDROXYZINE HCL 25 MG PO TABS: 75.0000 mg | ORAL_TABLET | Freq: Every evening | ORAL | 1 refills | Status: DC | PRN

## 2019-11-04 DIAGNOSIS — M779 Enthesopathy, unspecified: Secondary | ICD-10-CM

## 2019-11-04 HISTORY — DX: Enthesopathy, unspecified: M77.9

## 2019-11-18 ENCOUNTER — Ambulatory Visit (INDEPENDENT_AMBULATORY_CARE_PROVIDER_SITE_OTHER): Payer: Medicare Other

## 2019-11-18 VITALS — Ht 62.0 in | Wt 143.0 lb

## 2019-11-18 DIAGNOSIS — Z1211 Encounter for screening for malignant neoplasm of colon: Secondary | ICD-10-CM

## 2019-11-18 DIAGNOSIS — Z1231 Encounter for screening mammogram for malignant neoplasm of breast: Secondary | ICD-10-CM

## 2019-11-18 DIAGNOSIS — Z0000 Encounter for general adult medical examination without abnormal findings: Secondary | ICD-10-CM

## 2019-11-18 DIAGNOSIS — Z780 Asymptomatic menopausal state: Secondary | ICD-10-CM | POA: Diagnosis not present

## 2019-11-18 NOTE — Progress Notes (Signed)
Subjective:   Lori Trevino is a 69 y.o. female who presents for an Initial Medicare Annual Wellness Visit.  Virtual Visit via Telephone Note  I connected with Kiswana Postiglione on 11/18/19 at  1:20 PM EST by telephone and verified that I am speaking with the correct person using two identifiers.  Medicare Annual Wellness visit completed telephonically due to Covid-19 pandemic.   Location: Patient: home Provider: office   I discussed the limitations, risks, security and privacy concerns of performing an evaluation and management service by telephone and the availability of in person appointments. The patient expressed understanding and agreed to proceed.  Some vital signs may be absent or patient reported.   Clemetine Marker, LPN    Review of Systems      Cardiac Risk Factors include: advanced age (>1men, >16 women);hypertension     Objective:    Today's Vitals   11/18/19 1327  Weight: 143 lb (64.9 kg)  Height: 5\' 2"  (1.575 m)  PainSc: 5    Body mass index is 26.16 kg/m.  Advanced Directives 11/18/2019 10/07/2017 10/02/2017 09/15/2017 06/30/2017 02/08/2016 01/21/2016  Does Patient Have a Medical Advance Directive? No No No No No No No  Would patient like information on creating a medical advance directive? Yes (MAU/Ambulatory/Procedural Areas - Information given) - - - - No - patient declined information No - patient declined information    Current Medications (verified) Outpatient Encounter Medications as of 11/18/2019  Medication Sig  . amLODipine (NORVASC) 5 MG tablet TAKE 1 TABLET (5 MG TOTAL) BY MOUTH DAILY. FOR HIGH BLOOD PRESSURE  . B Complex-Biotin-FA (B-COMPLEX PO) Take 1 tablet by mouth daily.  . busPIRone (BUSPAR) 5 MG tablet Take 10 mg by mouth 3 (three) times daily.  . cyclobenzaprine (FLEXERIL) 10 MG tablet Take 5-10 mg by mouth 3 (three) times daily as needed for muscle spasms.  . hydrOXYzine (ATARAX/VISTARIL) 25 MG tablet Take 3 tablets (75 mg total) by mouth at  bedtime as needed.  . loratadine (CLARITIN) 10 MG tablet Take 10 mg by mouth daily as needed for allergies.  . methadone (DOLOPHINE) 1 mg/ml oral solution Take by mouth. 70 MLs  . Multiple Vitamin (ONE-A-DAY 55 PLUS PO) Take 1 tablet by mouth daily.  Marland Kitchen zinc gluconate 50 MG tablet Take 50 mg by mouth daily.  . [DISCONTINUED] cholecalciferol (VITAMIN D3) 25 MCG (1000 UT) tablet Take 1,000 Units by mouth daily.  . [DISCONTINUED] DULoxetine (CYMBALTA) 20 MG capsule Take 15 mg by mouth daily.    No facility-administered encounter medications on file as of 11/18/2019.    Allergies (verified) Neurontin [gabapentin] and Zolpidem   History: Past Medical History:  Diagnosis Date  . Allergy   . Hypertension   . Radiculopathy   . Tendonitis 11/2019   right wrist and right arm   Past Surgical History:  Procedure Laterality Date  . AXILLARY SURGERY Right 2008  . CHOLECYSTECTOMY    . CYSTOCELE REPAIR N/A 02/08/2016   Procedure: ANTERIOR REPAIR (CYSTOCELE);  Surgeon: Boykin Nearing, MD;  Location: [facility] ORS;  Service: Gynecology;  Laterality: N/A;  . LAPAROSCOPIC GASTRIC SLEEVE RESECTION WITH HIATAL HERNIA REPAIR  2015  . TONSILLECTOMY    . TUBAL LIGATION    . VAGINAL HYSTERECTOMY N/A 02/08/2016   Procedure: HYSTERECTOMY VAGINAL WITH BSO;  Surgeon: Boykin Nearing, MD;  Location: [facility] OR[facility];  Service: Gynecology;  Laterality: N/A;  . VARICOSE VEIN SURGERY     Family History  Problem Relation Age of Onset  .  Stroke Mother   . Cancer Father   . Breast cancer Neg Hx    Social History   Socioeconomic History  . Marital status: Single    Spouse name: Not on file  . Number of children: Not on file  . Years of education: Not on file  . Highest education level: Not on file  Occupational History  . Not on file  Tobacco Use  . Smoking status: Former Research scientist (life sciences)  . Smokeless tobacco: Never Used  Substance and Sexual Activity  . Alcohol use: Not Currently    Alcohol/week: 0.0 standard  drinks  . Drug use: No  . Sexual activity: Yes  Other Topics Concern  . Not on file  Social History Narrative  . Not on file   Social Determinants of Health   Financial Resource Strain: High Risk  . Difficulty of Paying Living Expenses: Very hard  Food Insecurity: Food Insecurity Present  . Worried About Charity fundraiser in the Last Year: Sometimes true  . Ran Out of Food in the Last Year: Never true  Transportation Needs: No Transportation Needs  . Lack of Transportation (Medical): No  . Lack of Transportation (Non-Medical): No  Physical Activity: Sufficiently Active  . Days of Exercise per Week: 7 days  . Minutes of Exercise per Session: 30 min  Stress: Stress Concern Present  . Feeling of Stress : Rather much  Social Connections: Unknown  . Frequency of Communication with Friends and Family: Patient refused  . Frequency of Social Gatherings with Friends and Family: Patient refused  . Attends Religious Services: Patient refused  . Active Member of Clubs or Organizations: Patient refused  . Attends Archivist Meetings: Patient refused  . Marital Status: Living with partner    Tobacco Counseling Counseling given: Not Answered   Clinical Intake:  Pre-visit preparation completed: Yes  Pain : 0-10 Pain Score: 5  Pain Type: Chronic pain Pain Location: Wrist(arm) Pain Orientation: Right Pain Descriptors / Indicators: Aching, Sharp Pain Onset: More than a month ago Pain Frequency: Constant     BMI - recorded: 26.16 Nutritional Status: BMI 25 -29 Overweight Nutritional Risks: Nausea/ vomitting/ diarrhea(related to medication) Diabetes: No  How often do you need to have someone help you when you read instructions, pamphlets, or other written materials from your doctor or pharmacy?: 1 - Never  Interpreter Needed?: No  Information entered by :: Clemetine Marker LPN   Activities of Daily Living In your present state of health, do you have any difficulty  performing the following activities: 11/18/2019 08/23/2019  Hearing? N N  Comment declines hearing aids -  Vision? N N  Difficulty concentrating or making decisions? N N  Walking or climbing stairs? N N  Dressing or bathing? N N  Doing errands, shopping? N N  Preparing Food and eating ? N -  Using the Toilet? N -  In the past six months, have you accidently leaked urine? N -  Do you have problems with loss of bowel control? N -  Managing your Medications? N -  Managing your Finances? N -  Housekeeping or managing your Housekeeping? N -  Some recent data might be hidden     Immunizations and Health Maintenance Immunization History  Administered Date(s) Administered  . Influenza, High Dose Seasonal PF 10/27/2017  . Influenza-Unspecified 09/08/2015, 10/22/2016  . Pneumococcal Conjugate-13 10/27/2017  . Tdap 07/16/2014   Health Maintenance Due  Topic Date Due  . Hepatitis C Screening  [birth year]  .  COLONOSCOPY  05/08/1999  . DEXA SCAN  05/07/2014  . MAMMOGRAM  06/16/2017  . PNA vac Low Risk Adult (2 of 2 - PPSV23) 10/27/2018  . INFLUENZA VACCINE  05/04/2019    Patient Care Team: Glean Hess, MD as PCP - General (Internal Medicine) Sharlet Salina, MD as Referring Physician (Physical Medicine and Rehabilitation) Beverly Gust, MD (Otolaryngology)  Indicate any recent Medical Services you may have received from other than [facility] providers in the past year (date may be approximate).     Assessment:   This is a routine wellness examination for [place].  Hearing/Vision screen  Hearing Screening   125Hz  250Hz  500Hz  1000Hz  2000Hz  3000Hz  4000Hz  6000Hz  8000Hz   Right ear:           Left ear:           Comments: Pt denies hearing difficulty; reports improvement since tinnitus resolved.   Vision Screening Comments: Past due for eye exam, not established with eye provider and declines referral  Dietary issues and exercise activities discussed: Current Exercise Habits:  Home exercise routine, Type of exercise: walking, Time (Minutes): 30, Frequency (Times/Week): 7, Weekly Exercise (Minutes/Week): 210, Intensity: Moderate, Exercise limited by: None identified  Goals    . DIET - INCREASE WATER INTAKE     Recommend drinking 6-8 glasses of water per day      Depression Screen PHQ 2/9 Scores 11/18/2019 10/07/2019 09/17/2019 08/23/2019 10/27/2017  PHQ - 2 Score 0 4 6 6  0  PHQ- 9 Score 7 17 16 20  -    Fall Risk Fall Risk  11/18/2019 10/07/2019 10/27/2017  Falls in the past year? 0 0 No  Number falls in past yr: 0 0 -  Injury with Fall? 0 0 -  Risk for fall due to : No Fall Risks - -  Follow up Falls prevention discussed Falls evaluation completed -    FALL RISK PREVENTION PERTAINING TO THE HOME:  Any stairs in or around the home? Yes  If so, do they handrails? Yes   Home free of loose throw rugs in walkways, pet beds, electrical cords, etc? Yes  Adequate lighting in your home to reduce risk of falls? Yes   ASSISTIVE DEVICES UTILIZED TO PREVENT FALLS:  Life alert? No  Use of a cane, walker or w/c? No  Grab bars in the bathroom? No  Shower chair or bench in shower? No  Elevated toilet seat or a handicapped toilet? No   DME ORDERS:  DME order needed?  No   TIMED UP AND GO:  Was the test performed? No . Telephonic visit.   Education: Fall risk prevention has been discussed.  Intervention(s) required? No    Cognitive Function: 6CIT deferred for 2021 AWV        Screening Tests Health Maintenance  Topic Date Due  . Hepatitis C Screening  [birth year]  . COLONOSCOPY  05/08/1999  . DEXA SCAN  05/07/2014  . MAMMOGRAM  06/16/2017  . PNA vac Low Risk Adult (2 of 2 - PPSV23) 10/27/2018  . INFLUENZA VACCINE  05/04/2019  . TETANUS/TDAP  10/27/2025    Qualifies for Shingles Vaccine? Yes . Due for Shingrix. Education has been provided regarding the importance of this vaccine. Pt has been advised to call insurance company to determine out of pocket  expense. Advised may also receive vaccine at local pharmacy or Health Dept. Verbalized acceptance and understanding.  Tdap: Up to date  Flu Vaccine: Due for Flu vaccine. Does the patient want to receive  this vaccine today?  No . Education has been provided regarding the importance of this vaccine but still declined. Advised may receive this vaccine at local pharmacy or Health Dept. Aware to provide a copy of the vaccination record if obtained from local pharmacy or Health Dept. Verbalized acceptance and understanding.  Pneumococcal Vaccine: Due for Pneumococcal vaccine. Does the patient want to receive this vaccine today?  No . Education has been provided regarding the importance of this vaccine but still declined. Advised may receive this vaccine at local pharmacy or Health Dept. Aware to provide a copy of the vaccination record if obtained from local pharmacy or Health Dept. Verbalized acceptance and understanding.   Cancer Screenings:  Colorectal Screening: Due - Cologuard ordered today.   Mammogram: Completed 06/17/15. Repeat every year. Ordered today. Pt provided with contact information and advised to call to schedule appt.   Bone Density: DUE - Ordered today. Pt provided with contact information and advised to call to schedule appt.   Lung Cancer Screening: (Low Dose CT Chest recommended if Age 56-80 years, 30 pack-year currently smoking OR have quit w/in 15years.) does not qualify.   Additional Screening:  Hepatitis C Screening: does qualify; postponed  Vision Screening: Recommended annual ophthalmology exams for early detection of glaucoma and other disorders of the eye. Is the patient up to date with their annual eye exam?  No  Who is the provider or what is the name of the office in which the pt attends annual eye exams? Not established If pt is not established with a provider, would they like to be referred to a provider to establish care? No .   Dental Screening: Recommended  annual dental exams for proper oral hygiene  Community Resource Referral:  CRR required this visit?  No  - pt states financial difficulty but declined C3 referral for community resources at this time.      Plan:    I have personally reviewed and addressed the Medicare Annual Wellness questionnaire and have noted the following in the patient's chart:  A. Medical and social history B. Use of alcohol, tobacco or illicit drugs  C. Current medications and supplements D. Functional ability and status E.  Nutritional status F.  Physical activity G. Advance directives H. List of other physicians I.  Hospitalizations, surgeries, and ER visits in previous 12 months J.  [place] such as hearing and vision if needed, cognitive and depression L. Referrals and appointments   In addition, I have reviewed and discussed with patient certain preventive protocols, quality metrics, and best practice recommendations. A written personalized care plan for preventive services as well as general preventive health recommendations were provided to patient.   Signed,  Clemetine Marker, LPN Nurse Health Advisor   Nurse Notes: pt due for labs, encouraged preventative screenings and provided information to schedule covid vaccine.

## 2019-11-18 NOTE — Patient Instructions (Signed)
Ms. Lori Trevino , Thank you for taking time to come for your Medicare Wellness Visit. I appreciate your ongoing commitment to your health goals. Please review the following plan we discussed and let me know if I can assist you in the future.   Screening recommendations/referrals: Colonoscopy: Cologuard ordered today.  Mammogram: Please call [phone] to schedule your mammogram and bone density screening.   Recommended yearly ophthalmology/optometry visit for glaucoma screening and checkup Recommended yearly dental visit for hygiene and checkup  Vaccinations: Influenza vaccine: due Pneumococcal vaccine: done 10/27/17. Due for second dose.  Tdap vaccine: done 07/16/14 Shingles vaccine: Shingrix discussed. Please contact your pharmacy for coverage information.   Advanced directives: Advance directive discussed with you today. I have provided a copy for you to complete at home and have notarized. Once this is complete please bring a copy in to our office so we can scan it into your chart.  Conditions/risks identified: Recommend drinking 6-8 glasses of water per day  Next appointment: Please follow up in one year for your Medicare Annual Wellness visit.     Preventive Care 71 Years and Older, Female Preventive care refers to lifestyle choices and visits with your health care provider that can promote health and wellness. What does preventive care include?  A yearly physical exam. This is also called an annual well check.  Dental exams once or twice a year.  Routine eye exams. Ask your health care provider how often you should have your eyes checked.  Personal lifestyle choices, including:  Daily care of your teeth and gums.  Regular physical activity.  Eating a healthy diet.  Avoiding tobacco and drug use.  Limiting alcohol use.  Practicing safe sex.  Taking low-dose aspirin every day.  Taking vitamin and mineral supplements as recommended by your health care provider. What  happens during an annual well check? The services and screenings done by your health care provider during your annual well check will depend on your age, overall health, lifestyle risk factors, and family history of disease. Counseling  Your health care provider may ask you questions about your:  Alcohol use.  Tobacco use.  Drug use.  Emotional well-being.  Home and relationship well-being.  Sexual activity.  Eating habits.  History of falls.  Memory and ability to understand (cognition).  Work and work Statistician.  Reproductive health. Screening  You may have the following tests or measurements:  Height, weight, and BMI.  Blood pressure.  Lipid and cholesterol levels. These may be checked every 5 years, or more frequently if you are over 21 years old.  Skin check.  Lung cancer screening. You may have this screening every year starting at age 23 if you have a 30-pack-year history of smoking and currently smoke or have quit within the past 15 years.  Fecal occult blood test (FOBT) of the stool. You may have this test every year starting at age 44.  Flexible sigmoidoscopy or colonoscopy. You may have a sigmoidoscopy every 5 years or a colonoscopy every 10 years starting at age 69.  Hepatitis C blood test.  Hepatitis B blood test.  Sexually transmitted disease (STD) testing.  Diabetes screening. This is done by checking your blood sugar (glucose) after you have not eaten for a while (fasting). You may have this done every 1-3 years.  Bone density scan. This is done to screen for osteoporosis. You may have this done starting at age 39.  Mammogram. This may be done every 1-2 years. Talk to your health  care provider about how often you should have regular mammograms. Talk with your health care provider about your test results, treatment options, and if necessary, the need for more tests. Vaccines  Your health care provider may recommend certain vaccines, such  as:  Influenza vaccine. This is recommended every year.  Tetanus, diphtheria, and acellular pertussis (Tdap, Td) vaccine. You may need a Td booster every 10 years.  Zoster vaccine. You may need this after age 57.  Pneumococcal 13-valent conjugate (PCV13) vaccine. One dose is recommended after age 44.  Pneumococcal polysaccharide (PPSV23) vaccine. One dose is recommended after age 14. Talk to your health care provider about which screenings and vaccines you need and how often you need them. This information is not intended to replace advice given to you by your health care provider. Make sure you discuss any questions you have with your health care provider. Document Released: 10/16/2015 Document Revised: 06/08/2016 Document Reviewed: 07/21/2015 Elsevier Interactive Patient Education  2017 [place] Prevention in the Home Falls can cause injuries. They can happen to people of all ages. There are many things you can do to make your home safe and to help prevent falls. What can I do on the outside of my home?  Regularly fix the edges of walkways and driveways and fix any cracks.  Remove anything that might make you trip as you walk through a door, such as a raised step or threshold.  Trim any bushes or trees on the path to your home.  Use bright outdoor lighting.  Clear any walking paths of anything that might make someone trip, such as rocks or tools.  Regularly check to see if handrails are loose or broken. Make sure that both sides of any steps have handrails.  Any raised decks and porches should have guardrails on the edges.  Have any leaves, snow, or ice cleared regularly.  Use sand or salt on walking paths during winter.  Clean up any spills in your garage right away. This includes oil or grease spills. What can I do in the bathroom?  Use night lights.  Install grab bars by the toilet and in the tub and shower. Do not use towel bars as grab bars.  Use  non-skid mats or decals in the tub or shower.  If you need to sit down in the shower, use a plastic, non-slip stool.  Keep the floor dry. Clean up any water that spills on the floor as soon as it happens.  Remove soap buildup in the tub or shower regularly.  Attach bath mats securely with double-sided non-slip rug tape.  Do not have throw rugs and other things on the floor that can make you trip. What can I do in the bedroom?  Use night lights.  Make sure that you have a light by your bed that is easy to reach.  Do not use any sheets or blankets that are too big for your bed. They should not hang down onto the floor.  Have a firm chair that has side arms. You can use this for support while you get dressed.  Do not have throw rugs and other things on the floor that can make you trip. What can I do in the kitchen?  Clean up any spills right away.  Avoid walking on wet floors.  Keep items that you use a lot in easy-to-reach places.  If you need to reach something above you, use a strong step stool that has a grab  bar.  Keep electrical cords out of the way.  Do not use floor polish or wax that makes floors slippery. If you must use wax, use non-skid floor wax.  Do not have throw rugs and other things on the floor that can make you trip. What can I do with my stairs?  Do not leave any items on the stairs.  Make sure that there are handrails on both sides of the stairs and use them. Fix handrails that are broken or loose. Make sure that handrails are as long as the stairways.  Check any carpeting to make sure that it is firmly attached to the stairs. Fix any carpet that is loose or worn.  Avoid having throw rugs at the top or bottom of the stairs. If you do have throw rugs, attach them to the floor with carpet tape.  Make sure that you have a light switch at the top of the stairs and the bottom of the stairs. If you do not have them, ask someone to add them for you. What  else can I do to help prevent falls?  Wear shoes that:  Do not have high heels.  Have rubber bottoms.  Are comfortable and fit you well.  Are closed at the toe. Do not wear sandals.  If you use a stepladder:  Make sure that it is fully opened. Do not climb a closed stepladder.  Make sure that both sides of the stepladder are locked into place.  Ask someone to hold it for you, if possible.  Clearly mark and make sure that you can see:  Any grab bars or handrails.  First and last steps.  Where the edge of each step is.  Use tools that help you move around (mobility aids) if they are needed. These include:  Canes.  Walkers.  Scooters.  Crutches.  Turn on the lights when you go into a dark area. Replace any light bulbs as soon as they burn out.  Set up your furniture so you have a clear path. Avoid moving your furniture around.  If any of your floors are uneven, fix them.  If there are any pets around you, be aware of where they are.  Review your medicines with your doctor. Some medicines can make you feel dizzy. This can increase your chance of falling. Ask your doctor what other things that you can do to help prevent falls. This information is not intended to replace advice given to you by your health care provider. Make sure you discuss any questions you have with your health care provider. Document Released: 07/16/2009 Document Revised: 02/25/2016 Document Reviewed: 10/24/2014 Elsevier Interactive Patient Education  2017 [employer].

## 2019-12-06 LAB — COLOGUARD

## 2019-12-11 LAB — COLOGUARD
COLOGUARD: NEGATIVE
Cologuard: NEGATIVE

## 2019-12-11 LAB — EXTERNAL GENERIC LAB PROCEDURE: COLOGUARD: NEGATIVE

## 2019-12-17 ENCOUNTER — Telehealth: Payer: Self-pay | Admitting: Internal Medicine

## 2019-12-17 NOTE — Telephone Encounter (Signed)
Cologuard test negative.  Repeat in 3 yrs.

## 2019-12-17 NOTE — Telephone Encounter (Signed)
Patient informed on VM of negative cologuard and we will repeat in 3 years. Told to call back with any questions.

## 2019-12-18 ENCOUNTER — Other Ambulatory Visit: Payer: Self-pay

## 2019-12-18 ENCOUNTER — Encounter: Payer: Self-pay | Admitting: Emergency Medicine

## 2019-12-18 ENCOUNTER — Ambulatory Visit
Admission: EM | Admit: 2019-12-18 | Discharge: 2019-12-18 | Disposition: A | Discharge status: Home / Self Care | Payer: Medicare Other | Attending: Family Medicine | Admitting: Family Medicine

## 2019-12-18 DIAGNOSIS — R600 Localized edema: Secondary | ICD-10-CM | POA: Insufficient documentation

## 2019-12-18 DIAGNOSIS — R609 Edema, unspecified: Secondary | ICD-10-CM | POA: Diagnosis not present

## 2019-12-18 DIAGNOSIS — Z79899 Other long term (current) drug therapy: Secondary | ICD-10-CM | POA: Insufficient documentation

## 2019-12-18 DIAGNOSIS — Z823 Family history of stroke: Secondary | ICD-10-CM | POA: Insufficient documentation

## 2019-12-18 DIAGNOSIS — F17210 Nicotine dependence, cigarettes, uncomplicated: Secondary | ICD-10-CM | POA: Insufficient documentation

## 2019-12-18 DIAGNOSIS — Z888 Allergy status to other drugs, medicaments and biological substances status: Secondary | ICD-10-CM | POA: Insufficient documentation

## 2019-12-18 LAB — CBC WITH DIFFERENTIAL/PLATELET
Abs Immature Granulocytes: 0.02 10*3/uL (ref 0.00–0.07)
Basophils Absolute: 0 10*3/uL (ref 0.0–0.1)
Basophils Relative: 1 %
Eosinophils Absolute: 0.1 10*3/uL (ref 0.0–0.5)
Eosinophils Relative: 1 %
HCT: 39 % (ref 36.0–46.0)
Hemoglobin: 13 g/dL (ref 12.0–15.0)
Immature Granulocytes: 0 %
Lymphocytes Relative: 17 %
Lymphs Abs: 1 10*3/uL (ref 0.7–4.0)
MCH: 31 pg (ref 26.0–34.0)
MCHC: 33.3 g/dL (ref 30.0–36.0)
MCV: 92.9 fL (ref 80.0–100.0)
Monocytes Absolute: 0.4 10*3/uL (ref 0.1–1.0)
Monocytes Relative: 6 %
Neutro Abs: 4.4 10*3/uL (ref 1.7–7.7)
Neutrophils Relative %: 75 %
Platelets: 244 10*3/uL (ref 150–400)
RBC: 4.2 MIL/uL (ref 3.87–5.11)
RDW: 12.6 % (ref 11.5–15.5)
WBC: 5.9 10*3/uL (ref 4.0–10.5)
nRBC: 0 % (ref 0.0–0.2)

## 2019-12-18 LAB — COMPREHENSIVE METABOLIC PANEL
ALT: 18 U/L (ref 0–44)
AST: 24 U/L (ref 15–41)
Albumin: 4.6 g/dL (ref 3.5–5.0)
Alkaline Phosphatase: 52 U/L (ref 38–126)
Anion gap: 6 (ref 5–15)
BUN: 15 mg/dL (ref 8–23)
CO2: 31 mmol/L (ref 22–32)
Calcium: 9.1 mg/dL (ref 8.9–10.3)
Chloride: 95 mmol/L — ABNORMAL LOW (ref 98–111)
Creatinine, Ser: 0.58 mg/dL (ref 0.44–1.00)
GFR calc Af Amer: >60 mL/min (ref >60)
GFR calc non Af Amer: >60 mL/min (ref >60)
Glucose, Bld: 107 mg/dL — ABNORMAL HIGH (ref 70–99)
Potassium: 3.9 mmol/L (ref 3.5–5.1)
Sodium: 132 mmol/L — ABNORMAL LOW (ref 135–145)
Total Bilirubin: 0.8 mg/dL (ref 0.3–1.2)
Total Protein: 7.5 g/dL (ref 6.5–8.1)

## 2019-12-18 LAB — BRAIN NATRIURETIC PEPTIDE: B Natriuretic Peptide: 40 pg/mL (ref 0.0–100.0)

## 2019-12-18 MED ORDER — ONDANSETRON HCL 4 MG PO TABS: 4.0000 mg | ORAL_TABLET | Freq: Three times a day (TID) | ORAL | 0 refills | Status: DC | PRN

## 2019-12-18 MED ORDER — HYDROCHLOROTHIAZIDE 25 MG PO TABS: 25.0000 mg | ORAL_TABLET | Freq: Every day | ORAL | 0 refills | Status: DC

## 2019-12-18 NOTE — ED Provider Notes (Signed)
[facility]    CSN: [id number] Arrival date & time: 12/18/19  0801   History   Chief Complaint Chief Complaint  Patient presents with  . Leg Swelling   HPI  69 year old female presents with lower extremity edema.  Patient reports that she noted edema in her lower legs last night.  She states that it has persisted.  She states that she is slightly short of breath.  No reports of chest pain.  Patient states that she does not feel well this morning.  She just came from the methadone clinic.  She states that she does not feel well after she goes.  She believes that she is nauseated.  Patient is currently on amlodipine for hypertension.  This could be contributing.  No other associated symptoms.  No other complaints or concerns at this time.  Patient Active Problem List   Diagnosis Date Noted  . Sensorineural hearing loss (SNHL) of both ears 09/17/2019  . Essential hypertension 08/23/2019  . Narcotic addiction ([place]) 08/23/2019  . Psychophysiological insomnia 08/23/2019  . Tinnitus of both ears 08/23/2019  . Elevated glucose level 02/05/2018  . Gait instability 10/27/2017  . Environmental and seasonal allergies 02/14/2017  . Anxiety about health 12/10/2015  . Mixed hyperlipidemia 12/08/2015  . 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 Chronic pain 05/02/2015  . Osteoarthritis 06/16/2014  . Radiculopathy of lumbar region 06/16/2014  . 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 Chronic insomnia 06/12/2014  . Vitamin D deficiency 05/31/2014  . Neuralgia and neuritis 06/25/2013  . Arthritis, midfoot 06/18/2013   Past Surgical History:  Procedure Laterality Date  . AXILLARY SURGERY Right 2008  . CHOLECYSTECTOMY    . CYSTOCELE REPAIR N/A 02/08/2016   Procedure: ANTERIOR REPAIR (CYSTOCELE);  Surgeon: Boykin Nearing, MD;  Location: [facility] ORS;  Service: Gynecology;  Laterality: N/A;  . LAPAROSCOPIC GASTRIC SLEEVE RESECTION WITH HIATAL HERNIA REPAIR  2015    . TONSILLECTOMY    . TUBAL LIGATION    . VAGINAL HYSTERECTOMY N/A 02/08/2016   Procedure: HYSTERECTOMY VAGINAL WITH BSO;  Surgeon: Boykin Nearing, MD;  Location: [facility] ORS;  Service: Gynecology;  Laterality: N/A;  . VARICOSE VEIN SURGERY     OB History   No obstetric history on file.     Home Medications    Prior to Admission medications   Medication Sig Start Date End Date Taking? Authorizing Provider  busPIRone (BUSPAR) 5 MG tablet Take 10 mg by mouth 3 (three) times daily. 10/24/19  Yes [provider]  loratadine (CLARITIN) 10 MG tablet Take 10 mg by mouth daily as needed for allergies.   Yes [provider]  methadone (DOLOPHINE) 1 mg/ml oral solution Take by mouth. 70 MLs   Yes [provider]  Multiple Vitamin (ONE-A-DAY 55 PLUS PO) Take 1 tablet by mouth daily.   Yes [provider]  zinc gluconate 50 MG tablet Take 50 mg by mouth daily.   Yes [provider]  amLODipine (NORVASC) 5 MG tablet TAKE 1 TABLET (5 MG TOTAL) BY MOUTH DAILY. FOR HIGH BLOOD PRESSURE 09/09/19 12/18/19 Yes Glean Hess, MD  B Complex-Biotin-FA (B-COMPLEX PO) Take 1 tablet by mouth daily.    [provider]  hydrochlorothiazide (HYDRODIURIL) 25 MG tablet Take 1 tablet (25 mg total) by mouth daily. 12/18/19   Coral Spikes, DO  ondansetron (ZOFRAN) 4 MG tablet Take 1 tablet (4 mg total) by mouth every 8 (eight) hours as needed for nausea  or vomiting. 12/18/19   Coral Spikes, DO    Family History Family History  Problem Relation Age of Onset  . Stroke Mother   . Cancer Father   . Breast cancer Neg Hx     Social History Social History   Tobacco Use  . Smoking status: Current Some Day Smoker    Types: Cigarettes  . Smokeless tobacco: Never Used  Substance Use Topics  . Alcohol use: Not Currently    Alcohol/week: 0.0 standard drinks  . Drug use: No     Allergies   Neurontin [gabapentin] and Zolpidem   Review of Systems Review of  Systems  Constitutional: Negative.   Respiratory: Positive for shortness of breath.   Cardiovascular: Positive for leg swelling.  Gastrointestinal: Positive for nausea.   Physical Exam Triage Vital Signs ED Triage Vitals  Enc Vitals Group     BP 12/18/19 0813 (!) 156/81     Pulse Rate 12/18/19 0813 77     Resp 12/18/19 0813 18     Temp 12/18/19 0813 98.1 F (36.7 C)     Temp Source 12/18/19 0813 Oral     SpO2 12/18/19 0813 100 %     Weight 12/18/19 0811 145 lb (65.8 kg)     Height --      Head Circumference --      Peak Flow --      Pain Score 12/18/19 0811 0     Pain Loc --      Pain Edu? --      Excl. in [place]? --    Updated Vital Signs BP (!) 156/81 (BP Location: Right Arm)   Pulse 77   Temp 98.1 F (36.7 C) (Oral)   Resp 18   Wt 65.8 kg   SpO2 100%   BMI 26.52 kg/m   Visual Acuity Right Eye Distance:   Left Eye Distance:   Bilateral Distance:    Right Eye Near:   Left Eye Near:    Bilateral Near:     Physical Exam Vitals and nursing note reviewed.  Constitutional:      General: She is not in acute distress.    Appearance: Normal appearance. She is not ill-appearing.  HENT:     Head: Normocephalic and atraumatic.  Eyes:     General:        Right eye: No discharge.        Left eye: No discharge.     Conjunctiva/sclera: Conjunctivae normal.  Cardiovascular:     Rate and Rhythm: Normal rate and regular rhythm.     Heart sounds: No murmur.  Pulmonary:     Effort: Pulmonary effort is normal.     Breath sounds: Normal breath sounds. No wheezing, rhonchi or rales.  Musculoskeletal:     Comments: 1-2+ Bilateral lower extremity edema.  Neurological:     Mental Status: She is alert.  Psychiatric:        Mood and Affect: Mood normal.        Behavior: Behavior normal.    UC Treatments / Results  Labs (all labs ordered are listed, but only abnormal results are displayed) Labs Reviewed  COMPREHENSIVE METABOLIC PANEL - Abnormal; Notable for the following  components:      Result Value   Sodium 132 (*)    Chloride 95 (*)    Glucose, Bld 107 (*)    All other components within normal limits  CBC WITH DIFFERENTIAL/PLATELET  BRAIN NATRIURETIC PEPTIDE    EKG  Radiology No results found.  Procedures Procedures (including critical care time)  Medications Ordered in UC Medications - No data to display  Initial Impression / Assessment and Plan / UC Course  I have reviewed the triage vital signs and the nursing notes.  Pertinent labs & imaging results that were available during my care of the patient were reviewed by me and considered in my medical decision making (see chart for details).    69 year old female presents with peripheral edema.  Likely secondary to venous stasis and amlodipine.  Stopping amlodipine.  Starting HCTZ.  Labs essentially unremarkable today.  Zofran as needed for nausea.  Supportive care.  Final Clinical Impressions(s) / UC Diagnoses   Final diagnoses:  Peripheral edema     Discharge Instructions     Labs stable.  Stop Amlodipine.   Start HCTZ.  Follow up with PCP.  Take care  Dr. Lacinda Axon     ED Prescriptions    Medication Sig Dispense Auth. Provider   hydrochlorothiazide (HYDRODIURIL) 25 MG tablet Take 1 tablet (25 mg total) by mouth daily. 90 tablet Kynnedy Carreno G, DO   ondansetron (ZOFRAN) 4 MG tablet Take 1 tablet (4 mg total) by mouth every 8 (eight) hours as needed for nausea or vomiting. 20 tablet Coral Spikes, DO     PDMP not reviewed this encounter.   Coral Spikes, DO 12/18/19 0900

## 2019-12-18 NOTE — Discharge Instructions (Addendum)
Labs stable.  Stop Amlodipine.   Start HCTZ.  Follow up with PCP.  Take care  Dr. Lacinda Axon

## 2019-12-18 NOTE — ED Triage Notes (Signed)
Patient states she started having swelling in her lower legs yesterday. She denies pain. She states she just came from the Methadone clinic this morning and is also feeling nauseated.

## 2019-12-19 ENCOUNTER — Encounter: Payer: Self-pay | Admitting: Internal Medicine

## 2019-12-20 ENCOUNTER — Ambulatory Visit: Payer: Medicare Other | Admitting: Internal Medicine

## 2020-01-17 ENCOUNTER — Ambulatory Visit: Payer: Medicare Other | Admitting: Internal Medicine

## 2020-01-20 ENCOUNTER — Ambulatory Visit: Payer: Medicare Other | Admitting: Internal Medicine

## 2020-01-29 ENCOUNTER — Other Ambulatory Visit: Payer: Self-pay | Admitting: Internal Medicine

## 2020-01-29 NOTE — Telephone Encounter (Signed)
Requested medication (s) are due for refill today: no  Requested medication (s) are on the active medication list: no  Last refill:  12/09/19  Future visit scheduled: no  Notes to clinic:  D/C'd by Thersa Salt, DO   Requested Prescriptions  Pending Prescriptions Disp Refills   amLODipine (NORVASC) 5 MG tablet [Pharmacy Med Name: AMLODIPINE BESYLATE 5 MG TAB] 90 tablet 1    Sig: TAKE 1 TABLET (5 MG TOTAL) BY MOUTH DAILY. FOR HIGH BLOOD PRESSURE      Cardiovascular:  Calcium Channel Blockers Failed - 01/29/2020 10:48 AM      Failed - Last BP in normal range    BP Readings from Last 1 Encounters:  12/18/19 (!) 156/81          Passed - Valid encounter within last 6 months    Recent Outpatient Visits           3 months ago Sensorineural hearing loss (SNHL) of both ears   [facility] Glean Hess, MD   4 months ago Essential hypertension   [facility] Glean Hess, MD   5 months ago Tinnitus of both ears   [facility] Glean Hess, MD   1 year ago Finger swelling   [facility] Glean Hess, MD   2 years ago Radiculopathy of lumbar region   [facility] [name], MD

## 2020-02-04 ENCOUNTER — Ambulatory Visit: Payer: Self-pay | Admitting: *Deleted

## 2020-02-04 NOTE — Telephone Encounter (Signed)
Patient is concerned about her swelling in legs and feet. Patient has had swelling previous- but this is worse and she has started having pinkness in shins. Elevating is not helping. Call to office- patient scheduled for TH-10:20. Patient advised if swelling increases, legs turn red, trouble breathing or chest pain go to UC/ED.  Reason for Disposition . [1] MODERATE leg swelling (e.g., swelling extends up to knees) AND [2] new onset or worsening  Answer Assessment - Initial Assessment Questions 1. ONSET: "When did the swelling start?" (e.g., minutes, hours, days)     Started 1-2 weeks ago- off/on 2. LOCATION: "What part of the leg is swollen?"  "Are both legs swollen or just one leg?"     Bilateral swelling 3. SEVERITY: "How bad is the swelling?" (e.g., localized; mild, moderate, severe)  - Localized - small area of swelling localized to one leg  - MILD pedal edema - swelling limited to foot and ankle, pitting edema < 1/4 inch (6 mm) deep, rest and elevation eliminate most or all swelling  - MODERATE edema - swelling of lower leg to knee, pitting edema > 1/4 inch (6 mm) deep, rest and elevation only partially reduce swelling  - SEVERE edema - swelling extends above knee, facial or hand swelling present      Mild/moderate 4. REDNESS: "Does the swelling look red or infected?"     Pinkness in shins 5. PAIN: "Is the swelling painful to touch?" If so, ask: "How painful is it?"   (Scale 1-10; mild, moderate or severe)     Not painful- uncomfortable 6. FEVER: "Do you have a fever?" If so, ask: "What is it, how was it measured, and when did it start?"      No fever 7. CAUSE: "What do you think is causing the leg swelling?"     Not sure- had improvement with new BP medication 8. MEDICAL HISTORY: "Do you have a history of heart failure, kidney disease, liver failure, or cancer?"     No- heart disease 9. RECURRENT SYMPTOM: "Have you had leg swelling before?" If so, ask: "When was the last time?" "What  happened that time?"     yes 10. OTHER SYMPTOMS: "Do you have any other symptoms?" (e.g., chest pain, difficulty breathing)       no 11. PREGNANCY: "Is there any chance you are pregnant?" "When was your last menstrual period?"       n/a  Protocols used: LEG SWELLING AND EDEMA-A-AH

## 2020-02-06 ENCOUNTER — Encounter: Payer: Self-pay | Admitting: Internal Medicine

## 2020-02-06 ENCOUNTER — Ambulatory Visit (INDEPENDENT_AMBULATORY_CARE_PROVIDER_SITE_OTHER): Payer: Medicare Other | Admitting: Internal Medicine

## 2020-02-06 ENCOUNTER — Other Ambulatory Visit: Payer: Self-pay

## 2020-02-06 VITALS — BP 136/82 | HR 76 | Temp 97.3°F | Ht 62.0 in | Wt 154.0 lb

## 2020-02-06 DIAGNOSIS — F419 Anxiety disorder, unspecified: Secondary | ICD-10-CM

## 2020-02-06 DIAGNOSIS — F331 Major depressive disorder, recurrent, moderate: Secondary | ICD-10-CM

## 2020-02-06 DIAGNOSIS — F1120 Opioid dependence, uncomplicated: Secondary | ICD-10-CM | POA: Diagnosis not present

## 2020-02-06 DIAGNOSIS — H9313 Tinnitus, bilateral: Secondary | ICD-10-CM

## 2020-02-06 DIAGNOSIS — J3089 Other allergic rhinitis: Secondary | ICD-10-CM

## 2020-02-06 DIAGNOSIS — I10 Essential (primary) hypertension: Secondary | ICD-10-CM | POA: Diagnosis not present

## 2020-02-06 MED ORDER — FLUTICASONE PROPIONATE 50 MCG/ACT NA SUSP: 1.0000 | Freq: Every day | NASAL | 1 refills | Status: DC

## 2020-02-06 MED ORDER — ONDANSETRON 8 MG PO TBDP: 8.0000 mg | ORAL_TABLET | Freq: Three times a day (TID) | ORAL | 1 refills | Status: DC | PRN

## 2020-02-06 MED ORDER — HYDROCHLOROTHIAZIDE 25 MG PO TABS: 25.0000 mg | ORAL_TABLET | Freq: Every day | ORAL | 1 refills | Status: DC

## 2020-02-06 NOTE — Progress Notes (Signed)
Date:  02/06/2020   Name:  Lori Trevino   DOB:  [birth year]   MRN:  [id number]   Chief Complaint: Edema (Bilateral leg swelling. Now not swelling. She cut out all the coffee- was drinking 30 cups of coffee and cut out some sugar and all salt. )  Immunization History  Administered Date(s) Administered  . Influenza, High Dose Seasonal PF 10/27/2017  . Influenza-Unspecified 09/08/2015, 10/22/2016  . Moderna SARS-COVID-2 Vaccination 12/02/2019, 01/02/2020  . Pneumococcal Conjugate-13 10/27/2017  . Tdap 07/16/2014    Hypertension This is a chronic problem. The problem is controlled. Associated symptoms include peripheral edema (resolved when she cut back from 13 cups of coffee per day to 2-3). Pertinent negatives include no chest pain, headaches, palpitations or shortness of breath. Past treatments include diuretics. The current treatment provides significant improvement.  Dizziness This is a recurrent problem. The problem has been unchanged. Associated symptoms include nausea and vertigo. Pertinent negatives include no chest pain, chills, coughing, fatigue, fever or headaches. Treatments tried: zofran as needed.  Depression        This is a recurrent problem.  The problem has been gradually improving since onset.  Associated symptoms include no fatigue and no headaches.  Treatments tried: buspar from Dr[facility] Verl Blalock at [facility]. Allergies - uses Flonase for sinus congestion. The congestion also contributes to her recurrent dizziness/vertigo.  Lab Results  Component Value Date   CREATININE 0.58 12/18/2019   BUN 15 12/18/2019   NA 132 (L) 12/18/2019   K 3.9 12/18/2019   CL 95 (L) 12/18/2019   CO2 31 12/18/2019   Lab Results  Component Value Date   CHOL 244 (H) 10/27/2017   HDL 116 10/27/2017   LDLCALC 116 (H) 10/27/2017   TRIG 59 10/27/2017   CHOLHDL 2.1 10/27/2017   Lab Results  Component Value Date   TSH 0.567 10/27/2017   No results found for: HGBA1C Lab Results  Component Value Date     WBC 5.9 12/18/2019   HGB 13.0 12/18/2019   HCT 39.0 12/18/2019   MCV 92.9 12/18/2019   PLT 244 12/18/2019   Lab Results  Component Value Date   ALT 18 12/18/2019   AST 24 12/18/2019   ALKPHOS 52 12/18/2019   BILITOT 0.8 12/18/2019     Review of Systems  Constitutional: Negative for chills, fatigue and fever.  HENT: Positive for tinnitus.   Respiratory: Negative for cough, chest tightness, shortness of breath and wheezing.   Cardiovascular: Positive for leg swelling. Negative for chest pain and palpitations.  Gastrointestinal: Positive for nausea.  Neurological: Positive for dizziness and vertigo. Negative for light-headedness and headaches.  Psychiatric/Behavioral: Positive for depression, dysphoric mood and sleep disturbance. The patient is nervous/anxious.     Patient Active Problem List   Diagnosis Date Noted  . Sensorineural hearing loss (SNHL) of both ears 09/17/2019  . Essential hypertension 08/23/2019  . Narcotic addiction ([place]) 08/23/2019  . Psychophysiological insomnia 08/23/2019  . Tinnitus of both ears 08/23/2019  . Elevated glucose level 02/05/2018  . Gait instability 10/27/2017  . Environmental and seasonal allergies 02/14/2017  . Anxiety about health 12/10/2015  . Mixed hyperlipidemia 12/08/2015  . 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 Chronic pain 05/02/2015  . Osteoarthritis 06/16/2014  . Radiculopathy of lumbar region 06/16/2014  . 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 Chronic insomnia 06/12/2014  . Vitamin D deficiency 05/31/2014  . Neuralgia and neuritis 06/25/2013  . Arthritis, midfoot 06/18/2013    Allergies  Allergen Reactions  . Neurontin [Gabapentin] Swelling  . Zolpidem Other (See Comments)    sleepwalking     Past Surgical History:  Procedure Laterality Date  . AXILLARY SURGERY Right 2008  . CHOLECYSTECTOMY    . CYSTOCELE REPAIR N/A 02/08/2016   Procedure: ANTERIOR REPAIR (CYSTOCELE);  Surgeon: Boykin Nearing, MD;  Location: [facility] ORS;  Service: Gynecology;  Laterality: N/A;  . LAPAROSCOPIC GASTRIC SLEEVE RESECTION WITH HIATAL HERNIA REPAIR  2015  . TONSILLECTOMY    . TUBAL LIGATION    . VAGINAL HYSTERECTOMY N/A 02/08/2016   Procedure: HYSTERECTOMY VAGINAL WITH BSO;  Surgeon: Boykin Nearing, MD;  Location: [facility] OR[facility];  Service: Gynecology;  Laterality: N/A;  . VARICOSE VEIN SURGERY      Social History   Tobacco Use  . Smoking status: Current Every Day Smoker    Packs/day: 0.10    Years: 60.00    Pack years: 6.00    Types: Cigarettes  . Smokeless tobacco: Never Used  . Tobacco comment: 2-3 daily  Substance Use Topics  . Alcohol use: Not Currently    Alcohol/week: 0.0 standard drinks  . Drug use: No     Medication list has been reviewed and updated.  Current Meds  Medication Sig  . B Complex-Biotin-FA (B-COMPLEX PO) Take 1 tablet by mouth daily.  . busPIRone (BUSPAR) 5 MG tablet Take 10 mg by mouth 3 (three) times daily.  . fluticasone (FLONASE) 50 MCG/ACT nasal spray Place into both nostrils daily.  . hydrochlorothiazide (HYDRODIURIL) 25 MG tablet Take 1 tablet (25 mg total) by mouth daily.  Marland Kitchen loratadine (CLARITIN) 10 MG tablet Take 10 mg by mouth daily as needed for allergies.  . methadone (DOLOPHINE) 1 mg/ml oral solution Take by mouth. 70 MLs  . Multiple Vitamin (ONE-A-DAY 55 PLUS PO) Take 1 tablet by mouth daily.  . ondansetron (ZOFRAN-ODT) 8 MG disintegrating tablet Take 8 mg by mouth every 8 (eight) hours as needed for nausea or vomiting.  . zinc gluconate 50 MG tablet Take 50 mg by mouth daily.    PHQ 2/9 Scores 02/06/2020 11/18/2019 10/07/2019 09/17/2019  PHQ - 2 Score 4 0 4 6  PHQ- 9 Score 9 7 17 16     BP Readings from Last 3 Encounters:  02/06/20 136/82  12/18/19 (!) 156/81  10/07/19 126/76    Physical Exam Vitals and nursing note reviewed.  Constitutional:      General: She is not in acute distress.    Appearance: She is well-developed.  HENT:       Head: Normocephalic and atraumatic.  Cardiovascular:     Rate and Rhythm: Normal rate and regular rhythm. Occasional extrasystoles are present.    Heart sounds: Normal heart sounds.  Pulmonary:     Effort: Pulmonary effort is normal. No respiratory distress.     Breath sounds: Normal breath sounds. No wheezing.  Musculoskeletal:        General: Normal range of motion.     Right lower leg: 1+ Edema present.     Left lower leg: 1+ Edema present.  Skin:    General: Skin is warm and dry.     Findings: No rash.  Neurological:     Mental Status: She is alert and oriented to person, place, and time.  Psychiatric:        Attention and Perception: Attention normal.        Mood and Affect: Mood is anxious.        Speech: Speech normal.  Behavior: Behavior normal.        Thought Content: Thought content includes suicidal (passive thoughts of being better off dead) ideation. Thought content does not include suicidal plan.     Wt Readings from Last 3 Encounters:  02/06/20 154 lb (69.9 kg)  12/18/19 145 lb (65.8 kg)  11/18/19 143 lb (64.9 kg)    BP 136/82   Pulse 76   Temp (!) 97.3 F (36.3 C) (Temporal)   Ht 5\' 2"  (1.575 m)   Wt 154 lb (69.9 kg)   SpO2 99%   BMI 28.17 kg/m   Assessment and Plan: 1. Environmental and seasonal allergies Can use Flonase as needed daily - fluticasone (FLONASE) 50 MCG/ACT nasal spray; Place 1 spray into both nostrils daily.  Dispense: 16 g; Refill: 1  2. Essential hypertension Clinically stable exam with well controlled BP on hctz. Tolerating medications without side effects at this time. Unsure of the cause of edema but it is not much improved with diet changes Pt to continue current regimen and low sodium diet; benefits of regular exercise as able discussed. - hydrochlorothiazide (HYDRODIURIL) 25 MG tablet; Take 1 tablet (25 mg total) by mouth daily.  Dispense: 90 tablet; Refill: 1  3. Tinnitus of both ears Will give a few zofran to  reduce n/v associated with recurrent tinnitus/vertigo - ondansetron (ZOFRAN-ODT) 8 MG disintegrating tablet; Take 1 tablet (8 mg total) by mouth every 8 (eight) hours as needed for nausea or vomiting.  Dispense: 30 tablet; Refill: 1  4. Narcotic addiction [facility]) She continues on daily Methadone through the methadone clinic  5. 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[place]) Currently under the care of psychiatry at [place] mainly for anxiety; not on any antidepressant She exhibits some suicidal thoughts without intent at this time   Partially dictated using Editor, commissioning. Any errors are unintentional.  Halina Maidens, MD [facility]  02/06/2020

## 2020-02-06 NOTE — Patient Instructions (Signed)
Suicidal Feelings: How to Help Yourself Suicide is when you end your own life. There are many things you can do to help yourself feel better when struggling with these feelings. Many services and people are available to support you and others who struggle with similar feelings.  If you ever feel like you may hurt yourself or others, or have thoughts about taking your own life, get help right away. To get help:  Call your local emergency services (911 in the [place]  The [employer]'s health and human services helpline (211 in the [place]  Go to your nearest emergency department.  Call a suicide hotline to speak with a trained counselor. The following suicide hotlines are available in the [place]: ? 1-800-273-TALK (1-800-273-8255). ? 1-800-SUICIDE (1-800-784-2433). ? 1-888-628-9454. This is a hotline for Spanish speakers. ? 1-800-799-4889. This is a hotline for TTY users. ? 1-866-4-U-TREVOR (1-866-488-7386). This is a hotline for lesbian, gay, bisexual, transgender, or questioning youth. ? For a list of hotlines in [place], visit www.suicide.org/hotlines/international/[place]-suicide-hotlines.html  Contact a crisis center or a local suicide prevention center. To find a crisis center or suicide prevention center: ? Call your local hospital, clinic, community service organization, mental health center, social service provider, or health department. Ask for help with connecting to a crisis center. ? For a list of crisis centers in the [place], visit: suicidepreventionlifeline.org ? For a list of crisis centers in [place], visit: suicideprevention.ca How to help yourself feel better   Promise yourself that you will not do anything extreme when you have suicidal feelings. Remember, there is hope. Many people have gotten through suicidal thoughts and feelings, and you can too. If you have had these feelings before, remind yourself that you can get through them again.  Let family, friends,  teachers, or counselors know how you are feeling. Try not to separate yourself from those who care about you and want to help you. Talk with someone every day, even if you do not feel sociable. Face-to-face conversation is best to help them understand your feelings.  Contact a mental health care provider and work with this person regularly.  Make a safety plan that you can follow during a crisis. Include phone numbers of suicide prevention hotlines, mental health professionals, and trusted friends and family members you can call during an emergency. Save these numbers on your phone.  If you are thinking of taking a lot of medicine, give your medicine to someone who can give it to you as prescribed. If you are on antidepressants and are concerned you will overdose, tell your health care provider so that he or she can give you safer medicines.  Try to stick to your routines. Follow a schedule every day. Make self-care a priority.  Make a list of realistic goals, and cross them off when you achieve them. Accomplishments can give you a sense of worth.  Wait until you are feeling better before doing things that you find difficult or unpleasant.  Do things that you have always enjoyed to take your mind off your feelings. Try reading a book, or listening to or playing music. Spending time outside, in nature, may help you feel better. Follow these instructions at home:   Visit your primary health care provider every year for a checkup.  Work with a mental health care provider as needed.  Eat a well-balanced diet, and eat regular meals.  Get plenty of rest.  Exercise if you are able. Just 30 minutes of exercise each day can   help you feel better.  Take over-the-counter and prescription medicines only as told by your health care provider. Ask your mental health care provider about the possible side effects of any medicines you are taking.  Do not use alcohol or drugs, and remove these substances  from your home.  Remove weapons, poisons, knives, and other deadly items from your home. General recommendations  Keep your living space well lit.  When you are feeling well, write yourself a letter with tips and support that you can read when you are not feeling well.  Remember that life's difficulties can be sorted out with help. Conditions can be treated, and you can learn behaviors and ways of thinking that will help you. Where to find more information  National Suicide Prevention Lifeline: www.suicidepreventionlifeline.org  Hopeline: www.hopeline.com  American Foundation for Suicide Prevention: www.afsp.org  The Trevor Project (for lesbian, gay, bisexual, transgender, or questioning youth): www.thetrevorproject.org Contact a health care provider if:  You feel as though you are a burden to others.  You feel agitated, angry, vengeful, or have extreme mood swings.  You have withdrawn from family and friends. Get help right away if:  You are talking about suicide or wishing to die.  You start making plans for how to commit suicide.  You feel that you have no reason to live.  You start making plans for putting your affairs in order, saying goodbye, or giving your possessions away.  You feel guilt, shame, or unbearable pain, and it seems like there is no way out.  You are frequently using drugs or alcohol.  You are engaging in risky behaviors that could lead to death. If you have any of these symptoms, get help right away. Call emergency services, go to your nearest emergency department or crisis center, or call a suicide crisis helpline. Summary  Suicide is when you take your own life.  Promise yourself that you will not do anything extreme when you have suicidal feelings.  Let family, friends, teachers, or counselors know how you are feeling.  Get help right away if you feel as though life is getting too tough to handle and you are thinking about suicide. This  information is not intended to replace advice given to you by your health care provider. Make sure you discuss any questions you have with your health care provider. Document Revised: 01/10/2019 Document Reviewed: 05/02/2017 Elsevier Patient Education  2020 Elsevier Inc.  

## 2020-02-17 ENCOUNTER — Ambulatory Visit: Payer: Self-pay | Admitting: *Deleted

## 2020-02-17 NOTE — Telephone Encounter (Signed)
Patient calling with intermittent dizziness feeling woozy, no vertigo. Stated she had a different Dr. Appointment today and her pressure was 175/100 and 140/90 and no recorded HR. Denies CP/HA. Experienced visual flashes once last week none since. Does feel she can't get a good deep breath at times. For the last week she has felt her b/p was elevated and has occasionally taken and extra 1/2 tab of Hydrodiuril 25 mg tab. Continues to have bilat lower leg edema but is less than when she was in last on 02/06/20. Legs appear red to her.   Has good urine output. Reviewed Care Advice with the patient.  Due to the SOB, DT states a virtual visit is required. She had both Covid vaccines over a month ago and no other covid type symptoms. Because of DT I made her a virtual with Dr. Army Melia at 11:40am Tuesday.  Reason for Disposition . [1] MODERATE dizziness (e.g., interferes with normal activities) AND [2] has NOT been evaluated by physician for this  (Exception: dizziness caused by heat exposure, sudden standing, or poor fluid intake)  Answer Assessment - Initial Assessment Questions 1. DESCRIPTION: "Describe your dizziness."     Dizziness, woozy for the last few days 2. LIGHTHEADED: "Do you feel lightheaded?" (e.g., somewhat faint, woozy, weak upon standing)     woozy 3. VERTIGO: "Do you feel like either you or the room is spinning or tilting?" (i.e. vertigo)     no 4. SEVERITY: "How bad is it?"  "Do you feel like you are going to faint?" "Can you stand and walk?"   - MILD - walking normally   - MODERATE - interferes with normal activities (e.g., work, school)    - SEVERE - unable to stand, requires support to walk, feels like passing out now.      moderate 5. ONSET:  "When did the dizziness begin?"     Few days ago 6. AGGRAVATING FACTORS: "Does anything make it worse?" (e.g., standing, change in head position)     Nothing inparticular 7. HEART RATE: "Can you tell me your heart rate?" "How many beats in  15 seconds?"  (Note: not all patients can do this)       No 8. CAUSE: "What do you think is causing the dizziness?"     Blood pressure is high 9. RECURRENT SYMPTOM: "Have you had dizziness before?" If so, ask: "When was the last time?" "What happened that time?"     no 10. OTHER SYMPTOMS: "Do you have any other symptoms?" (e.g., fever, chest pain, vomiting, diarrhea, bleeding)       none 11. PREGNANCY: "Is there any chance you are pregnant?" "When was your last menstrual period?"       na  Protocols used: DIZZINESS [facility]

## 2020-02-18 ENCOUNTER — Other Ambulatory Visit: Payer: Self-pay

## 2020-02-18 ENCOUNTER — Telehealth: Payer: Self-pay | Admitting: Internal Medicine

## 2020-02-18 ENCOUNTER — Encounter: Payer: Self-pay | Admitting: Internal Medicine

## 2020-02-18 ENCOUNTER — Ambulatory Visit (INDEPENDENT_AMBULATORY_CARE_PROVIDER_SITE_OTHER): Payer: Medicare Other | Admitting: Internal Medicine

## 2020-02-18 ENCOUNTER — Other Ambulatory Visit: Payer: Self-pay | Admitting: Internal Medicine

## 2020-02-18 VITALS — BP 124/70 | HR 71 | Temp 98.1°F | Ht 62.0 in | Wt 156.0 lb

## 2020-02-18 DIAGNOSIS — I872 Venous insufficiency (chronic) (peripheral): Secondary | ICD-10-CM

## 2020-02-18 DIAGNOSIS — I10 Essential (primary) hypertension: Secondary | ICD-10-CM | POA: Diagnosis not present

## 2020-02-18 DIAGNOSIS — H9392 Unspecified disorder of left ear: Secondary | ICD-10-CM

## 2020-02-18 DIAGNOSIS — Z23 Encounter for immunization: Secondary | ICD-10-CM

## 2020-02-18 MED ORDER — SHINGRIX 50 MCG/0.5ML IM SUSR: 0.5000 mL | Freq: Once | INTRAMUSCULAR | 1 refills | Status: AC

## 2020-02-18 NOTE — Progress Notes (Signed)
Date:  02/18/2020   Name:  Lori Trevino   DOB:  08-18-1951   MRN:  [id number]   Chief Complaint: Dizziness (Thinks it may be her BP causing dizziness. Pt said she was dizzy yesterday- now she feels fine. She said she felt like she was gonna fall over when she walked. Ears red and hot- especially RT. ) and Edema (Swollen red legs- bilateral. Started swelling in March- was put on a fluid pill from [facility] then and it helped but still not all the way unswollen. )  Otalgia  There is pain in the right ear. This is a new problem. There has been no fever. Pertinent negatives include no coughing or headaches.  Hypertension This is a chronic problem. The problem is controlled. Pertinent negatives include no chest pain, headaches, palpitations or shortness of breath. Past treatments include diuretics. There are no compliance problems.     Lab Results  Component Value Date   CREATININE 0.58 12/18/2019   BUN 15 12/18/2019   NA 132 (L) 12/18/2019   K 3.9 12/18/2019   CL 95 (L) 12/18/2019   CO2 31 12/18/2019   Lab Results  Component Value Date   CHOL 244 (H) 10/27/2017   HDL 116 10/27/2017   LDLCALC 116 (H) 10/27/2017   TRIG 59 10/27/2017   CHOLHDL 2.1 10/27/2017   Lab Results  Component Value Date   TSH 0.567 10/27/2017   No results found for: HGBA1C Lab Results  Component Value Date   WBC 5.9 12/18/2019   HGB 13.0 12/18/2019   HCT 39.0 12/18/2019   MCV 92.9 12/18/2019   PLT 244 12/18/2019   Lab Results  Component Value Date   ALT 18 12/18/2019   AST 24 12/18/2019   ALKPHOS 52 12/18/2019   BILITOT 0.8 12/18/2019     Review of Systems  Constitutional: Negative for chills, fatigue, fever and unexpected weight change.  HENT: Positive for ear pain.   Respiratory: Negative for cough, chest tightness, shortness of breath and wheezing.   Cardiovascular: Positive for leg swelling. Negative for chest pain and palpitations.  Musculoskeletal: Positive for back pain.  Neurological:  Positive for dizziness. Negative for light-headedness and headaches.  Psychiatric/Behavioral: Positive for sleep disturbance.    Patient Active Problem List   Diagnosis Date Noted  . Sensorineural hearing loss (SNHL) of both ears 09/17/2019  . Essential hypertension 08/23/2019  . Narcotic addiction ([place]) 08/23/2019  . Psychophysiological insomnia 08/23/2019  . Tinnitus of both ears 08/23/2019  . Elevated glucose level 02/05/2018  . Gait instability 10/27/2017  . Environmental and seasonal allergies 02/14/2017  . Anxiety about health 12/10/2015  . Mixed hyperlipidemia 12/08/2015  . 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 Chronic pain 05/02/2015  . Osteoarthritis 06/16/2014  . Radiculopathy of lumbar region 06/16/2014  . 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[place]) 06/12/2014  . Chronic insomnia 06/12/2014  . Vitamin D deficiency 05/31/2014  . Neuralgia and neuritis 06/25/2013  . Arthritis, midfoot 06/18/2013    Allergies  Allergen Reactions  . Neurontin [Gabapentin] Swelling  . Zolpidem Other (See Comments)    sleepwalking     Past Surgical History:  Procedure Laterality Date  . AXILLARY SURGERY Right 2008  . CHOLECYSTECTOMY    . CYSTOCELE REPAIR N/A 02/08/2016   Procedure: ANTERIOR REPAIR (CYSTOCELE);  Surgeon: Boykin Nearing, MD;  Location: [facility] ORS;  Service: Gynecology;  Laterality: N/A;  . LAPAROSCOPIC GASTRIC SLEEVE RESECTION WITH HIATAL HERNIA REPAIR  2015  .  TONSILLECTOMY    . TUBAL LIGATION    . VAGINAL HYSTERECTOMY N/A 02/08/2016   Procedure: HYSTERECTOMY VAGINAL WITH BSO;  Surgeon: Boykin Nearing, MD;  Location: [facility] ORS;  Service: Gynecology;  Laterality: N/A;  . VARICOSE VEIN SURGERY      Social History   Tobacco Use  . Smoking status: Current Every Day Smoker    Packs/day: 0.10    Years: 60.00    Pack years: 6.00    Types: Cigarettes  . Smokeless tobacco: Never Used  . Tobacco comment: 2-3 daily    Substance Use Topics  . Alcohol use: Not Currently    Alcohol/week: 0.0 standard drinks  . Drug use: No     Medication list has been reviewed and updated.  Current Meds  Medication Sig  . B Complex-Biotin-FA (B-COMPLEX PO) Take 1 tablet by mouth daily.  . busPIRone (BUSPAR) 15 MG tablet Take 15 mg by mouth 3 (three) times daily.  . fluticasone (FLONASE) 50 MCG/ACT nasal spray Place 1 spray into both nostrils daily.  . hydrochlorothiazide (HYDRODIURIL) 25 MG tablet Take 1 tablet (25 mg total) by mouth daily.  Marland Kitchen loratadine (CLARITIN) 10 MG tablet Take 10 mg by mouth daily as needed for allergies.  . methadone (DOLOPHINE) 1 mg/ml oral solution Take by mouth daily. 100 mg  . Multiple Vitamin (ONE-A-DAY 55 PLUS PO) Take 1 tablet by mouth daily.  . ondansetron (ZOFRAN-ODT) 8 MG disintegrating tablet Take 1 tablet (8 mg total) by mouth every 8 (eight) hours as needed for nausea or vomiting.  . zinc gluconate 50 MG tablet Take 50 mg by mouth daily.    PHQ 2/9 Scores 02/18/2020 02/06/2020 11/18/2019 10/07/2019  PHQ - 2 Score 4 4 0 4  PHQ- 9 Score 10 9 7 17     BP Readings from Last 3 Encounters:  02/18/20 124/70  02/06/20 136/82  12/18/19 (!) 156/81    Physical Exam Vitals and nursing note reviewed.  Constitutional:      General: She is not in acute distress.    Appearance: She is well-developed.  HENT:     Head: Normocephalic and atraumatic.     Right Ear: Tympanic membrane, ear canal and external ear normal.     Left Ear: Tympanic membrane and ear canal normal.     Ears:     Comments: Left external ear warm and pink  Cardiovascular:     Rate and Rhythm: Normal rate and regular rhythm.     Pulses: Normal pulses.     Comments: Varicose veins R>L Pulmonary:     Effort: Pulmonary effort is normal. No respiratory distress.     Breath sounds: Normal breath sounds. No wheezing or rhonchi.  Musculoskeletal:        General: Normal range of motion.     Cervical back: Normal range of  motion.     Right lower leg: Edema (trace ankle ) present.     Left lower leg: Edema (trace ankle) present.  Lymphadenopathy:     Cervical: No cervical adenopathy.  Skin:    General: Skin is warm and dry.     Findings: No rash.  Neurological:     General: No focal deficit present.     Mental Status: She is alert and oriented to person, place, and time.  Psychiatric:        Mood and Affect: Mood normal.        Behavior: Behavior normal.     Wt Readings from Last 3 Encounters:  02/18/20 156 lb (70.8 kg)  02/06/20 154 lb (69.9 kg)  12/18/19 145 lb (65.8 kg)    BP 124/70   Pulse 71   Temp 98.1 F (36.7 C) (Oral)   Ht 5\' 2"  (1.575 m)   Wt 156 lb (70.8 kg)   SpO2 98%   BMI 28.53 kg/m   Assessment and Plan: 1. Essential hypertension Clinically stable exam with well controlled BP on hctz. Tolerating medications without side effects at this time. BP elevated at Psych office with wrist cuff - clearly incorrect Pt to continue current regimen and low sodium diet; benefits of regular exercise as able discussed.  2. Venous insufficiency of both lower extremities Pt reassured; continue salt restriction and elevation  3. Need for shingles vaccine - Zoster Vaccine Adjuvanted [facility]) injection; Inject 0.5 mLs into the muscle once for 1 dose.  Dispense: 0.5 mL; Refill: 1  4. Ear problem, left Warm red external ear - either due to flushing or possibly from resting all day on that side No evidence of infection or other worrisome findings   Partially dictated using Dragon software. Any errors are unintentional.  Halina Maidens, MD [facility]  02/18/2020

## 2020-02-18 NOTE — Telephone Encounter (Signed)
Requested medication (s) are due for refill today - BP medication discontinued 12/18/19  Requested medication (s) are on the active medication list -no  Future visit scheduled -yes  Last refill: 12/09/19  Notes to clinic: Patient is requesting BP medication that was discontinued 12/18/19- patient had appointment today- sent for review before refusing due to discontinuation   Requested Prescriptions  Pending Prescriptions Disp Refills   amLODipine (NORVASC) 5 MG tablet [Pharmacy Med Name: AMLODIPINE BESYLATE 5 MG TAB] 90 tablet 1    Sig: TAKE 1 TABLET (5 MG TOTAL) BY MOUTH DAILY. FOR HIGH BLOOD PRESSURE      Cardiovascular:  Calcium Channel Blockers Passed - 02/18/2020  2:08 PM      Passed - Last BP in normal range    BP Readings from Last 1 Encounters:  02/18/20 124/70          Passed - Valid encounter within last 6 months    Recent Outpatient Visits           Today Essential hypertension   [facility] Glean Hess, MD   1 week ago Environmental and seasonal allergies   [facility] Glean Hess, MD   4 months ago Sensorineural hearing loss (SNHL) of both ears   [facility] Glean Hess, MD   5 months ago Essential hypertension   [facility] Glean Hess, MD   5 months ago Tinnitus of both [facility] Glean Hess, MD                  Requested Prescriptions  Pending Prescriptions Disp Refills   amLODipine (NORVASC) 5 MG tablet [Pharmacy Med Name: AMLODIPINE BESYLATE 5 MG TAB] 90 tablet 1    Sig: TAKE 1 TABLET (5 MG TOTAL) BY MOUTH DAILY. FOR HIGH BLOOD PRESSURE      Cardiovascular:  Calcium Channel Blockers Passed - 02/18/2020  2:08 PM      Passed - Last BP in normal range    BP Readings from Last 1 Encounters:  02/18/20 124/70          Passed - Valid encounter within last 6 months    Recent Outpatient Visits           Today Essential hypertension   [facility]  Glean Hess, MD   1 week ago Environmental and seasonal allergies   [facility] Glean Hess, MD   4 months ago Sensorineural hearing loss (SNHL) of both ears   [facility] Glean Hess, MD   5 months ago Essential hypertension   [place], [place]   5 months ago Tinnitus of both ears   [facility] Glean Hess, MD

## 2020-02-26 DIAGNOSIS — R600 Localized edema: Secondary | ICD-10-CM | POA: Insufficient documentation

## 2020-03-04 ENCOUNTER — Other Ambulatory Visit: Payer: Self-pay | Admitting: Internal Medicine

## 2020-03-04 DIAGNOSIS — I10 Essential (primary) hypertension: Secondary | ICD-10-CM

## 2020-03-04 MED ORDER — HYDROCHLOROTHIAZIDE 25 MG PO TABS: 25.0000 mg | ORAL_TABLET | Freq: Every day | ORAL | 0 refills | Status: DC

## 2020-03-04 NOTE — Telephone Encounter (Signed)
Patient needs another fill on these because [place]. They were just filled 3 weeks ago so it to early to get additional refill. Please advise

## 2020-03-04 NOTE — Telephone Encounter (Signed)
Medication Refill - Medication: hydrochlorothiazide   Has the patient contacted their pharmacy? Yes.   Pt states that she left the rest of her pills in [place] and is requesting to have these refilled ASAP. Please advise.  (Agent: If no, request that the patient contact the pharmacy for the refill.) (Agent: If yes, when and what did the pharmacy advise?)  Preferred Pharmacy (with phone number or street name):  [id number] - [place], [place]  [place] [address]  Phone: [phone] Fax: [phone]  Not a 24 hour pharmacy; exact hours not known.     Agent: Please be advised that RX refills may take up to 3 business days. We ask that you follow-up with your pharmacy.

## 2020-03-11 ENCOUNTER — Other Ambulatory Visit: Payer: Self-pay | Admitting: Internal Medicine

## 2020-03-11 DIAGNOSIS — J3089 Other allergic rhinitis: Secondary | ICD-10-CM

## 2020-05-27 ENCOUNTER — Other Ambulatory Visit: Payer: Self-pay | Admitting: Radiology

## 2020-05-27 ENCOUNTER — Other Ambulatory Visit: Payer: Medicare Other

## 2020-05-27 DIAGNOSIS — Z20822 Contact with and (suspected) exposure to covid-19: Secondary | ICD-10-CM

## 2020-05-29 LAB — SARS-COV-2, NAA 2 DAY TAT

## 2020-05-29 LAB — NOVEL CORONAVIRUS, NAA: SARS-CoV-2, NAA: NOT DETECTED

## 2020-06-04 ENCOUNTER — Ambulatory Visit: Payer: Medicare Other | Admitting: Internal Medicine

## 2020-06-04 ENCOUNTER — Telehealth: Payer: Self-pay | Admitting: Internal Medicine

## 2020-06-04 NOTE — Telephone Encounter (Signed)
Noted and Dr B informed. Thanks.

## 2020-06-04 NOTE — Telephone Encounter (Signed)
Copied from [place] [id number]. Topic: Quick Communication - Appointment Cancellation >> Jun 04, 2020  7:08 AM Lennox Solders wrote: Patient called to cancel appointment scheduled for today with dr berglund. Pt car would  not start. Pt did not rsc

## 2020-06-27 ENCOUNTER — Encounter: Payer: Self-pay | Admitting: Emergency Medicine

## 2020-06-27 ENCOUNTER — Ambulatory Visit
Admission: EM | Admit: 2020-06-27 | Discharge: 2020-06-27 | Disposition: A | Discharge status: Home / Self Care | Payer: Medicare Other | Attending: Internal Medicine | Admitting: Internal Medicine

## 2020-06-27 ENCOUNTER — Other Ambulatory Visit: Payer: Self-pay

## 2020-06-27 DIAGNOSIS — L0291 Cutaneous abscess, unspecified: Secondary | ICD-10-CM

## 2020-06-27 MED ORDER — DOXYCYCLINE HYCLATE 100 MG PO CAPS: 100.0000 mg | ORAL_CAPSULE | Freq: Two times a day (BID) | ORAL | 0 refills | Status: DC

## 2020-06-27 NOTE — Discharge Instructions (Signed)
Use heat for 15 min at a time 3-5 times a day for 3-5 days on the abscess area Come back if this area gets larger and more painful

## 2020-06-27 NOTE — ED Triage Notes (Signed)
Patient states that she developed an abscess in her right armpit since yesterday.  Patient reports that it is red and tender.  Patient denies fevers.

## 2020-06-27 NOTE — ED Provider Notes (Signed)
[facility]    CSN: [id number] Arrival date & time: 06/27/20  0804      History   Chief Complaint Chief Complaint  Patient presents with  . Abscess    HPI Lori Trevino is a 69 y.o. female who presents with R axilla lump. She states she has had a bebe like lump in this same area for years, and it is exactly where this bump is, which is described are red and tender and woke up in the middle of the night with discomfort. She admits she was squeezing this lump area last night. And has worked in the yard all day with repetitive arm motion     Past Medical History:  Diagnosis Date  . Allergy   . Hypertension   . Radiculopathy   . Tendonitis 11/2019   right wrist and right arm    Patient Active Problem List   Diagnosis Date Noted  . Sensorineural hearing loss (SNHL) of both ears 09/17/2019  . Essential hypertension 08/23/2019  . Narcotic addiction ([place]) 08/23/2019  . Psychophysiological insomnia 08/23/2019  . Tinnitus of both ears 08/23/2019  . Elevated glucose level 02/05/2018  . Gait instability 10/27/2017  . Environmental and seasonal allergies 02/14/2017  . Anxiety about health 12/10/2015  . Mixed hyperlipidemia 12/08/2015  . 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 Chronic pain 05/02/2015  . Osteoarthritis 06/16/2014  . Radiculopathy of lumbar region 06/16/2014  . 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[place]) 06/12/2014  . Chronic insomnia 06/12/2014  . Vitamin D deficiency 05/31/2014  . Neuralgia and neuritis 06/25/2013  . Arthritis, midfoot 06/18/2013    Past Surgical History:  Procedure Laterality Date  . AXILLARY SURGERY Right 2008  . CHOLECYSTECTOMY    . CYSTOCELE REPAIR N/A 02/08/2016   Procedure: ANTERIOR REPAIR (CYSTOCELE);  Surgeon: Boykin Nearing, MD;  Location: [facility] ORS;  Service: Gynecology;  Laterality: N/A;  . LAPAROSCOPIC GASTRIC SLEEVE RESECTION WITH HIATAL HERNIA REPAIR  2015  . TONSILLECTOMY      . TUBAL LIGATION    . VAGINAL HYSTERECTOMY N/A 02/08/2016   Procedure: HYSTERECTOMY VAGINAL WITH BSO;  Surgeon: Boykin Nearing, MD;  Location: [facility] ORS;  Service: Gynecology;  Laterality: N/A;  . VARICOSE VEIN SURGERY      OB History   No obstetric history on file.      Home Medications    Prior to Admission medications   Medication Sig Start Date End Date Taking? Authorizing Provider  B Complex-Biotin-FA (B-COMPLEX PO) Take 1 tablet by mouth daily.   Yes [provider]  busPIRone (BUSPAR) 15 MG tablet Take 15 mg by mouth 3 (three) times daily. 02/17/20  Yes [provider]  fluticasone (FLONASE) 50 MCG/ACT nasal spray PLACE 1 SPRAY INTO BOTH NOSTRILS DAILY. 03/11/20  Yes Glean Hess, MD  hydrochlorothiazide (HYDRODIURIL) 25 MG tablet Take 1 tablet (25 mg total) by mouth daily. 03/04/20  Yes Glean Hess, MD  loratadine (CLARITIN) 10 MG tablet Take 10 mg by mouth daily as needed for allergies.   Yes [provider]  methadone (DOLOPHINE) 1 mg/ml oral solution Take by mouth daily. 100 mg   Yes [provider]  Multiple Vitamin (ONE-A-DAY 55 PLUS PO) Take 1 tablet by mouth daily.   Yes [provider]  doxycycline (VIBRAMYCIN) 100 MG capsule Take 1 capsule (100 mg total) by mouth 2 (two) times daily. 06/27/20   Rodriguez-Southworth, Sunday Spillers, PA-C  ondansetron (ZOFRAN-ODT) 8 MG disintegrating tablet Take  1 tablet (8 mg total) by mouth every 8 (eight) hours as needed for nausea or vomiting. 02/06/20   Glean Hess, MD  zinc gluconate 50 MG tablet Take 50 mg by mouth daily.    [provider]  amLODipine (NORVASC) 5 MG tablet TAKE 1 TABLET (5 MG TOTAL) BY MOUTH DAILY. FOR HIGH BLOOD PRESSURE 09/09/19 12/18/19  Glean Hess, MD    Family History Family History  Problem Relation Age of Onset  . Stroke Mother   . Cancer Father   . Breast cancer Neg Hx     Social History Social History   Tobacco Use  . Smoking  status: Current Every Day Smoker    Packs/day: 0.10    Years: 60.00    Pack years: 6.00    Types: Cigarettes  . Smokeless tobacco: Never Used  . Tobacco comment: 2-3 daily  Vaping Use  . Vaping Use: Never used  Substance Use Topics  . Alcohol use: Not Currently    Alcohol/week: 0.0 standard drinks  . Drug use: No     Allergies   Neurontin [gabapentin] and Zolpidem   Review of Systems Review of Systems  Constitutional: Negative for chills, diaphoresis and fever.  Musculoskeletal: Negative for myalgias.  Skin: Negative for rash and wound.       Red, tender lump on R axilla as noted  Hematological: Negative for adenopathy.     Physical Exam Triage Vital Signs ED Triage Vitals  Enc Vitals Group     BP 06/27/20 0823 (!) 170/84     Pulse Rate 06/27/20 0823 74     Resp 06/27/20 0823 14     Temp 06/27/20 0823 97.9 F (36.6 C)     Temp Source 06/27/20 0823 Oral     SpO2 06/27/20 0823 100 %     Weight 06/27/20 0820 160 lb (72.6 kg)     Height 06/27/20 0820 5\' 2"  (1.575 m)     Head Circumference --      Peak Flow --      Pain Score 06/27/20 0819 5     Pain Loc --      Pain Edu? --      Excl. in [place]? --    No data found.  Updated Vital Signs BP (!) 170/84 (BP Location: Left Arm)   Pulse 74   Temp 97.9 F (36.6 C) (Oral)   Resp 14   Ht 5\' 2"  (1.575 m)   Wt 160 lb (72.6 kg)   SpO2 100%   BMI 29.26 kg/m   Visual Acuity Right Eye Distance:   Left Eye Distance:   Bilateral Distance:    Right Eye Near:   Left Eye Near:    Bilateral Near:     Physical Exam Vitals and nursing note reviewed.  Constitutional:      General: She is not in acute distress.    Appearance: She is not toxic-appearing.  HENT:     Right Ear: External ear normal.     Left Ear: External ear normal.  Eyes:     General: No scleral icterus.    Conjunctiva/sclera: Conjunctivae normal.  Pulmonary:     Effort: Pulmonary effort is normal.  Musculoskeletal:        General: Normal range of  motion.     Cervical back: Neck supple.  Lymphadenopathy:     Upper Body:     Right upper body: No axillary adenopathy.  Skin:    General: Skin is warm  and dry.     Findings: Erythema present. No bruising, lesion or rash.     Comments: R AXILLA- has 2cm x1.5 cm oval raised erythematous abscess which is indurated and tender.   Neurological:     Mental Status: She is alert and oriented to person, place, and time.  Psychiatric:        Mood and Affect: Mood normal.        Behavior: Behavior normal.        Thought Content: Thought content normal.        Judgment: Judgment normal.    UC Treatments / Results  Labs (all labs ordered are listed, but only abnormal results are displayed) Labs Reviewed - No data to display  EKG   Radiology No results found.  Procedures Procedures (including critical care time)  Medications Ordered in UC Medications - No data to display  Initial Impression / Assessment and Plan / UC Course  I have reviewed the triage vital signs and the nursing notes. I believe she has a sebaceous cyst that got inflamed or infected. I placed her on Doxy as noted and advised to use heat on this area. See instructions.  Final Clinical Impressions(s) / UC Diagnoses   Final diagnoses:  Abscess     Discharge Instructions     Use heat for 15 min at a time 3-5 times a day for 3-5 days on the abscess area Come back if this area gets larger and more painful    ED Prescriptions    Medication Sig Dispense Auth. Provider   doxycycline (VIBRAMYCIN) 100 MG capsule Take 1 capsule (100 mg total) by mouth 2 (two) times daily. 20 capsule Rodriguez-Southworth, Sunday Spillers, PA-C     PDMP not reviewed this encounter.   Shelby Mattocks, [place] 06/27/20 [phone]

## 2020-07-22 ENCOUNTER — Other Ambulatory Visit: Payer: Self-pay | Admitting: Internal Medicine

## 2020-07-22 DIAGNOSIS — I10 Essential (primary) hypertension: Secondary | ICD-10-CM

## 2020-07-22 NOTE — Telephone Encounter (Signed)
Requested medication (s) are due for refill today: yes  Requested medication (s) are on the active medication list: yes  Last refill: 03/04/20  #90 0 refills  Future visit scheduled: No  Notes to clinic:  failed protocol Na low at 132 on 12/18/19    Requested Prescriptions  Pending Prescriptions Disp Refills   hydrochlorothiazide (HYDRODIURIL) 25 MG tablet [Pharmacy Med Name: HYDROCHLOROTHIAZIDE 25 MG TAB] 90 tablet 0    Sig: TAKE 1 TABLET BY MOUTH EVERY DAY      Cardiovascular: Diuretics - Thiazide Failed - 07/22/2020 11:42 AM      Failed - Na in normal range and within 360 days    Sodium  Date Value Ref Range Status  12/18/2019 132 (L) 135 - 145 mmol/L Final          Failed - Last BP in normal range    BP Readings from Last 1 Encounters:  06/27/20 (!) 170/84          Passed - Ca in normal range and within 360 days    Calcium  Date Value Ref Range Status  12/18/2019 9.1 8.9 - 10.3 mg/dL Final          Passed - Cr in normal range and within 360 days    Creatinine, Ser  Date Value Ref Range Status  12/18/2019 0.58 0.44 - 1.00 mg/dL Final          Passed - K in normal range and within 360 days    Potassium  Date Value Ref Range Status  12/18/2019 3.9 3.5 - 5.1 mmol/L Final          Passed - Valid encounter within last 6 months    Recent Outpatient Visits           5 months ago Essential hypertension   [facility] Glean Hess, MD   5 months ago Environmental and seasonal allergies   [facility] Glean Hess, MD   9 months ago Sensorineural hearing loss (SNHL) of both ears   [facility] Glean Hess, MD   10 months ago Essential hypertension   [facility] Glean Hess, MD   11 months ago Tinnitus of both ears   [facility] Glean Hess, MD

## 2020-07-30 ENCOUNTER — Other Ambulatory Visit: Payer: Self-pay | Admitting: Internal Medicine

## 2020-07-30 DIAGNOSIS — F5104 Psychophysiologic insomnia: Secondary | ICD-10-CM

## 2020-07-30 NOTE — Telephone Encounter (Signed)
Please Advise. Medication is not on med list.

## 2020-07-30 NOTE — Telephone Encounter (Signed)
Requested medication (s) are due for refill today: yes  Requested medication (s) are on the active medication list: no  Last refill:  ?  Future visit scheduled: yes  Notes to clinic:  not on active med list    Requested Prescriptions  Pending Prescriptions Disp Refills   hydrOXYzine (ATARAX/VISTARIL) 25 MG tablet [Pharmacy Med Name: HYDROXYZINE HCL 25 MG TABLET] 90 tablet 5    Sig: Take 3 tablets (75 mg total) by mouth at bedtime as needed.      Ear, Nose, and Throat:  Antihistamines Passed - 07/30/2020  8:19 AM      Passed - Valid encounter within last 12 months    Recent Outpatient Visits           5 months ago Essential hypertension   [facility] Glean Hess, MD   5 months ago Environmental and seasonal allergies   [facility] Glean Hess, MD   9 months ago Sensorineural hearing loss (SNHL) of both ears   [facility] Glean Hess, MD   10 months ago Essential hypertension   [facility] Glean Hess, MD   11 months ago Tinnitus of both ears   [facility] Glean Hess, MD

## 2020-08-04 ENCOUNTER — Telehealth: Payer: Self-pay

## 2020-08-04 NOTE — Telephone Encounter (Signed)
Received quick response and request has been approved.

## 2020-08-04 NOTE — Telephone Encounter (Signed)
Completed PA on [email or web address] for hydroxyzine. Lori Trevino received results from insurance in 1-3 days via [place].

## 2020-08-25 ENCOUNTER — Ambulatory Visit (INDEPENDENT_AMBULATORY_CARE_PROVIDER_SITE_OTHER): Payer: Medicare Other | Admitting: Internal Medicine

## 2020-08-25 ENCOUNTER — Other Ambulatory Visit: Payer: Self-pay

## 2020-08-25 ENCOUNTER — Encounter: Payer: Self-pay | Admitting: Internal Medicine

## 2020-08-25 VITALS — BP 148/88 | HR 72 | Ht 62.0 in | Wt 170.0 lb

## 2020-08-25 DIAGNOSIS — I10 Essential (primary) hypertension: Secondary | ICD-10-CM | POA: Diagnosis not present

## 2020-08-25 DIAGNOSIS — R600 Localized edema: Secondary | ICD-10-CM

## 2020-08-25 DIAGNOSIS — I872 Venous insufficiency (chronic) (peripheral): Secondary | ICD-10-CM | POA: Diagnosis not present

## 2020-08-25 MED ORDER — FUROSEMIDE 20 MG PO TABS: 20.0000 mg | ORAL_TABLET | Freq: Every day | ORAL | 0 refills | Status: DC | PRN

## 2020-08-25 MED ORDER — OLMESARTAN MEDOXOMIL 20 MG PO TABS: 20.0000 mg | ORAL_TABLET | Freq: Every day | ORAL | 1 refills | Status: DC

## 2020-08-25 MED ORDER — HYDROCHLOROTHIAZIDE 25 MG PO TABS: 25.0000 mg | ORAL_TABLET | Freq: Every day | ORAL | 1 refills | Status: DC

## 2020-08-25 NOTE — Progress Notes (Signed)
Date:  08/25/2020   Name:  Lori Trevino   DOB:  1951/03/04   MRN:  [id number]   Chief Complaint: Hypertension (Follow up. ) and Edema  Hypertension This is a chronic problem. The problem is uncontrolled. Pertinent negatives include no chest pain, headaches or shortness of breath. Past treatments include diuretics. The current treatment provides mild improvement.  Edema - comes and goes. Now more uncomfortable and limiting her ability to walk and control her weight.  She has compression stockings but does not wear them often.  She denies high salt diet.  Lab Results  Component Value Date   CREATININE 0.58 12/18/2019   BUN 15 12/18/2019   NA 132 (L) 12/18/2019   K 3.9 12/18/2019   CL 95 (L) 12/18/2019   CO2 31 12/18/2019   Lab Results  Component Value Date   CHOL 244 (H) 10/27/2017   HDL 116 10/27/2017   LDLCALC 116 (H) 10/27/2017   TRIG 59 10/27/2017   CHOLHDL 2.1 10/27/2017   Lab Results  Component Value Date   TSH 0.567 10/27/2017   No results found for: HGBA1C Lab Results  Component Value Date   WBC 5.9 12/18/2019   HGB 13.0 12/18/2019   HCT 39.0 12/18/2019   MCV 92.9 12/18/2019   PLT 244 12/18/2019   Lab Results  Component Value Date   ALT 18 12/18/2019   AST 24 12/18/2019   ALKPHOS 52 12/18/2019   BILITOT 0.8 12/18/2019     Review of Systems  Constitutional: Positive for unexpected weight change. Negative for chills, fatigue and fever.  HENT: Negative for trouble swallowing.   Respiratory: Negative for cough, chest tightness and shortness of breath.   Cardiovascular: Positive for leg swelling. Negative for chest pain.  Musculoskeletal: Positive for back pain and gait problem (due to leg swelling).  Neurological: Negative for dizziness and headaches.    Patient Active Problem List   Diagnosis Date Noted  . Pedal edema 02/26/2020  . Sensorineural hearing loss (SNHL) of both ears 09/17/2019  . Essential hypertension 08/23/2019  . Narcotic addiction  ([place]) 08/23/2019  . Psychophysiological insomnia 08/23/2019  . Tinnitus of both ears 08/23/2019  . Elevated glucose level 02/05/2018  . Gait instability 10/27/2017  . Environmental and seasonal allergies 02/14/2017  . Anxiety about health 12/10/2015  . Mixed hyperlipidemia 12/08/2015  . 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 Chronic pain 05/02/2015  . Osteoarthritis 06/16/2014  . Radiculopathy of lumbar region 06/16/2014  . 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[place]) 06/12/2014  . Chronic insomnia 06/12/2014  . Vitamin D deficiency 05/31/2014  . Neuralgia and neuritis 06/25/2013  . Arthritis, midfoot 06/18/2013    Allergies  Allergen Reactions  . Neurontin [Gabapentin] Swelling  . Zolpidem Other (See Comments)    sleepwalking     Past Surgical History:  Procedure Laterality Date  . AXILLARY SURGERY Right 2008  . CHOLECYSTECTOMY    . CYSTOCELE REPAIR N/A 02/08/2016   Procedure: ANTERIOR REPAIR (CYSTOCELE);  Surgeon: Boykin Nearing, MD;  Location: [facility] ORS;  Service: Gynecology;  Laterality: N/A;  . LAPAROSCOPIC GASTRIC SLEEVE RESECTION WITH HIATAL HERNIA REPAIR  2015  . TONSILLECTOMY    . TUBAL LIGATION    . VAGINAL HYSTERECTOMY N/A 02/08/2016   Procedure: HYSTERECTOMY VAGINAL WITH BSO;  Surgeon: Boykin Nearing, MD;  Location: [facility] ORS;  Service: Gynecology;  Laterality: N/A;  . VARICOSE VEIN SURGERY      Social History   Tobacco Use  .  Smoking status: Current Every Day Smoker    Packs/day: 0.10    Years: 60.00    Pack years: 6.00    Types: Cigarettes  . Smokeless tobacco: Never Used  . Tobacco comment: 2-3 daily  Vaping Use  . Vaping Use: Never used  Substance Use Topics  . Alcohol use: Not Currently    Alcohol/week: 0.0 standard drinks  . Drug use: No     Medication list has been reviewed and updated.  Current Meds  Medication Sig  . B Complex-Biotin-FA (B-COMPLEX PO) Take 1 tablet by mouth daily.  .  busPIRone (BUSPAR) 15 MG tablet Take 15 mg by mouth 3 (three) times daily.  . fluticasone (FLONASE) 50 MCG/ACT nasal spray PLACE 1 SPRAY INTO BOTH NOSTRILS DAILY.  . hydrochlorothiazide (HYDRODIURIL) 25 MG tablet TAKE 1 TABLET BY MOUTH EVERY DAY  . hydrOXYzine (ATARAX/VISTARIL) 25 MG tablet TAKE 3 TABLETS (75 MG TOTAL) BY MOUTH AT BEDTIME AS NEEDED.  Marland Kitchen loratadine (CLARITIN) 10 MG tablet Take 10 mg by mouth daily as needed for allergies.  . methadone (DOLOPHINE) 1 mg/ml oral solution Take by mouth daily. 100 mg  . Multiple Vitamin (ONE-A-DAY 55 PLUS PO) Take 1 tablet by mouth daily.  . ondansetron (ZOFRAN-ODT) 8 MG disintegrating tablet Take 1 tablet (8 mg total) by mouth every 8 (eight) hours as needed for nausea or vomiting.  . zinc gluconate 50 MG tablet Take 50 mg by mouth daily.    PHQ 2/9 Scores 08/25/2020 02/18/2020 02/06/2020 11/18/2019  PHQ - 2 Score 2 4 4  0  PHQ- 9 Score 8 10 9 7     GAD 7 : Generalized Anxiety Score 08/25/2020 02/18/2020 02/06/2020 09/17/2019  Nervous, Anxious, on Edge 1 2 1 3   Control/stop worrying 1 2 1 3   Worry too much - different things 1 2 1 3   Trouble relaxing 0 2 1 3   Restless 1 0 1 3  Easily annoyed or irritable 0 0 0 3  Afraid - awful might happen 0 0 0 3  Total GAD 7 Score 4 8 5 21   Anxiety Difficulty Not difficult at all Not difficult at all Not difficult at all Very difficult    BP Readings from Last 3 Encounters:  08/25/20 (!) 162/84  06/27/20 (!) 170/84  02/18/20 124/70    Physical Exam Vitals and nursing note reviewed.  Constitutional:      General: She is not in acute distress.    Appearance: She is well-developed.  HENT:     Head: Normocephalic and atraumatic.  Cardiovascular:     Rate and Rhythm: Normal rate and regular rhythm.     Pulses: Normal pulses.  Pulmonary:     Effort: Pulmonary effort is normal. No respiratory distress.     Breath sounds: No wheezing or rhonchi.  Musculoskeletal:        General: Tenderness present.      Cervical back: Normal range of motion.     Right lower leg: Edema present.     Left lower leg: Edema present.  Lymphadenopathy:     Cervical: No cervical adenopathy.  Skin:    General: Skin is warm and dry.     Findings: No rash.  Neurological:     General: No focal deficit present.     Mental Status: She is alert and oriented to person, place, and time.  Psychiatric:        Mood and Affect: Mood normal.     Wt Readings from Last 3 Encounters:  08/25/20 170 lb (77.1 kg)  06/27/20 160 lb (72.6 kg)  02/18/20 156 lb (70.8 kg)    BP (!) 162/84   Pulse 72   Ht 5\' 2"  (1.575 m)   Wt 170 lb (77.1 kg)   SpO2 98%   BMI 31.09 kg/m   Assessment and Plan: 1. Essential hypertension BP not controlled on hctz alone; will add Benicar Recheck in 2 months - olmesartan (BENICAR) 20 MG tablet; Take 1 tablet (20 mg total) by mouth daily.  Dispense: 90 tablet; Refill: 1 - hydrochlorothiazide (HYDRODIURIL) 25 MG tablet; Take 1 tablet (25 mg total) by mouth daily.  Dispense: 90 tablet; Refill: 1  2. Venous insufficiency of both lower extremities With edema  Pt encouraged to wear compression stockings and begin regular walking program She is instructed not to wear the stockings at night  3. Localized edema A few lasix are given to use PRN for severe discomfort from swelling Continue elevation, compression stockings during the day  - furosemide (LASIX) 20 MG tablet; Take 1 tablet (20 mg total) by mouth daily as needed.  Dispense: 10 tablet; Refill: 0   Partially dictated using Editor, commissioning. Any errors are unintentional.  Halina Maidens, MD [facility]  08/25/2020

## 2020-09-02 ENCOUNTER — Telehealth: Payer: Self-pay

## 2020-09-02 NOTE — Telephone Encounter (Signed)
Copied from [place] [id number]. Topic: General - Call Back - No Documentation >> Sep 02, 2020  3:18 PM Lori Trevino wrote: Reason for CRM: Pt has questions regarding her medications, she wants to know if she should be taking olmesartan along with everything else. Please advise   Best contact: [phone]

## 2020-09-02 NOTE — Telephone Encounter (Signed)
Called patient and said she started HCTZ and olmesartan and the olmesartan made her sleepy. Told her needs to give her body more time to adjust to the medication and we need to get her BP down. She verbalized understanding and said she will give the medication more time.

## 2020-09-08 ENCOUNTER — Telehealth: Payer: Self-pay | Admitting: Internal Medicine

## 2020-09-08 NOTE — Telephone Encounter (Signed)
Clled pt left VM to call back. Please read pt message from Dr. Army Melia.  Will route result note to [facility] Nurse Triage for follow up when patient returns call to clinic. Nurse may give results to patient if they return call. CRM created for this message.   KP

## 2020-09-08 NOTE — Telephone Encounter (Unsigned)
Copied from [place] [id number]. Topic: General - Other >> Sep 08, 2020  9:14 AM Celene Kras wrote: Reason for CRM: Pt called stating that she is needing to speak with nurse. She states that the new BP medication she is on, olmesartan, stating that it is causing her to have ringing in her ears. She is requesting to stay on Hydrochlorothiazide at a higher dosage. Please advise.     [id number] [address], [place] [address] Phone: [phone] Fax: [phone] Hours: Not open 24 hours

## 2020-09-08 NOTE — Telephone Encounter (Signed)
Patient advised of recommendations per notes by Dr. Army Melia on 09/08/20. Patient verbalized understanding.

## 2020-09-08 NOTE — Telephone Encounter (Signed)
Please review pts message.  KP

## 2020-09-08 NOTE — Telephone Encounter (Signed)
I am fairly certain that a higher dose of HCTZ is not going to get her blood pressure down significantly.  She should break the olmesartan in half and take that daily.

## 2020-09-21 ENCOUNTER — Other Ambulatory Visit: Payer: Self-pay | Admitting: Internal Medicine

## 2020-09-21 DIAGNOSIS — F5104 Psychophysiologic insomnia: Secondary | ICD-10-CM

## 2020-10-02 ENCOUNTER — Emergency Department
Admission: EM | Admit: 2020-10-02 | Discharge: 2020-10-02 | Disposition: A | Discharge status: Home / Self Care | Payer: Medicare Other | Attending: Emergency Medicine | Admitting: Emergency Medicine

## 2020-10-02 ENCOUNTER — Other Ambulatory Visit: Payer: Self-pay

## 2020-10-02 DIAGNOSIS — R531 Weakness: Secondary | ICD-10-CM | POA: Diagnosis present

## 2020-10-02 DIAGNOSIS — E871 Hypo-osmolality and hyponatremia: Secondary | ICD-10-CM | POA: Diagnosis not present

## 2020-10-02 DIAGNOSIS — F419 Anxiety disorder, unspecified: Secondary | ICD-10-CM | POA: Diagnosis not present

## 2020-10-02 DIAGNOSIS — I10 Essential (primary) hypertension: Secondary | ICD-10-CM | POA: Diagnosis not present

## 2020-10-02 DIAGNOSIS — F17210 Nicotine dependence, cigarettes, uncomplicated: Secondary | ICD-10-CM | POA: Insufficient documentation

## 2020-10-02 DIAGNOSIS — Z79899 Other long term (current) drug therapy: Secondary | ICD-10-CM | POA: Insufficient documentation

## 2020-10-02 DIAGNOSIS — R55 Syncope and collapse: Secondary | ICD-10-CM | POA: Insufficient documentation

## 2020-10-02 DIAGNOSIS — R197 Diarrhea, unspecified: Secondary | ICD-10-CM | POA: Insufficient documentation

## 2020-10-02 DIAGNOSIS — Z20822 Contact with and (suspected) exposure to covid-19: Secondary | ICD-10-CM | POA: Diagnosis not present

## 2020-10-02 LAB — CBC
HCT: 33.1 % — ABNORMAL LOW (ref 36.0–46.0)
Hemoglobin: 11.4 g/dL — ABNORMAL LOW (ref 12.0–15.0)
MCH: 30.2 pg (ref 26.0–34.0)
MCHC: 34.4 g/dL (ref 30.0–36.0)
MCV: 87.8 fL (ref 80.0–100.0)
Platelets: 211 10*3/uL (ref 150–400)
RBC: 3.77 MIL/uL — ABNORMAL LOW (ref 3.87–5.11)
RDW: 13 % (ref 11.5–15.5)
WBC: 7.3 10*3/uL (ref 4.0–10.5)
nRBC: 0 % (ref 0.0–0.2)

## 2020-10-02 LAB — URINALYSIS, COMPLETE (UACMP) WITH MICROSCOPIC
Bilirubin Urine: NEGATIVE
Glucose, UA: NEGATIVE mg/dL
Hgb urine dipstick: NEGATIVE
Ketones, ur: NEGATIVE mg/dL
Nitrite: NEGATIVE
Protein, ur: NEGATIVE mg/dL
Specific Gravity, Urine: 1.005 (ref 1.005–1.030)
pH: 7 (ref 5.0–8.0)

## 2020-10-02 LAB — URINE DRUG SCREEN, QUALITATIVE (ARMC ONLY)
Amphetamines, Ur Screen: NOT DETECTED
Barbiturates, Ur Screen: NOT DETECTED
Benzodiazepine, Ur Scrn: NOT DETECTED
Cannabinoid 50 Ng, Ur ~~LOC~~: NOT DETECTED
Cocaine Metabolite,Ur ~~LOC~~: NOT DETECTED
MDMA (Ecstasy)Ur Screen: NOT DETECTED
Methadone Scn, Ur: POSITIVE — AB
Opiate, Ur Screen: NOT DETECTED
Phencyclidine (PCP) Ur S: NOT DETECTED
Tricyclic, Ur Screen: POSITIVE — AB

## 2020-10-02 LAB — BASIC METABOLIC PANEL
Anion gap: 10 (ref 5–15)
Anion gap: 9 (ref 5–15)
BUN: 14 mg/dL (ref 8–23)
BUN: 10 mg/dL (ref 8–23)
CO2: 26 mmol/L (ref 22–32)
CO2: 24 mmol/L (ref 22–32)
Calcium: 9.1 mg/dL (ref 8.9–10.3)
Calcium: 8.7 mg/dL — ABNORMAL LOW (ref 8.9–10.3)
Chloride: 87 mmol/L — ABNORMAL LOW (ref 98–111)
Chloride: 96 mmol/L — ABNORMAL LOW (ref 98–111)
Creatinine, Ser: 0.4 mg/dL — ABNORMAL LOW (ref 0.44–1.00)
Creatinine, Ser: 0.48 mg/dL (ref 0.44–1.00)
GFR, Estimated: >60 mL/min (ref >60)
GFR, Estimated: >60 mL/min (ref >60)
Glucose, Bld: 105 mg/dL — ABNORMAL HIGH (ref 70–99)
Glucose, Bld: 96 mg/dL (ref 70–99)
Potassium: 3.9 mmol/L (ref 3.5–5.1)
Potassium: 3.7 mmol/L (ref 3.5–5.1)
Sodium: 123 mmol/L — ABNORMAL LOW (ref 135–145)
Sodium: 129 mmol/L — ABNORMAL LOW (ref 135–145)

## 2020-10-02 LAB — TSH: TSH: 0.671 u[IU]/mL (ref 0.350–4.500)

## 2020-10-02 LAB — TROPONIN I (HIGH SENSITIVITY)
Troponin I (High Sensitivity): 3 ng/L (ref <18)
Troponin I (High Sensitivity): 4 ng/L (ref <18)

## 2020-10-02 LAB — OSMOLALITY: Osmolality: 262 mOsm/kg — ABNORMAL LOW (ref 275–295)

## 2020-10-02 LAB — OSMOLALITY, URINE: Osmolality, Ur: 260 mOsm/kg — ABNORMAL LOW (ref 300–900)

## 2020-10-02 LAB — PHOSPHORUS: Phosphorus: 2.7 mg/dL (ref 2.5–4.6)

## 2020-10-02 LAB — MAGNESIUM: Magnesium: 2.1 mg/dL (ref 1.7–2.4)

## 2020-10-02 LAB — SODIUM, URINE, RANDOM: Sodium, Ur: 68 mmol/L

## 2020-10-02 MED ORDER — METHADONE HCL 10 MG/ML PO CONC
20.0000 mg | Freq: Once | ORAL | Status: AC
  Administered 2020-10-02: 20 mg via ORAL
  Filled 2020-10-02: qty 2

## 2020-10-02 MED ORDER — SODIUM CHLORIDE 0.9 % IV BOLUS
1000.0000 mL | Freq: Once | INTRAVENOUS | Status: AC
  Administered 2020-10-02: 1000 mL via INTRAVENOUS

## 2020-10-02 NOTE — ED Triage Notes (Signed)
Pt presents via EMS c/o weakness starting this am. Reports has been on Methadone x1 year for chronic narcotic usage. Pt report had diarrhea this am with episode of extreme weakness. Pt unable to go to Methadone clinic this am per pt report. Reports nausea.

## 2020-10-02 NOTE — ED Provider Notes (Signed)
[facility] Emergency Department Provider Note ____________________________________________   Event Date/Time   First MD Initiated Contact with Patient 10/02/20 1016     (approximate)  I have reviewed the triage vital signs and the nursing notes.   HISTORY  Chief Complaint Weakness    HPI Lori Trevino is a 69 y.o. female with PMH as noted below who presents with generalized weakness and near syncope, acute onset this morning, and associated with diarrhea.  The patient states that she got up and had multiple episodes of watery diarrhea this morning.  She states that she sat on the toilet for several hours because every time she tried to get up, she felt like she was about to pass out and her legs felt very weak.  The patient reports some nausea but denies vomiting.  She has no chest or abdominal pain.  She denies any shortness of breath.  She states that yesterday she was feeling fine.  The patient also reports that she is on 100 mg of methadone daily, and has not had her dose today due to the acute symptoms.  Past Medical History:  Diagnosis Date  . Allergy   . Hypertension   . Radiculopathy   . Tendonitis 11/2019   right wrist and right arm    Patient Active Problem List   Diagnosis Date Noted  . Pedal edema 02/26/2020  . Sensorineural hearing loss (SNHL) of both ears 09/17/2019  . Essential hypertension 08/23/2019  . Narcotic addiction ([place]) 08/23/2019  . Psychophysiological insomnia 08/23/2019  . Tinnitus of both ears 08/23/2019  . Elevated glucose level 02/05/2018  . Gait instability 10/27/2017  . Environmental and seasonal allergies 02/14/2017  . Anxiety about health 12/10/2015  . Mixed hyperlipidemia 12/08/2015  . 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 Chronic pain 05/02/2015  . Osteoarthritis 06/16/2014  . Radiculopathy of lumbar region 06/16/2014  . 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 Chronic insomnia 06/12/2014  . Vitamin D deficiency 05/31/2014  . Neuralgia and neuritis 06/25/2013  . Arthritis, midfoot 06/18/2013    Past Surgical History:  Procedure Laterality Date  . AXILLARY SURGERY Right 2008  . CHOLECYSTECTOMY    . CYSTOCELE REPAIR N/A 02/08/2016   Procedure: ANTERIOR REPAIR (CYSTOCELE);  Surgeon: Boykin Nearing, MD;  Location: [facility] ORS;  Service: Gynecology;  Laterality: N/A;  . LAPAROSCOPIC GASTRIC SLEEVE RESECTION WITH HIATAL HERNIA REPAIR  2015  . TONSILLECTOMY    . TUBAL LIGATION    . VAGINAL HYSTERECTOMY N/A 02/08/2016   Procedure: HYSTERECTOMY VAGINAL WITH BSO;  Surgeon: Boykin Nearing, MD;  Location: [facility] ORS;  Service: Gynecology;  Laterality: N/A;  . VARICOSE VEIN SURGERY      Prior to Admission medications   Medication Sig Start Date End Date Taking? Authorizing Provider  B Complex-Biotin-FA (B-COMPLEX PO) Take 1 tablet by mouth daily.    [provider]  busPIRone (BUSPAR) 15 MG tablet Take 15 mg by mouth 3 (three) times daily. 02/17/20   [provider]  fluticasone (FLONASE) 50 MCG/ACT nasal spray PLACE 1 SPRAY INTO BOTH NOSTRILS DAILY. 03/11/20   Glean Hess, MD  furosemide (LASIX) 20 MG tablet Take 1 tablet (20 mg total) by mouth daily as needed. 08/25/20   Glean Hess, MD  hydrochlorothiazide (HYDRODIURIL) 25 MG tablet Take 1 tablet (25 mg total) by mouth daily. 08/25/20   Glean Hess, MD  hydrOXYzine (ATARAX/VISTARIL) 25 MG tablet TAKE 3 TABLETS (  75 MG TOTAL) BY MOUTH AT BEDTIME AS NEEDED. 09/21/20 10/21/20  Glean Hess, MD  loratadine (CLARITIN) 10 MG tablet Take 10 mg by mouth daily as needed for allergies.    [provider]  methadone (DOLOPHINE) 1 mg/ml oral solution Take by mouth daily. 100 mg    [provider]  Multiple Vitamin (ONE-A-DAY 55 PLUS PO) Take 1 tablet by mouth daily.    [provider]  olmesartan (BENICAR) 20 MG tablet Take 1 tablet (20 mg  total) by mouth daily. 08/25/20   Glean Hess, MD  ondansetron (ZOFRAN-ODT) 8 MG disintegrating tablet Take 1 tablet (8 mg total) by mouth every 8 (eight) hours as needed for nausea or vomiting. 02/06/20   Glean Hess, MD  zinc gluconate 50 MG tablet Take 50 mg by mouth daily.    [provider]  amLODipine (NORVASC) 5 MG tablet TAKE 1 TABLET (5 MG TOTAL) BY MOUTH DAILY. FOR HIGH BLOOD PRESSURE 09/09/19 12/18/19  Glean Hess, MD    Allergies Neurontin [gabapentin] and Zolpidem  Family History  Problem Relation Age of Onset  . Stroke Mother   . Cancer Father   . Breast cancer Neg Hx     Social History Social History   Tobacco Use  . Smoking status: Current Every Day Smoker    Packs/day: 0.10    Years: 60.00    Pack years: 6.00    Types: Cigarettes  . Smokeless tobacco: Never Used  . Tobacco comment: 2-3 daily  Vaping Use  . Vaping Use: Never used  Substance Use Topics  . Alcohol use: Not Currently    Alcohol/week: 0.0 standard drinks  . Drug use: No    Review of Systems  Constitutional: No fever.  Positive for weakness. Eyes: No visual changes. ENT: No sore throat. Cardiovascular: Denies chest pain. Respiratory: Denies shortness of breath. Gastrointestinal: Positive for nausea and diarrhea. Genitourinary: Negative for dysuria.  Musculoskeletal: Negative for back pain. Skin: Negative for rash. Neurological: Negative for headaches, focal weakness or numbness.   ____________________________________________   PHYSICAL EXAM:  VITAL SIGNS: ED Triage Vitals [10/02/20 0830]  Enc Vitals Group     BP (!) 143/100     Pulse Rate 91     Resp 14     Temp 97.8 F (36.6 C)     Temp Source Oral     SpO2 100 %     Weight      Height      Head Circumference      Peak Flow      Pain Score 0     Pain Loc      Pain Edu?      Excl. in [place]?     Constitutional: Alert and oriented.  Anxious appearing but in no acute distress. Eyes: Conjunctivae  are normal.  Head: Atraumatic. Nose: No congestion/rhinnorhea. Mouth/Throat: Mucous membranes are dry.   Neck: Normal range of motion.  Cardiovascular: Normal rate, regular rhythm. Grossly normal heart sounds.  Good peripheral circulation. Respiratory: Normal respiratory effort.  No retractions. Lungs CTAB. Gastrointestinal: Soft and nontender. No distention.  Genitourinary: No flank tenderness. Musculoskeletal: No lower extremity edema.  Extremities warm and well perfused.  Neurologic:  Normal speech and language.  Motor and sensory intact in all extremities.  Normal coordination.  No gross focal neurologic deficits are appreciated.  Skin:  Skin is warm and dry. No rash noted. Psychiatric: Slightly anxious; mood and affect otherwise normal.  Speech and behavior  are normal.  ____________________________________________   LABS (all labs ordered are listed, but only abnormal results are displayed)  Labs Reviewed  BASIC METABOLIC PANEL - Abnormal; Notable for the following components:      Result Value   Sodium 123 (*)    Chloride 87 (*)    Glucose, Bld 105 (*)    Creatinine, Ser 0.40 (*)    All other components within normal limits  CBC - Abnormal; Notable for the following components:   RBC 3.77 (*)    Hemoglobin 11.4 (*)    HCT 33.1 (*)    All other components within normal limits  URINALYSIS, COMPLETE (UACMP) WITH MICROSCOPIC - Abnormal; Notable for the following components:   Color, Urine YELLOW (*)    APPearance CLEAR (*)    Leukocytes,Ua TRACE (*)    Bacteria, UA RARE (*)    All other components within normal limits  URINE DRUG SCREEN, QUALITATIVE ([facility] ONLY) - Abnormal; Notable for the following components:   Tricyclic, Ur Screen POSITIVE (*)    Methadone Scn, Ur POSITIVE (*)    All other components within normal limits  OSMOLALITY, URINE - Abnormal; Notable for the following components:   Osmolality, Ur 260 (*)    All other components within normal limits   OSMOLALITY - Abnormal; Notable for the following components:   Osmolality 262 (*)    All other components within normal limits  BASIC METABOLIC PANEL - Abnormal; Notable for the following components:   Sodium 129 (*)    Chloride 96 (*)    Calcium 8.7 (*)    All other components within normal limits  SARS CORONAVIRUS 2 (TAT 6-24 HRS)  SODIUM, URINE, RANDOM  MAGNESIUM  PHOSPHORUS  TSH  TROPONIN I (HIGH SENSITIVITY)  TROPONIN I (HIGH SENSITIVITY)   ____________________________________________  EKG  ED ECG REPORT I, Dionne Bucy, the attending physician, personally viewed and interpreted this ECG.  Date: 10/02/2020 EKG Time: 0836 Rate: 84 Rhythm: normal sinus rhythm QRS Axis: normal Intervals: normal ST/T Wave abnormalities: normal Narrative Interpretation: no evidence of acute ischemia  ____________________________________________  RADIOLOGY    ____________________________________________   PROCEDURES  Procedure(s) performed: No  Procedures  Critical Care performed: No ____________________________________________   INITIAL IMPRESSION / ASSESSMENT AND PLAN / ED COURSE  Pertinent labs & imaging results that were available during my care of the patient were reviewed by me and considered in my medical decision making (see chart for details).  69 year old female with PMH as noted above including hypertension and former opiate abuse (currently on methadone) presents with generalized weakness and near syncope which began acutely this morning after the patient had several episodes of diarrhea.  I reviewed the past medical records in epic.  The patient was most recently seen in urgent care in September with an axillary lump.  She has no other recent ED visits or admissions.  She follows with Dr. Judithann Graves and was last seen on 11/23.  She takes telmisartan and hydrochlorothiazide for hypertension.  On exam the patient is overall well-appearing.  Her vital signs  are normal.  The physical exam is unremarkable.  The patient appears somewhat generally weak but has no focal neurologic deficits.  Initial lab work-up obtained from triage reveals hyponatremia which is new.  Differential includes weakness and near syncope due to hyponatremia, dehydration, other metabolic or endocrine disturbance, side effects of methadone (although I doubt this given that the patient has been on the same dose for a while), or less likely cardiac cause.  We will give IV fluids, obtain additional lab work-up, and reassess.  ----------------------------------------- 3:08 PM on 10/02/2020 -----------------------------------------  The patient received 2 L of IV fluids.  The additional work-up was reassuring, with only slightly low urine and serum osmolality.  On the repeat BMP, the patient's sodium is now 129 which is a significant improvement.  The patient states that she feels much better and is able to ambulate without difficulty.  She is tolerating p.o.  Her diarrhea has resolved.  At this time, the patient is stable for discharge home.  I counseled her on the results of the work-up.  I advised her to follow-up with her primary care doctor in 1 to 2 weeks.  I gave her thorough return precautions and she expressed understanding.  ____________________________________________   FINAL CLINICAL IMPRESSION(S) / ED DIAGNOSES  Final diagnoses:  Hyponatremia      NEW MEDICATIONS STARTED DURING THIS VISIT:  New Prescriptions   No medications on file     Note:  This document was prepared using Dragon voice recognition software and may include unintentional dictation errors.    Arta Silence, MD 10/02/20 [phone]

## 2020-10-02 NOTE — Discharge Instructions (Addendum)
Make sure you are drinking plenty of fluids and eating regularly throughout the day.  Continue taking your normal medications as prescribed.  Follow-up with your primary care doctor within the next 1 to 2 weeks to recheck your sodium level.  Return to the ER for new, worsening, or persistent severe generalized weakness, lightheadedness, muscle spasms or cramps, seizures, or any other new or worsening symptoms that concern you.

## 2020-10-02 NOTE — ED Triage Notes (Signed)
FIRST NURSE NOTE:  Pt arrived via [facility] N/V/D since 5am, anxiety as well. Due to go to methadone clinic this AM but unable to go.   167/95 p-90 98% RA, 97.4 temp CBG 126

## 2020-10-02 NOTE — ED Notes (Signed)
Pt on phone with friend Leonette Most who she instructed to call her son to let him know she was here.

## 2020-10-02 NOTE — ED Notes (Signed)
ED Provider at bedside. 

## 2020-10-03 LAB — SARS CORONAVIRUS 2 (TAT 6-24 HRS): SARS Coronavirus 2: NEGATIVE

## 2020-10-04 ENCOUNTER — Telehealth: Payer: Self-pay

## 2020-10-04 NOTE — Telephone Encounter (Signed)

## 2020-10-18 ENCOUNTER — Other Ambulatory Visit: Payer: Self-pay | Admitting: Internal Medicine

## 2020-10-18 DIAGNOSIS — F5104 Psychophysiologic insomnia: Secondary | ICD-10-CM

## 2020-10-30 ENCOUNTER — Other Ambulatory Visit: Payer: Self-pay | Admitting: Internal Medicine

## 2020-10-30 DIAGNOSIS — J3089 Other allergic rhinitis: Secondary | ICD-10-CM

## 2020-11-13 ENCOUNTER — Other Ambulatory Visit: Payer: Self-pay | Admitting: Internal Medicine

## 2020-11-13 DIAGNOSIS — F5104 Psychophysiologic insomnia: Secondary | ICD-10-CM

## 2020-11-13 NOTE — Telephone Encounter (Signed)
Requested medications are due for refill today yes  Requested medications are on the active medication list yes  Last refill 1/16  Future visit scheduled 2/16  Notes to clinic OV note states take until 2/15, has appt 2/16.

## 2020-11-18 ENCOUNTER — Ambulatory Visit: Payer: Self-pay

## 2020-11-26 ENCOUNTER — Other Ambulatory Visit: Payer: Self-pay | Admitting: Internal Medicine

## 2020-11-26 DIAGNOSIS — F5104 Psychophysiologic insomnia: Secondary | ICD-10-CM

## 2020-12-03 ENCOUNTER — Ambulatory Visit: Payer: Medicare Other | Admitting: Internal Medicine

## 2020-12-17 ENCOUNTER — Other Ambulatory Visit: Payer: Self-pay

## 2020-12-17 ENCOUNTER — Ambulatory Visit (INDEPENDENT_AMBULATORY_CARE_PROVIDER_SITE_OTHER): Payer: Medicare Other | Admitting: Internal Medicine

## 2020-12-17 ENCOUNTER — Encounter: Payer: Self-pay | Admitting: Internal Medicine

## 2020-12-17 VITALS — BP 126/78 | HR 78 | Temp 98.1°F | Ht 62.0 in | Wt 173.0 lb

## 2020-12-17 DIAGNOSIS — I7300 Raynaud's syndrome without gangrene: Secondary | ICD-10-CM | POA: Diagnosis not present

## 2020-12-17 DIAGNOSIS — I739 Peripheral vascular disease, unspecified: Secondary | ICD-10-CM | POA: Insufficient documentation

## 2020-12-17 DIAGNOSIS — H9313 Tinnitus, bilateral: Secondary | ICD-10-CM | POA: Diagnosis not present

## 2020-12-17 NOTE — Progress Notes (Signed)
Date:  12/17/2020   Name:  Lori Trevino   DOB:  30-Mar-1951   MRN:  [id number]   Chief Complaint: Hand Pain (X 1 month, When fingers get cold they hurt and turns white, feet turns blue when cold, legs feel like they are burning the switch to feeling like they are freezing) and Ear Pain (X2 weeks, every day, Ringing in ear, feels like water is on ear no drainage )  HPI Fingers turn white and painful with cold.  In between the color of the hands and fingers is ruddy. She has pain in the feet and legs, worse at night,  Turn red and feel hot.  Worse if she hangs her legs down or falls asleep sitting up. Ears feel like they have water.  Tinnitus is constant.  No ear pain or drainage.  Lab Results  Component Value Date   CREATININE 0.48 10/02/2020   BUN 10 10/02/2020   NA 129 (L) 10/02/2020   K 3.7 10/02/2020   CL 96 (L) 10/02/2020   CO2 24 10/02/2020   Lab Results  Component Value Date   CHOL 244 (H) 10/27/2017   HDL 116 10/27/2017   LDLCALC 116 (H) 10/27/2017   TRIG 59 10/27/2017   CHOLHDL 2.1 10/27/2017   Lab Results  Component Value Date   TSH 0.671 10/02/2020   No results found for: HGBA1C Lab Results  Component Value Date   WBC 7.3 10/02/2020   HGB 11.4 (L) 10/02/2020   HCT 33.1 (L) 10/02/2020   MCV 87.8 10/02/2020   PLT 211 10/02/2020   Lab Results  Component Value Date   ALT 18 12/18/2019   AST 24 12/18/2019   ALKPHOS 52 12/18/2019   BILITOT 0.8 12/18/2019     Review of Systems  Constitutional: Negative for chills, fatigue and fever.  HENT: Positive for tinnitus. Negative for postnasal drip, rhinorrhea and sinus pressure.   Respiratory: Negative for chest tightness and shortness of breath.   Cardiovascular: Positive for leg swelling. Negative for chest pain and palpitations.  Skin: Positive for color change.  Neurological: Negative for dizziness and headaches.    Patient Active Problem List   Diagnosis Date Noted  . Pedal edema 02/26/2020  .  Sensorineural hearing loss (SNHL) of both ears 09/17/2019  . Essential hypertension 08/23/2019  . Narcotic addiction ([place]) 08/23/2019  . Psychophysiological insomnia 08/23/2019  . Tinnitus of both ears 08/23/2019  . Elevated glucose level 02/05/2018  . Gait instability 10/27/2017  . Environmental and seasonal allergies 02/14/2017  . Anxiety about health 12/10/2015  . Mixed hyperlipidemia 12/08/2015  . 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 Chronic pain 05/02/2015  . Osteoarthritis 06/16/2014  . Radiculopathy of lumbar region 06/16/2014  . 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 Chronic insomnia 06/12/2014  . Vitamin D deficiency 05/31/2014  . Neuralgia and neuritis 06/25/2013  . Arthritis, midfoot 06/18/2013    Allergies  Allergen Reactions  . Neurontin [Gabapentin] Swelling  . Zolpidem Other (See Comments)    sleepwalking     Past Surgical History:  Procedure Laterality Date  . AXILLARY SURGERY Right 2008  . CHOLECYSTECTOMY    . CYSTOCELE REPAIR N/A 02/08/2016   Procedure: ANTERIOR REPAIR (CYSTOCELE);  Surgeon: Boykin Nearing, MD;  Location: [facility] ORS;  Service: Gynecology;  Laterality: N/A;  . LAPAROSCOPIC GASTRIC SLEEVE RESECTION WITH HIATAL HERNIA REPAIR  2015  . TONSILLECTOMY    . TUBAL LIGATION    .  VAGINAL HYSTERECTOMY N/A 02/08/2016   Procedure: HYSTERECTOMY VAGINAL WITH BSO;  Surgeon: Boykin Nearing, MD;  Location: [facility] ORS;  Service: Gynecology;  Laterality: N/A;  . VARICOSE VEIN SURGERY      Social History   Tobacco Use  . Smoking status: Current Every Day Smoker    Packs/day: 0.10    Years: 60.00    Pack years: 6.00    Types: Cigarettes  . Smokeless tobacco: Never Used  . Tobacco comment: 2-3 daily  Vaping Use  . Vaping Use: Never used  Substance Use Topics  . Alcohol use: Not Currently    Alcohol/week: 0.0 standard drinks  . Drug use: No     Medication list has been reviewed and  updated.  Current Meds  Medication Sig  . B Complex-Biotin-FA (B-COMPLEX PO) Take 1 tablet by mouth daily.  . busPIRone (BUSPAR) 15 MG tablet Take 15 mg by mouth 3 (three) times daily.  . cyclobenzaprine (FLEXERIL) 10 MG tablet TAKE 1/2-1 TABLET BY MOUTH 3 TIMES A DAY AS NEEDED  . fluticasone (FLONASE) 50 MCG/ACT nasal spray PLACE 1 SPRAY INTO BOTH NOSTRILS DAILY.  . hydrochlorothiazide (HYDRODIURIL) 25 MG tablet Take 1 tablet (25 mg total) by mouth daily.  . hydrOXYzine (ATARAX/VISTARIL) 25 MG tablet TAKE 3 TABLETS (75 MG TOTAL) BY MOUTH AT BEDTIME AS NEEDED.  Marland Kitchen loratadine (CLARITIN) 10 MG tablet Take 10 mg by mouth daily as needed for allergies.  . methadone (DOLOPHINE) 1 mg/ml oral solution Take by mouth daily. 100 mg  . Multiple Vitamin (ONE-A-DAY 55 PLUS PO) Take 1 tablet by mouth daily.  Marland Kitchen olmesartan (BENICAR) 20 MG tablet Take 1 tablet (20 mg total) by mouth daily.  . ondansetron (ZOFRAN-ODT) 8 MG disintegrating tablet Take 1 tablet (8 mg total) by mouth every 8 (eight) hours as needed for nausea or vomiting.    PHQ 2/9 Scores 12/17/2020 08/25/2020 02/18/2020 02/06/2020  PHQ - 2 Score 0 2 4 4   PHQ- 9 Score 3 8 10 9     GAD 7 : Generalized Anxiety Score 12/17/2020 08/25/2020 02/18/2020 02/06/2020  Nervous, Anxious, on Edge 1 1 2 1   Control/stop worrying 1 1 2 1   Worry too much - different things 1 1 2 1   Trouble relaxing 0 0 2 1  Restless 0 1 0 1  Easily annoyed or irritable 0 0 0 0  Afraid - awful might happen 0 0 0 0  Total GAD 7 Score 3 4 8 5   Anxiety Difficulty - Not difficult at all Not difficult at all Not difficult at all    BP Readings from Last 3 Encounters:  12/17/20 126/78  10/02/20 128/85  08/25/20 (!) 148/88    Physical Exam Vitals and nursing note reviewed.  Constitutional:      General: She is not in acute distress.    Appearance: She is well-developed.  HENT:     Head: Normocephalic and atraumatic.     Right Ear: Tympanic membrane is not perforated,  erythematous or retracted.     Left Ear: Tympanic membrane is not perforated, erythematous or retracted.  Cardiovascular:     Rate and Rhythm: Normal rate and regular rhythm.     Heart sounds: No murmur heard.     Comments: Circulation in both hands intact Fingertips pink, cool  Both feet warm, ruddy coloration Unable to palpate DP or PT on either foot Pulmonary:     Effort: Pulmonary effort is normal. No respiratory distress.     Breath sounds: No  wheezing or rhonchi.  Musculoskeletal:     Cervical back: Normal range of motion.     Right lower leg: Edema present.     Left lower leg: Edema present.  Skin:    General: Skin is warm and dry.     Capillary Refill: Capillary refill takes 2 to 3 seconds.     Findings: No rash.  Neurological:     Mental Status: She is alert and oriented to person, place, and time.  Psychiatric:        Mood and Affect: Mood normal.        Behavior: Behavior normal.     Wt Readings from Last 3 Encounters:  12/17/20 173 lb (78.5 kg)  08/25/20 170 lb (77.1 kg)  06/27/20 160 lb (72.6 kg)    BP 126/78   Pulse 78   Temp 98.1 F (36.7 C) (Oral)   Ht 5\' 2"  (1.575 m)   Wt 173 lb (78.5 kg)   SpO2 100%   BMI 31.64 kg/m   Assessment and Plan: 1. Raynaud'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 Recommend avoiding cold water and ice  Wear gloves and generally keep hands warm Could try low dose amlodipine (but had LE edema on this in the past)  2. Arterial insufficiency of lower extremity ([place]) Recommend vascular evaluation - Ambulatory referral to Vascular Surgery  3. Tinnitus of both ears Chronic symptoms unchanged No evidence of ear infection today   Partially dictated using Editor, commissioning. Any errors are unintentional.  Halina Maidens, MD [facility]  12/17/2020

## 2020-12-25 NOTE — Progress Notes (Signed)
MRN : [id number]  Lori Trevino is a 70 y.o. (04-08-1951) female who presents with chief complaint of No chief complaint on file. Marland Kitchen  History of Present Illness:   The patient is seen for evaluation of painful lower extremities. Patient notes the pain is variable and not always associated with activity.  She notes that she wakes up with her legs being very red and extremely warm.  The pain is somewhat consistent day to day occurring on most days. The patient notes the pain also occurs with standing and routinely seems worse as the day wears on. The pain has been progressive over the past several years. The patient states these symptoms are causing  a profound negative impact on quality of life and daily activities.  She has had a venous ablation In the remote past.  The patient denies rest pain or dangling of an extremity off the side of the bed during the night for relief. No open wounds or sores at this time.  The patient is also seen for the evaluation of painful fingers and toes associated with Raynaud's changes. The patient notes the fingers and toes turned pale and then blue and become very painful. Exposure to cold environments makes the symptoms much worse. The changes have been going on for years and seemed to be much worse lately. There is no history of trauma or repetitive injury. The patient does note some peeling of the skin of the fingers in the palms and the skin of the toes on the soles.  The patient has not been taking Norvasc but did try it in the past and it caused severe swelling.  There is no history of malignancy or autoimmune disease.  The patient denies amaurosis fugax or recent TIA symptoms. There are no recent neurological changes noted. The patient denies claudication symptoms or rest pain symptoms. The patient denies history of DVT, PE or superficial thrombophlebitis. The patient denies recent episodes of angina or shortness of breath.    No outpatient  medications have been marked as taking for the 12/28/20 encounter (Appointment) with Delana Meyer, Dolores Lory, MD.    Past Medical History:  Diagnosis Date  . Allergy   . Hypertension   . Radiculopathy   . Tendonitis 11/2019   right wrist and right arm    Past Surgical History:  Procedure Laterality Date  . AXILLARY SURGERY Right 2008  . CHOLECYSTECTOMY    . CYSTOCELE REPAIR N/A 02/08/2016   Procedure: ANTERIOR REPAIR (CYSTOCELE);  Surgeon: Boykin Nearing, MD;  Location: [facility] ORS;  Service: Gynecology;  Laterality: N/A;  . LAPAROSCOPIC GASTRIC SLEEVE RESECTION WITH HIATAL HERNIA REPAIR  2015  . TONSILLECTOMY    . TUBAL LIGATION    . VAGINAL HYSTERECTOMY N/A 02/08/2016   Procedure: HYSTERECTOMY VAGINAL WITH BSO;  Surgeon: Boykin Nearing, MD;  Location: [facility] ORS;  Service: Gynecology;  Laterality: N/A;  . VARICOSE VEIN SURGERY      Social History Social History   Tobacco Use  . Smoking status: Current Every Day Smoker    Packs/day: 0.10    Years: 60.00    Pack years: 6.00    Types: Cigarettes  . Smokeless tobacco: Never Used  . Tobacco comment: 2-3 daily  Vaping Use  . Vaping Use: Never used  Substance Use Topics  . Alcohol use: Not Currently    Alcohol/week: 0.0 standard drinks  . Drug use: No    Family History Family History  Problem Relation Age of Onset  .  Stroke Mother   . Cancer Father   . Breast cancer Neg Hx   No family history of bleeding/clotting disorders, porphyria or autoimmune disease   Allergies  Allergen Reactions  . Neurontin [Gabapentin] Swelling  . Zolpidem Other (See Comments)    sleepwalking      REVIEW OF SYSTEMS (Negative unless checked)  Constitutional: [] Weight loss  [] Fever  [] Chills Cardiac: [] Chest pain   [] Chest pressure   [] Palpitations   [] Shortness of breath when laying flat   [] Shortness of breath with exertion. Vascular:  [x] Pain in legs with walking   [x] Pain in legs at rest  [] History of DVT   [] Phlebitis    [x] Swelling in legs   [x] Varicose veins   [] Non-healing ulcers Pulmonary:   [] Uses home oxygen   [] Productive cough   [] Hemoptysis   [] Wheeze  [] COPD   [] Asthma Neurologic:  [] Dizziness   [] Seizures   [] History of stroke   [] History of TIA  [] Aphasia   [] Vissual changes   [] Weakness or numbness in arm   [] Weakness or numbness in leg Musculoskeletal:   [] Joint swelling   [] Joint pain   [] Low back pain Hematologic:  [] Easy bruising  [] Easy bleeding   [] Hypercoagulable state   [] Anemic Gastrointestinal:  [] Diarrhea   [] Vomiting  [] Gastroesophageal reflux/heartburn   [] Difficulty swallowing. Genitourinary:  [] Chronic kidney disease   [] Difficult urination  [] Frequent urination   [] Blood in urine Skin:  [] Rashes   [] Ulcers  Psychological:  [] History of anxiety   []  History of major depression.  Physical Examination  There were no vitals filed for this visit. There is no height or weight on file to calculate BMI. Gen: WD/WN, NAD Head: [place]/AT, No temporalis wasting.  Ear/Nose/Throat: Hearing grossly intact, nares w/o erythema or drainage, poor dentition Eyes: PER, EOMI, sclera nonicteric.  Neck: Supple, no masses.  No bruit or JVD.  Pulmonary:  Good air movement, clear to auscultation bilaterally, no use of accessory muscles.  Cardiac: RRR, normal S1, S2, no Murmurs. Vascular: scattered varicosities present bilaterally.  Moderate venous stasis changes to the legs bilaterally.  2-3+ soft pitting edema. Vessel Right Left  Radial Palpable Palpable  PT Trace Palpable Palpable  DP Not Palpable Not Palpable  Gastrointestinal: soft, non-distended. No guarding/no peritoneal signs.  Musculoskeletal: M/S 5/5 throughout.  No deformity or atrophy.  Neurologic: CN 2-12 intact. Pain and light touch intact in extremities.  Symmetrical.  Speech is fluent. Motor exam as listed above. Psychiatric: Judgment intact, Mood & affect appropriate for pt's clinical situation. Dermatologic: Venous rashes no ulcers noted.   No changes consistent with cellulitis. Lymph : + lichenification / skin changes of chronic lymphedema.  CBC Lab Results  Component Value Date   WBC 7.3 10/02/2020   HGB 11.4 (L) 10/02/2020   HCT 33.1 (L) 10/02/2020   MCV 87.8 10/02/2020   PLT 211 10/02/2020    BMET    Component Value Date/Time   NA 129 (L) 10/02/2020 1403   K 3.7 10/02/2020 1403   CL 96 (L) 10/02/2020 1403   CO2 24 10/02/2020 1403   GLUCOSE 96 10/02/2020 1403   BUN 10 10/02/2020 1403   CREATININE 0.48 10/02/2020 1403   CALCIUM 8.7 (L) 10/02/2020 1403   GFRNONAA >60 10/02/2020 1403   GFRAA >60 12/18/2019 0829   CrCl cannot be calculated (Patient's most recent lab result is older than the maximum 21 days allowed.).  COAG No results found for: INR, PROTIME  Radiology No results found.   Assessment/Plan 1. Pain in both  lower extremities  Recommend:  The patient has atypical pain symptoms for pure atherosclerotic disease. However, on physical exam there is evidence of mixed venous and arterial disease, given the diminished pulses and the edema associated with venous changes of the legs.  Noninvasive studies including ABI's and venous ultrasound of the legs will be obtained and the patient will follow up with me to review these studies.  I suspect the patient is c/o pseudoclaudication.  Patient should have an evaluation of his LS spine which I defer to the primary service.  The patient should continue walking and begin a more formal exercise program. The patient should continue his antiplatelet therapy and aggressive treatment of the lipid abnormalities.  The patient should begin wearing graduated compression socks 15-20 mmHg strength to control edema.  - VAS [place] ABI WITH/WO TBI; Future - VAS [place] LOWER EXTREMITY VENOUS REFLUX; Future  2. Raynaud'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  Recommend:  The patient has atypical pain symptoms for pure atherosclerotic disease. However, on physical exam there is  evidence of mixed venous and arterial disease, given the diminished pulses and the edema associated with venous changes of the legs.  Noninvasive studies including ABI's and venous ultrasound of the legs will be obtained and the patient will follow up with me to review these studies.  I suspect the patient is c/o pseudoclaudication.  Patient should have an evaluation of his LS spine which I defer to the primary service.  The patient should continue walking and begin a more formal exercise program. The patient should continue his antiplatelet therapy and aggressive treatment of the lipid abnormalities.  The patient should begin wearing graduated compression socks 15-20 mmHg strength to control edema.  - VAS [place] ABI WITH/WO TBI; Future - VAS [place] LOWER EXTREMITY VENOUS REFLUX; Future  3. Essential hypertension Continue antihypertensive medications as already ordered, these medications have been reviewed and there are no changes at this time.   4. Radiculopathy of lumbar region Continue NSAID medications as already ordered, these medications have been reviewed and there are no changes at this time.  Continued activity and therapy was stressed.   5. Mixed hyperlipidemia Continue statin as ordered and reviewed, no changes at this time    Hortencia Pilar, MD  12/25/2020 3:03 PM

## 2020-12-28 ENCOUNTER — Other Ambulatory Visit: Payer: Self-pay

## 2020-12-28 ENCOUNTER — Encounter (INDEPENDENT_AMBULATORY_CARE_PROVIDER_SITE_OTHER): Payer: Self-pay | Admitting: Vascular Surgery

## 2020-12-28 ENCOUNTER — Ambulatory Visit (INDEPENDENT_AMBULATORY_CARE_PROVIDER_SITE_OTHER): Payer: Medicare Other | Admitting: Vascular Surgery

## 2020-12-28 VITALS — BP 180/98 | HR 83 | Resp 16 | Ht 62.0 in | Wt 177.8 lb

## 2020-12-28 DIAGNOSIS — I10 Essential (primary) hypertension: Secondary | ICD-10-CM

## 2020-12-28 DIAGNOSIS — M5416 Radiculopathy, lumbar region: Secondary | ICD-10-CM

## 2020-12-28 DIAGNOSIS — M79606 Pain in leg, unspecified: Secondary | ICD-10-CM | POA: Insufficient documentation

## 2020-12-28 DIAGNOSIS — I739 Peripheral vascular disease, unspecified: Secondary | ICD-10-CM

## 2020-12-28 DIAGNOSIS — I7300 Raynaud's syndrome without gangrene: Secondary | ICD-10-CM | POA: Diagnosis not present

## 2020-12-28 DIAGNOSIS — M79605 Pain in left leg: Secondary | ICD-10-CM

## 2020-12-28 DIAGNOSIS — M79604 Pain in right leg: Secondary | ICD-10-CM

## 2020-12-28 DIAGNOSIS — E782 Mixed hyperlipidemia: Secondary | ICD-10-CM

## 2021-01-21 ENCOUNTER — Ambulatory Visit (INDEPENDENT_AMBULATORY_CARE_PROVIDER_SITE_OTHER): Payer: Medicare Other | Admitting: Vascular Surgery

## 2021-01-21 ENCOUNTER — Encounter (INDEPENDENT_AMBULATORY_CARE_PROVIDER_SITE_OTHER): Payer: Medicare Other

## 2021-01-29 ENCOUNTER — Other Ambulatory Visit: Payer: Self-pay | Admitting: Internal Medicine

## 2021-01-29 DIAGNOSIS — F5104 Psychophysiologic insomnia: Secondary | ICD-10-CM

## 2021-02-02 ENCOUNTER — Encounter (INDEPENDENT_AMBULATORY_CARE_PROVIDER_SITE_OTHER): Payer: Medicare Other

## 2021-02-03 ENCOUNTER — Ambulatory Visit (INDEPENDENT_AMBULATORY_CARE_PROVIDER_SITE_OTHER): Payer: Medicare Other | Admitting: Nurse Practitioner

## 2021-02-03 ENCOUNTER — Encounter (INDEPENDENT_AMBULATORY_CARE_PROVIDER_SITE_OTHER): Payer: Medicare Other

## 2021-02-08 ENCOUNTER — Telehealth: Payer: Self-pay | Admitting: Internal Medicine

## 2021-02-08 ENCOUNTER — Other Ambulatory Visit: Payer: Self-pay | Admitting: Internal Medicine

## 2021-02-08 DIAGNOSIS — F5104 Psychophysiologic insomnia: Secondary | ICD-10-CM

## 2021-02-08 NOTE — Telephone Encounter (Signed)
Copied from [place] [phone]. Topic: Medicare AWV >> Feb 08, 2021 10:42 AM Cher Nakai R wrote: Reason for CRM:   Left message for patient to call back and schedule Medicare Annual Wellness Visit (AWV) in office.   If unable to come into the office for AWV,  please offer to do virtually or by telephone.  Last AWV: 11/18/2019  Please schedule at anytime with [facility] Advisor.  40 minute appointment  Any questions, please contact me at [phone]

## 2021-02-18 ENCOUNTER — Other Ambulatory Visit: Payer: Self-pay | Admitting: Internal Medicine

## 2021-02-18 DIAGNOSIS — H9313 Tinnitus, bilateral: Secondary | ICD-10-CM

## 2021-02-24 ENCOUNTER — Encounter (INDEPENDENT_AMBULATORY_CARE_PROVIDER_SITE_OTHER): Payer: Self-pay | Admitting: Nurse Practitioner

## 2021-02-24 ENCOUNTER — Ambulatory Visit (INDEPENDENT_AMBULATORY_CARE_PROVIDER_SITE_OTHER): Payer: Medicare Other | Admitting: Nurse Practitioner

## 2021-02-24 ENCOUNTER — Other Ambulatory Visit: Payer: Self-pay

## 2021-02-24 ENCOUNTER — Ambulatory Visit (INDEPENDENT_AMBULATORY_CARE_PROVIDER_SITE_OTHER): Payer: Medicare Other

## 2021-02-24 VITALS — BP 162/92 | HR 80 | Resp 16 | Wt 179.6 lb

## 2021-02-24 DIAGNOSIS — R600 Localized edema: Secondary | ICD-10-CM | POA: Diagnosis not present

## 2021-02-24 DIAGNOSIS — I7300 Raynaud's syndrome without gangrene: Secondary | ICD-10-CM | POA: Diagnosis not present

## 2021-02-24 DIAGNOSIS — M79605 Pain in left leg: Secondary | ICD-10-CM | POA: Diagnosis not present

## 2021-02-24 DIAGNOSIS — I10 Essential (primary) hypertension: Secondary | ICD-10-CM

## 2021-02-24 DIAGNOSIS — M79604 Pain in right leg: Secondary | ICD-10-CM

## 2021-03-03 ENCOUNTER — Other Ambulatory Visit: Payer: Self-pay | Admitting: Internal Medicine

## 2021-03-03 DIAGNOSIS — I10 Essential (primary) hypertension: Secondary | ICD-10-CM

## 2021-03-03 DIAGNOSIS — F5104 Psychophysiologic insomnia: Secondary | ICD-10-CM

## 2021-03-03 NOTE — Telephone Encounter (Signed)
Last seen 2 months ago  No future visit at this time .

## 2021-03-03 NOTE — Telephone Encounter (Signed)
Requested medication (s) are due for refill today: expired medication  Requested medication (s) are on the active medication list: no  Last refill:  01/29/21-02/28/21 #90 0 refills  Future visit scheduled: no  Notes to clinic:  expired medication. Do you want to renew Rx?     Requested Prescriptions  Pending Prescriptions Disp Refills   hydrOXYzine (ATARAX/VISTARIL) 25 MG tablet [Pharmacy Med Name: HYDROXYZINE HCL 25 MG TABLET] 90 tablet 0    Sig: TAKE 3 TABLETS (75 MG TOTAL) BY MOUTH AT BEDTIME AS NEEDED.      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   [facility] Glean Hess, MD   6 months ago Essential hypertension   [facility] Glean Hess, MD   1 year ago Essential hypertension   [facility] Glean Hess, MD   1 year ago Environmental and seasonal allergies   [facility] Glean Hess, MD   1 year ago Sensorineural hearing loss (SNHL) of both ears   [facility] Glean Hess, MD                 Signed Prescriptions Disp Refills   hydrochlorothiazide (HYDRODIURIL) 25 MG tablet 90 tablet 0    Sig: TAKE 1 TABLET BY MOUTH EVERY DAY      Cardiovascular: Diuretics - Thiazide Failed - 03/03/2021 12:00 PM      Failed - Ca in normal range and within 360 days    Calcium  Date Value Ref Range Status  10/02/2020 8.7 (L) 8.9 - 10.3 mg/dL Final          Failed - Na in normal range and within 360 days    Sodium  Date Value Ref Range Status  10/02/2020 129 (L) 135 - 145 mmol/L Final          Failed - Last BP in normal range    BP Readings from Last 1 Encounters:  02/24/21 (!) 162/92          Passed - Cr in normal range and within 360 days    Creatinine, Ser  Date Value Ref Range Status  10/02/2020 0.48 0.44 - 1.00 mg/dL  Final          Passed - K in normal range and within 360 days    Potassium  Date Value Ref Range Status  10/02/2020 3.7 3.5 - 5.1 mmol/L Final          Passed - Valid encounter within last 6 months    Recent Outpatient Visits           2 months ago Raynaud'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   [facility] Glean Hess, MD   6 months ago Essential hypertension   [place], Laura H, MD   1 year ago Essential hypertension   [facility] Glean Hess, MD   1 year ago Environmental and seasonal allergies   [facility] Glean Hess, MD   1 year ago Sensorineural hearing loss (SNHL) of both ears   [facility] Glean Hess, MD                  olmesartan (BENICAR) 20 MG tablet 90 tablet  0    Sig: TAKE 1 TABLET BY MOUTH EVERY DAY      Cardiovascular:  Angiotensin Receptor Blockers Failed - 03/03/2021 12:00 PM      Failed - Last BP in normal range    BP Readings from Last 1 Encounters:  02/24/21 (!) 162/92          Passed - Cr in normal range and within 180 days    Creatinine, Ser  Date Value Ref Range Status  10/02/2020 0.48 0.44 - 1.00 mg/dL Final          Passed - K in normal range and within 180 days    Potassium  Date Value Ref Range Status  10/02/2020 3.7 3.5 - 5.1 mmol/L Final          Passed - Patient is not pregnant      Passed - Valid encounter within last 6 months    Recent Outpatient Visits           2 months ago Raynaud'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   [facility] Glean Hess, MD   6 months ago Essential hypertension   [facility] Glean Hess, MD   1 year ago Essential hypertension   [facility] Glean Hess, MD   1 year ago Environmental and seasonal allergies   [facility] Glean Hess, MD   1 year ago Sensorineural hearing loss (SNHL) of both ears   [facility] Glean Hess, MD

## 2021-03-06 ENCOUNTER — Encounter (INDEPENDENT_AMBULATORY_CARE_PROVIDER_SITE_OTHER): Payer: Self-pay | Admitting: Nurse Practitioner

## 2021-03-06 NOTE — Progress Notes (Signed)
Subjective:    Patient ID: Lori Trevino, female    DOB: [birth year], 70 y.o.   MRN: [id number] Chief Complaint  Patient presents with  . Follow-up    Ultrasound follow up    Lori Trevino is a 70 year old female that returns today for evaluation of painful lower extremities.  The pain is variable and not always associated with activities.  The patient does have history of Raynaud's disease.  She has somewhat painful fingers and toes associated with Raynaud's changes.  The fingers turn pale and notes that exposure to cold and garments makes them worse.  These changes have been going on for years.  Note there is no history of repetitive injuries.  The patient also has a history of scoliosis and sciatica.  She does have known lumbar radiculopathy.  She also her pain in her legs occurs with standing and tends to get worse as the day wears on.  The patient also has severe swelling in her legs.  She has tried amlodipine previously for the Raynaud's but it made the swelling much worse.  The patient has also had a previous history of an endovenous laser ablation.  She denies classic claudication symptoms.  She denies rest pain.  There is no open wounds or ulcers.  The patient has evidence of deep venous insufficiency in her bilateral lower extremities.  No superficial venous reflux in her right lower extremity.  Left lower extremity has superficial venous reflux in the great saphenous vein at the saphenofemoral junction extending to the knee.  No evidence of DVT or superficial thrombophlebitis.  The patient has an ABI of 1.20 on the right and 1.18 on the left.  The patient has triphasic tibial artery waveforms and normal toe waveforms bilaterally.   Review of Systems  Cardiovascular: Positive for leg swelling.  Skin: Positive for color change.  All other systems reviewed and are negative.      Objective:   Physical Exam Vitals reviewed.  HENT:     Head: Normocephalic.  Neurological:     Mental  Status: She is alert.  Psychiatric:        Mood and Affect: Mood normal.        Behavior: Behavior normal.        Thought Content: Thought content normal.        Judgment: Judgment normal.     BP (!) 162/92 (BP Location: Right Arm)   Pulse 80   Resp 16   Wt 179 lb 9.6 oz (81.5 kg)   BMI 32.85 kg/m   Past Medical History:  Diagnosis Date  . Allergy   . Hypertension   . Radiculopathy   . Tendonitis 11/2019   right wrist and right arm    Social History   Socioeconomic History  . Marital status: Single    Spouse name: Not on file  . Number of children: Not on file  . Years of education: Not on file  . Highest education level: Not on file  Occupational History  . Not on file  Tobacco Use  . Smoking status: Current Every Day Smoker    Packs/day: 0.10    Years: 60.00    Pack years: 6.00    Types: Cigarettes  . Smokeless tobacco: Never Used  . Tobacco comment: 2-3 daily  Vaping Use  . Vaping Use: Never used  Substance and Sexual Activity  . Alcohol use: Not Currently    Alcohol/week: 0.0 standard drinks  . Drug use: No  .  Sexual activity: Yes  Other Topics Concern  . Not on file  Social History Narrative  . Not on file   Social Determinants of Health   Financial Resource Strain: Not on file  Food Insecurity: Not on file  Transportation Needs: Not on file  Physical Activity: Not on file  Stress: Not on file  Social Connections: Not on file  Intimate Partner Violence: Not on file    Past Surgical History:  Procedure Laterality Date  . AXILLARY SURGERY Right 2008  . CHOLECYSTECTOMY    . CYSTOCELE REPAIR N/A 02/08/2016   Procedure: ANTERIOR REPAIR (CYSTOCELE);  Surgeon: Boykin Nearing, MD;  Location: [facility] ORS;  Service: Gynecology;  Laterality: N/A;  . LAPAROSCOPIC GASTRIC SLEEVE RESECTION WITH HIATAL HERNIA REPAIR  2015  . TONSILLECTOMY    . TUBAL LIGATION    . VAGINAL HYSTERECTOMY N/A 02/08/2016   Procedure: HYSTERECTOMY VAGINAL WITH BSO;  Surgeon:  Boykin Nearing, MD;  Location: [facility] ORS;  Service: Gynecology;  Laterality: N/A;  . VARICOSE VEIN SURGERY      Family History  Problem Relation Age of Onset  . Stroke Mother   . Cancer Father   . Breast cancer Neg Hx     Allergies  Allergen Reactions  . Neurontin [Gabapentin] Swelling  . Zolpidem Other (See Comments)    sleepwalking     CBC Latest Ref Rng & Units 10/02/2020 12/18/2019 10/27/2017  WBC 4.0 - 10.5 K/uL 7.3 5.9 4.7  Hemoglobin 12.0 - 15.0 g/dL 11.4(L) 13.0 14.0  Hematocrit 36.0 - 46.0 % 33.1(L) 39.0 41.6  Platelets 150 - 400 K/uL 211 244 246      CMP     Component Value Date/Time   NA 129 (L) 10/02/2020 1403   K 3.7 10/02/2020 1403   CL 96 (L) 10/02/2020 1403   CO2 24 10/02/2020 1403   GLUCOSE 96 10/02/2020 1403   BUN 10 10/02/2020 1403   CREATININE 0.48 10/02/2020 1403   CALCIUM 8.7 (L) 10/02/2020 1403   PROT 7.5 12/18/2019 0829   ALBUMIN 4.6 12/18/2019 0829   AST 24 12/18/2019 0829   ALT 18 12/18/2019 0829   ALKPHOS 52 12/18/2019 0829   BILITOT 0.8 12/18/2019 0829   GFRNONAA >60 10/02/2020 1403   GFRAA >60 12/18/2019 0829     No results found.     Assessment & Plan:   1. Raynaud'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 Noninvasive studies do not reveal acute vascular compromise.  I discussed with patient the treatment for Raynaud's disease which is typically what medications have altered her blood pressure.  Currently she is not on a calcium channel blocker.  Given her edema with introduction of this may make this worse.  She is currently on an ARB which is also indicated for treatment of Raynaud's phenomenon.  Currently the patient's symptoms are mild.  Patient is advised to utilize warm gloves or socks during the times of year where it is worse which is the spring and fall.  Patient advised to contact us if the pain increases or if she begins to develop ulcerations.  2. Pain in both lower extremities While the patient does have Raynaud's disease,  based upon the patient's noninvasive studies I suspect that her pain is more so related to her back.  The patient does have a history of scoliosis with sciatica.  She also has lumbar radiculopathy.  Patient is advised to follow-up with either primary care physician or orthopedic physician for further work-up and possible planning.  3. Essential  hypertension Continue antihypertensive medications as already ordered, these medications have been reviewed and there are no changes at this time.   4. Pedal edema The patient does have pedal edema.  Based on her noninvasive studies she has extensive superficial reflux in the left with deep venous insufficiency bilaterally.  The patient has not tried conservative therapy consistently.  The patient is advised to utilize medical grade 1 compression stockings on a daily basis.  The patient elevate her lower extremities is much as possible when at home.  I stressed that if she can above the level of the heart is optimal.  Exercise is also helpful for the patient in order to help with controlling edema if it is tolerable for her.  We will have the patient return in 3 months after conservative therapy discussed possible lymphedema pump or intervention for her left lower extremity.   Current Outpatient Medications on File Prior to Visit  Medication Sig Dispense Refill  . B Complex-Biotin-FA (B-COMPLEX PO) Take 1 tablet by mouth daily.    . busPIRone (BUSPAR) 15 MG tablet Take 15 mg by mouth 3 (three) times daily.    . cyclobenzaprine (FLEXERIL) 10 MG tablet TAKE 1/2-1 TABLET BY MOUTH 3 TIMES A DAY AS NEEDED    . fluticasone (FLONASE) 50 MCG/ACT nasal spray PLACE 1 SPRAY INTO BOTH NOSTRILS DAILY. 16 mL 2  . loratadine (CLARITIN) 10 MG tablet Take 10 mg by mouth daily as needed for allergies.    . methadone (DOLOPHINE) 1 mg/ml oral solution Take by mouth daily. 100 mg    . Multiple Vitamin (ONE-A-DAY 55 PLUS PO) Take 1 tablet by mouth daily.    . ondansetron  (ZOFRAN-ODT) 8 MG disintegrating tablet TAKE 1 TABLET (8 MG TOTAL) BY MOUTH EVERY 8 (EIGHT) HOURS AS NEEDED FOR NAUSEA OR VOMITING. 20 tablet 0  . furosemide (LASIX) 20 MG tablet Take 1 tablet (20 mg total) by mouth daily as needed. (Patient not taking: No sig reported) 10 tablet 0  . [DISCONTINUED] amLODipine (NORVASC) 5 MG tablet TAKE 1 TABLET (5 MG TOTAL) BY MOUTH DAILY. FOR HIGH BLOOD PRESSURE 30 tablet 5   No current facility-administered medications on file prior to visit.    There are no Patient Instructions on file for this visit. No follow-ups on file.   Kris Hartmann, NP

## 2021-03-23 ENCOUNTER — Telehealth: Payer: Self-pay

## 2021-03-23 NOTE — Telephone Encounter (Signed)
Scheduled pt for 03/24/21  Copied from [place] #[id number]. Topic: General - Other >> Mar 23, 2021  2:54 PM Tessa Lerner A wrote: Reason for CRM: Patient would like to be prescribed an antibiotic to address concerns related to cellulitis in their legs   The patient declined to schedule an appt with their PCP  Please contact to further advise

## 2021-03-23 NOTE — Telephone Encounter (Signed)
Copied from [place] [id number]. Topic: General - Other >> Mar 23, 2021  2:54 PM Lori Trevino A wrote: Reason for CRM: Patient would like to be prescribed an antibiotic to address concerns related to cellulitis in their legs   The patient declined to schedule an appt with their PCP  Please contact to further advise

## 2021-03-24 ENCOUNTER — Ambulatory Visit: Payer: Medicare Other | Admitting: Internal Medicine

## 2021-03-29 ENCOUNTER — Other Ambulatory Visit: Payer: Self-pay | Admitting: Internal Medicine

## 2021-03-29 DIAGNOSIS — F5104 Psychophysiologic insomnia: Secondary | ICD-10-CM

## 2021-03-29 NOTE — Telephone Encounter (Signed)
   Notes to clinic:  requesting a 90 day supply    Requested Prescriptions  Pending Prescriptions Disp Refills   hydrOXYzine (ATARAX/VISTARIL) 25 MG tablet [Pharmacy Med Name: HYDROXYZINE HCL 25 MG TABLET] 270 tablet 1    Sig: TAKE 3 TABLETS (75 MG TOTAL) BY MOUTH AT BEDTIME AS NEEDED.      Ear, Nose, and Throat:  Antihistamines Passed - 03/29/2021  1:31 PM      Passed - Valid encounter within last 12 months    Recent Outpatient Visits           3 months ago Raynaud'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   [place], MD   7 months ago Essential hypertension   [facility] Glean Hess, MD   1 year ago Essential hypertension   [facility] Glean Hess, MD   1 year ago Environmental and seasonal allergies   [facility] Glean Hess, MD   1 year ago Sensorineural hearing loss (SNHL) of both ears   [facility] Glean Hess, MD       Future Appointments             In 2 days Glean Hess, MD [facility]

## 2021-03-31 ENCOUNTER — Ambulatory Visit: Payer: Medicare Other | Admitting: Internal Medicine

## 2021-04-06 ENCOUNTER — Telehealth: Payer: Self-pay | Admitting: Internal Medicine

## 2021-04-06 NOTE — Telephone Encounter (Signed)
Copied from [place] [id number]. Topic: Medicare AWV >> Apr 06, 2021 10:44 AM Cher Nakai R wrote: Reason for CRM:  Left message for patient to call back and schedule Medicare Annual Wellness Visit (AWV) in office.   If unable to come into the office for AWV,  please offer to do virtually or by telephone.  Last AWV: 11/18/2019   Please schedule at anytime with [place].  40 minute appointment  Any questions, please contact me at [phone]

## 2021-04-09 ENCOUNTER — Ambulatory Visit: Payer: Medicare Other | Admitting: Internal Medicine

## 2021-04-16 ENCOUNTER — Ambulatory Visit: Payer: Medicare Other | Admitting: Internal Medicine

## 2021-04-22 ENCOUNTER — Ambulatory Visit: Payer: Medicare Other | Admitting: Internal Medicine

## 2021-04-27 ENCOUNTER — Other Ambulatory Visit: Payer: Self-pay | Admitting: Internal Medicine

## 2021-04-27 DIAGNOSIS — I10 Essential (primary) hypertension: Secondary | ICD-10-CM

## 2021-04-27 NOTE — Telephone Encounter (Signed)
Requested Prescriptions  Pending Prescriptions Disp Refills  . olmesartan (BENICAR) 20 MG tablet [Pharmacy Med Name: OLMESARTAN MEDOXOMIL 20 MG TAB] 90 tablet 0    Sig: TAKE 1 TABLET BY MOUTH EVERY DAY     Cardiovascular:  Angiotensin Receptor Blockers Failed - 04/27/2021  7:03 PM      Failed - Cr in normal range and within 180 days    Creatinine, Ser  Date Value Ref Range Status  10/02/2020 0.48 0.44 - 1.00 mg/dL Final         Failed - K in normal range and within 180 days    Potassium  Date Value Ref Range Status  10/02/2020 3.7 3.5 - 5.1 mmol/L Final         Failed - Last BP in normal range    BP Readings from Last 1 Encounters:  02/24/21 (!) 162/92         Passed - Patient is not pregnant      Passed - Valid encounter within last 6 months    Recent Outpatient Visits          4 months ago Raynaud'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   [place], MD   8 months ago Essential hypertension   [place], Laura H, MD   1 year ago Essential hypertension   [facility] Glean Hess, MD   1 year ago Environmental and seasonal allergies   [facility] Glean Hess, MD   1 year ago Sensorineural hearing loss (SNHL) of both ears   [facility] Glean Hess, MD      Future Appointments            In 2 days Glean Hess, MD [facility], [facility]

## 2021-04-29 ENCOUNTER — Ambulatory Visit: Payer: Medicare Other | Admitting: Internal Medicine

## 2021-05-13 ENCOUNTER — Other Ambulatory Visit: Payer: Self-pay | Admitting: Internal Medicine

## 2021-05-13 DIAGNOSIS — I10 Essential (primary) hypertension: Secondary | ICD-10-CM

## 2021-05-13 NOTE — Telephone Encounter (Signed)
Will need appt in 3 months

## 2021-05-27 ENCOUNTER — Ambulatory Visit (INDEPENDENT_AMBULATORY_CARE_PROVIDER_SITE_OTHER): Payer: Medicare Other | Admitting: Vascular Surgery

## 2021-06-12 ENCOUNTER — Ambulatory Visit (INDEPENDENT_AMBULATORY_CARE_PROVIDER_SITE_OTHER): Payer: Medicare Other | Admitting: *Deleted

## 2021-06-12 ENCOUNTER — Other Ambulatory Visit: Payer: Self-pay

## 2021-06-12 DIAGNOSIS — Z0000 Encounter for general adult medical examination without abnormal findings: Secondary | ICD-10-CM | POA: Diagnosis not present

## 2021-06-12 NOTE — Progress Notes (Signed)
Subjective:   Lori Trevino is a 70 y.o. female who presents for Medicare Annual (Subsequent) preventive examination.   I connected with  Lori Trevino on 06/12/21 by a Audio enabled telemedicine application and verified that I am speaking with the correct person using two identifiers.   I discussed the limitations of evaluation and management by telemedicine. The patient expressed understanding and agreed to proceed.   Patient Location: Home Staff Location: Office Person Participating in Visit: Lori, Trevino; Lori Trevino, RMA     Objective:    Today's Vitals   06/12/21 0913  PainSc: 0-No pain   There is no height or weight on file to calculate BMI.  Advanced Directives 06/12/2021 10/02/2020 06/27/2020 11/18/2019 10/07/2017 10/02/2017 09/15/2017  Does Patient Have a Medical Advance Directive? No No No No No No No  Would patient like information on creating a medical advance directive? No - Patient declined - - Yes (MAU/Ambulatory/Procedural Areas - Information given) - - -    Current Medications (verified) Outpatient Encounter Medications as of 06/12/2021  Medication Sig   B Complex-Biotin-FA (B-COMPLEX PO) Take 1 tablet by mouth daily.   busPIRone (BUSPAR) 15 MG tablet Take 15 mg by mouth 3 (three) times daily.   cyclobenzaprine (FLEXERIL) 10 MG tablet TAKE 1/2-1 TABLET BY MOUTH 3 TIMES A DAY AS NEEDED   fluticasone (FLONASE) 50 MCG/ACT nasal spray PLACE 1 SPRAY INTO BOTH NOSTRILS DAILY.   furosemide (LASIX) 20 MG tablet Take 1 tablet (20 mg total) by mouth daily as needed.   hydrochlorothiazide (HYDRODIURIL) 25 MG tablet TAKE 1 TABLET BY MOUTH EVERY DAY   loratadine (CLARITIN) 10 MG tablet Take 10 mg by mouth daily as needed for allergies.   methadone (DOLOPHINE) 1 mg/ml oral solution Take by mouth daily. 100 mg   Multiple Vitamin (ONE-A-DAY 55 PLUS PO) Take 1 tablet by mouth daily.   olmesartan (BENICAR) 20 MG tablet TAKE 1 TABLET BY MOUTH EVERY DAY   ondansetron  (ZOFRAN-ODT) 8 MG disintegrating tablet TAKE 1 TABLET (8 MG TOTAL) BY MOUTH EVERY 8 (EIGHT) HOURS AS NEEDED FOR NAUSEA OR VOMITING.   [DISCONTINUED] amLODipine (NORVASC) 5 MG tablet TAKE 1 TABLET (5 MG TOTAL) BY MOUTH DAILY. FOR HIGH BLOOD PRESSURE   No facility-administered encounter medications on file as of 06/12/2021.    Allergies (verified) Neurontin [gabapentin] and Zolpidem   History: Past Medical History:  Diagnosis Date   Allergy    Hypertension    Radiculopathy    Tendonitis 11/2019   right wrist and right arm   Past Surgical History:  Procedure Laterality Date   AXILLARY SURGERY Right 2008   CHOLECYSTECTOMY     CYSTOCELE REPAIR N/A 02/08/2016   Procedure: ANTERIOR REPAIR (CYSTOCELE);  Surgeon: Boykin Nearing, MD;  Location: [facility] ORS;  Service: Gynecology;  Laterality: N/A;   LAPAROSCOPIC GASTRIC SLEEVE RESECTION WITH HIATAL HERNIA REPAIR  2015   TONSILLECTOMY     TUBAL LIGATION     VAGINAL HYSTERECTOMY N/A 02/08/2016   Procedure: HYSTERECTOMY VAGINAL WITH BSO;  Surgeon: Boykin Nearing, MD;  Location: [facility] OR[facility];  Service: Gynecology;  Laterality: N/A;   VARICOSE VEIN SURGERY     Family History  Problem Relation Age of Onset   Stroke Mother    Cancer Father    Breast cancer Neg Hx    Social History   Socioeconomic History   Marital status: Divorced    Spouse name: N/A   Number of children: 0   Years of education: 80  Highest education level: Some college, no degree  Occupational History   Not on file  Tobacco Use   Smoking status: Every Day    Packs/day: 0.10    Years: 60.00    Pack years: 6.00    Types: Cigarettes   Smokeless tobacco: Never   Tobacco comments:    2-3 daily  Vaping Use   Vaping Use: Never used  Substance and Sexual Activity   Alcohol use: Not Currently    Alcohol/week: 0.0 standard drinks   Drug use: No   Sexual activity: Yes  Other Topics Concern   Not on file  Social History Narrative   Not on file   Social  Determinants of Health   Financial Resource Strain: Low Risk    Difficulty of Paying Living Expenses: Not hard at all  Food Insecurity: No Food Insecurity   Worried About Charity fundraiser in the Last Year: Never true   Ran Out of Food in the Last Year: Never true  Transportation Needs: No Transportation Needs   Lack of Transportation (Medical): No   Lack of Transportation (Non-Medical): No  Physical Activity: Inactive   Days of Exercise per Week: 0 days   Minutes of Exercise per Session: 0 min  Stress: Stress Concern Present   Feeling of Stress : To some extent  Social Connections: Socially Isolated   Frequency of Communication with Friends and Family: More than three times a week   Frequency of Social Gatherings with Friends and Family: More than three times a week   Attends Religious Services: Never   Marine scientist or Organizations: No   Attends Music therapist: Never   Marital Status: Divorced    Tobacco Counseling Ready to quit: Yes Counseling given: Yes Tobacco comments: 2-3 daily   Clinical Intake:   Pain : 0-10 Pain Score: 0-No pain   Nutritional Risks: None Diabetes: No  How often do you need to have someone help you when you read instructions, pamphlets, or other written materials from your doctor or pharmacy?: 1 - Never What is the last grade level you completed in school?: 12 and some College  Diabetic?No  Interpreter Needed?: No   Activities of Daily Living In your present state of health, do you have any difficulty performing the following activities: 06/12/2021 12/17/2020  Hearing? Y N  Comment Per pt a little -  Vision? N N  Difficulty concentrating or making decisions? N N  Walking or climbing stairs? Y N  Comment legs are weaker these days -  Dressing or bathing? N N  Doing errands, shopping? N N  Some recent data might be hidden    Patient Care Team: Glean Hess, MD as PCP - General (Internal  Medicine) Sharlet Salina, MD as Referring Physician (Physical Medicine and Rehabilitation) Beverly Gust, MD (Otolaryngology) Randol Kern, MD (Psychiatry)  Indicate any recent Medical Services you may have received from other than [facility] providers in the past year (date may be approximate).     Assessment:   This is a routine wellness examination for Lori Trevino.  Hearing/Vision screen No results found.    Goals Addressed   None   Depression Screen PHQ 2/9 Scores 06/12/2021 12/17/2020 08/25/2020 02/18/2020 02/06/2020 11/18/2019 10/07/2019  PHQ - 2 Score 0 0 '2 4 4 '$ 0 4  PHQ- 9 Score 0 '3 8 10 9 7 17    '$ Fall Risk Fall Risk  06/12/2021 12/17/2020 08/25/2020 02/18/2020 02/06/2020  Falls in the past year? 0  0 0 0 1  Number falls in past yr: 0 - 0 0 0  Injury with Fall? 0 - 0 0 0  Risk for fall due to : No Fall Risks - No Fall Risks No Fall Risks No Fall Risks  Follow up Falls evaluation completed Falls evaluation completed Follow up appointment Falls evaluation completed Falls evaluation completed    [place]:  Any stairs in or around the home? No  If so, are there any without handrails? No  Home free of loose throw rugs in walkways, pet beds, electrical cords, etc? No  Adequate lighting in your home to reduce risk of falls? Yes   ASSISTIVE DEVICES UTILIZED TO PREVENT FALLS:  Life alert? No  Use of a cane, walker or w/c? Yes  (Once in a while a Cane) Grab bars in the bathroom? No  Shower chair or bench in shower? No  Elevated toilet seat or a handicapped toilet? No   Cognitive Function:     6CIT Screen 06/12/2021  What Year? 0 points  What month? 0 points  What time? 0 points  Count back from 20 0 points  Months in reverse 0 points  Repeat phrase 0 points  Total Score 0    Immunizations Immunization History  Administered Date(s) Administered   Influenza, High Dose Seasonal PF 10/27/2017   Influenza-Unspecified 09/08/2015, 10/22/2016    Moderna Sars-Covid-2 Vaccination 12/28/2019, 01/25/2020, 09/15/2020   Pneumococcal Conjugate-13 10/27/2017   Tdap 07/16/2014    TDAP status: Up to date  Flu Vaccine status: Due, Education has been provided regarding the importance of this vaccine. Advised may receive this vaccine at local pharmacy or Health Dept. Aware to provide a copy of the vaccination record if obtained from local pharmacy or Health Dept. Verbalized acceptance and understanding.  Pneumococcal vaccine status: Due, Education has been provided regarding the importance of this vaccine. Advised may receive this vaccine at local pharmacy or Health Dept. Aware to provide a copy of the vaccination record if obtained from local pharmacy or Health Dept. Verbalized acceptance and understanding.  Covid-19 vaccine status: Information provided on how to obtain vaccines.   Qualifies for Shingles Vaccine? Yes   Zostavax completed No   Shingrix Completed?: No.    Education has been provided regarding the importance of this vaccine. Patient has been advised to call insurance company to determine out of pocket expense if they have not yet received this vaccine. Advised may also receive vaccine at local pharmacy or Health Dept. Verbalized acceptance and understanding.  Screening Tests Health Maintenance  Topic Date Due   Zoster Vaccines- Shingrix (1 of 2) Never done   COVID-19 Vaccine (4 - Booster for Moderna series) 01/14/2021   INFLUENZA VACCINE  05/03/2021   MAMMOGRAM  12/17/2021 (Originally 06/16/2017)   DEXA SCAN  12/17/2021 (Originally 05/07/2014)   Hepatitis C Screening  12/17/2021 (Originally 05/08/1969)   PNA vac Low Risk Adult (2 of 2 - PPSV23) 12/17/2021 (Originally 10/27/2018)   Fecal DNA (Cologuard)  12/11/2022   TETANUS/TDAP  10/27/2025   HPV VACCINES  Aged Out    Health Maintenance  Health Maintenance Due  Topic Date Due   Zoster Vaccines- Shingrix (1 of 2) Never done   COVID-19 Vaccine (4 - Booster for Moderna series)  01/14/2021   INFLUENZA VACCINE  05/03/2021    Colorectal cancer screening: Type of screening: Cologuard. Completed 12/11/2019. Repeat every 3 years  Mammogram status: Completed 06/17/2015. Repeat in 2 years (2023).  Bone  Density status: Pt provided with contact info and advised to call to schedule appt.  Lung Cancer Screening: (Low Dose CT Chest recommended if Age 59-80 years, 30 pack-year currently smoking OR have quit w/in 15years.) does qualify.   Lung Cancer Screening Referral: Patient will call office to get Referral  Additional Screening:  Hepatitis C Screening: does qualify; Patient will call office to get completed  Vision Screening: Recommended annual ophthalmology exams for early detection of glaucoma and other disorders of the eye. Is the patient up to date with their annual eye exam?  No  Who is the provider or what is the name of the office in which the patient attends annual eye exams? Patient do not have a Eye Doctor but will call PCP for a Referral If pt is not established with a provider, would they like to be referred to a provider to establish care? Yes .   Dental Screening: Recommended annual dental exams for proper oral hygiene  Community Resource Referral / Chronic Care Management: CRR required this visit?  Yes   CCM required this visit?  No      Plan:     I have personally reviewed and noted the following in the patient's chart:   Medical and social history Use of alcohol, tobacco or illicit drugs  Current medications and supplements including opioid prescriptions.  Functional ability and status Nutritional status Physical activity Advanced directives List of other physicians Hospitalizations, surgeries, and ER visits in previous 12 months Vitals Screenings to include cognitive, depression, and falls Referrals and appointments  In addition, I have reviewed and discussed with patient certain preventive protocols, quality metrics, and best  practice recommendations. A written personalized care plan for preventive services as well as general preventive health recommendations were provided to patient.     Lori Trevino [place], [place]   0000000   Nurse Notes: None Face to Face 45 mins  During Telephone visit, patient was alert and verbalized understanding with all that was stated.  Patient did not have anyone accompanied her with the visit.  Patient stated she lives with her friend/roommate.  Patient stated she have not had her eyes checked recently and have not had her hearing checked for about a 1-2 years now.  Per pt she was referred to a hearing doctor but she did not like him.  Per pt that provider tried to say she needed hearing aide but she don't.  Per pt she only have ringing in her ears from time to time.  Per pt she does need her vision checked.  Staff informed patient to please call PCP office to schedule appt for her to catch up on her vaccines and screenings that are required.  Patient agreed and verbalized understanding.

## 2021-09-12 ENCOUNTER — Emergency Department: Payer: Medicare Other

## 2021-09-12 ENCOUNTER — Other Ambulatory Visit: Payer: Self-pay

## 2021-09-12 ENCOUNTER — Inpatient Hospital Stay
Admission: EM | Admit: 2021-09-12 | Discharge: 2021-09-15 | DRG: 178 | Disposition: A | Discharge status: Home / Self Care | Payer: Medicare Other | Attending: Internal Medicine | Admitting: Internal Medicine

## 2021-09-12 DIAGNOSIS — Z9049 Acquired absence of other specified parts of digestive tract: Secondary | ICD-10-CM

## 2021-09-12 DIAGNOSIS — J4489 Other specified chronic obstructive pulmonary disease: Secondary | ICD-10-CM | POA: Diagnosis present

## 2021-09-12 DIAGNOSIS — Z9884 Bariatric surgery status: Secondary | ICD-10-CM

## 2021-09-12 DIAGNOSIS — I7300 Raynaud's syndrome without gangrene: Secondary | ICD-10-CM | POA: Diagnosis present

## 2021-09-12 DIAGNOSIS — Z888 Allergy status to other drugs, medicaments and biological substances status: Secondary | ICD-10-CM

## 2021-09-12 DIAGNOSIS — E871 Hypo-osmolality and hyponatremia: Secondary | ICD-10-CM

## 2021-09-12 DIAGNOSIS — U071 COVID-19: Secondary | ICD-10-CM | POA: Diagnosis not present

## 2021-09-12 DIAGNOSIS — Z79891 Long term (current) use of opiate analgesic: Secondary | ICD-10-CM

## 2021-09-12 DIAGNOSIS — J449 Chronic obstructive pulmonary disease, unspecified: Secondary | ICD-10-CM | POA: Diagnosis present

## 2021-09-12 DIAGNOSIS — G8929 Other chronic pain: Secondary | ICD-10-CM | POA: Diagnosis present

## 2021-09-12 DIAGNOSIS — R531 Weakness: Secondary | ICD-10-CM

## 2021-09-12 DIAGNOSIS — F17210 Nicotine dependence, cigarettes, uncomplicated: Secondary | ICD-10-CM | POA: Diagnosis present

## 2021-09-12 DIAGNOSIS — Z79899 Other long term (current) drug therapy: Secondary | ICD-10-CM

## 2021-09-12 DIAGNOSIS — E782 Mixed hyperlipidemia: Secondary | ICD-10-CM | POA: Diagnosis present

## 2021-09-12 DIAGNOSIS — H903 Sensorineural hearing loss, bilateral: Secondary | ICD-10-CM | POA: Diagnosis present

## 2021-09-12 DIAGNOSIS — M549 Dorsalgia, unspecified: Secondary | ICD-10-CM | POA: Diagnosis present

## 2021-09-12 DIAGNOSIS — M1990 Unspecified osteoarthritis, unspecified site: Secondary | ICD-10-CM | POA: Diagnosis present

## 2021-09-12 DIAGNOSIS — I10 Essential (primary) hypertension: Secondary | ICD-10-CM | POA: Diagnosis present

## 2021-09-12 DIAGNOSIS — Z90710 Acquired absence of both cervix and uterus: Secondary | ICD-10-CM

## 2021-09-12 LAB — BASIC METABOLIC PANEL
Anion gap: 8 (ref 5–15)
BUN: 10 mg/dL (ref 8–23)
CO2: 24 mmol/L (ref 22–32)
Calcium: 8.9 mg/dL (ref 8.9–10.3)
Chloride: 92 mmol/L — ABNORMAL LOW (ref 98–111)
Creatinine, Ser: 0.54 mg/dL (ref 0.44–1.00)
GFR, Estimated: >60 mL/min (ref >60)
Glucose, Bld: 97 mg/dL (ref 70–99)
Potassium: 3.9 mmol/L (ref 3.5–5.1)
Sodium: 124 mmol/L — ABNORMAL LOW (ref 135–145)

## 2021-09-12 LAB — CBC
HCT: 31 % — ABNORMAL LOW (ref 36.0–46.0)
Hemoglobin: 10.7 g/dL — ABNORMAL LOW (ref 12.0–15.0)
MCH: 30.2 pg (ref 26.0–34.0)
MCHC: 34.5 g/dL (ref 30.0–36.0)
MCV: 87.6 fL (ref 80.0–100.0)
Platelets: 204 10*3/uL (ref 150–400)
RBC: 3.54 MIL/uL — ABNORMAL LOW (ref 3.87–5.11)
RDW: 12.1 % (ref 11.5–15.5)
WBC: 3.9 10*3/uL — ABNORMAL LOW (ref 4.0–10.5)
nRBC: 0 % (ref 0.0–0.2)

## 2021-09-12 LAB — HEPATIC FUNCTION PANEL
ALT: 14 U/L (ref 0–44)
AST: 19 U/L (ref 15–41)
Albumin: 3.7 g/dL (ref 3.5–5.0)
Alkaline Phosphatase: 74 U/L (ref 38–126)
Bilirubin, Direct: 0.1 mg/dL (ref 0.0–0.2)
Indirect Bilirubin: 0.7 mg/dL (ref 0.3–0.9)
Total Bilirubin: 0.8 mg/dL (ref 0.3–1.2)
Total Protein: 6.6 g/dL (ref 6.5–8.1)

## 2021-09-12 LAB — RESP PANEL BY RT-PCR (FLU A&B, COVID) ARPGX2
Influenza A by PCR: NEGATIVE
Influenza B by PCR: NEGATIVE
SARS Coronavirus 2 by RT PCR: POSITIVE — AB

## 2021-09-12 LAB — TROPONIN I (HIGH SENSITIVITY): Troponin I (High Sensitivity): 4 ng/L (ref <18)

## 2021-09-12 LAB — LIPASE, BLOOD: Lipase: 23 U/L (ref 11–51)

## 2021-09-12 MED ORDER — SODIUM CHLORIDE 0.9 % IV SOLN
200.0000 mg | Freq: Once | INTRAVENOUS | Status: AC
  Administered 2021-09-13: 200 mg via INTRAVENOUS
  Filled 2021-09-12: qty 200

## 2021-09-12 MED ORDER — SODIUM CHLORIDE 0.9 % IV BOLUS
1000.0000 mL | Freq: Once | INTRAVENOUS | Status: AC
  Administered 2021-09-13: 1000 mL via INTRAVENOUS

## 2021-09-12 MED ORDER — SODIUM CHLORIDE 0.9 % IV SOLN
100.0000 mg | Freq: Every day | INTRAVENOUS | Status: DC
  Administered 2021-09-13 – 2021-09-14 (×2): 100 mg via INTRAVENOUS
  Filled 2021-09-12: qty 20
  Filled 2021-09-12 (×2): qty 100

## 2021-09-12 MED ORDER — ACETAMINOPHEN 325 MG PO TABS
650.0000 mg | ORAL_TABLET | Freq: Once | ORAL | Status: AC
  Administered 2021-09-13: 650 mg via ORAL
  Filled 2021-09-12: qty 2

## 2021-09-12 MED ORDER — PREDNISONE 50 MG PO TABS: 50.0000 mg | ORAL_TABLET | Freq: Every day | ORAL | Status: DC

## 2021-09-12 MED ORDER — METHYLPREDNISOLONE SODIUM SUCC 125 MG IJ SOLR
1.0000 mg/kg | Freq: Two times a day (BID) | INTRAMUSCULAR | Status: DC
  Administered 2021-09-13: 11:00:00 89.375 mg via INTRAVENOUS
  Filled 2021-09-12: qty 2

## 2021-09-12 NOTE — ED Provider Notes (Signed)
[facility] Emergency Department Provider Note   ____________________________________________   Event Date/Time   First MD Initiated Contact with Patient 09/12/21 2303     (approximate)  I have reviewed the triage vital signs and the nursing notes.   HISTORY  Chief Complaint Headache and Nausea    HPI Lori Trevino is a 69 y.o. female brought to the ED via EMS from home with a chief complaint of generalized weakness, malaise, fever/chills, headache and nausea.  Symptoms x1 day.  Also endorses dry cough.  Denies chest pain, shortness of breath, abdominal pain, vomiting or diarrhea.  Patient received initial COVID-19 vaccinations but has not yet received her booster.     Past Medical History:  Diagnosis Date  . Allergy   . Hypertension   . Radiculopathy   . Tendonitis 11/2019   right wrist and right arm    Patient Active Problem List   Diagnosis Date Noted  . COVID-19 virus infection 09/13/2021  . Hyponatremia, acute on chronic 09/13/2021  . Generalized weakness 09/13/2021  . Leg pain 12/28/2020  . Raynaud'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 12/17/2020  . Arterial insufficiency of lower extremity ([place]) 12/17/2020  . Pedal edema 02/26/2020  . Sensorineural hearing loss (SNHL) of both ears 09/17/2019  . Essential hypertension 08/23/2019  . Narcotic addiction ([place]) 08/23/2019  . Psychophysiological insomnia 08/23/2019  . Tinnitus of both ears 08/23/2019  . Elevated glucose level 02/05/2018  . Gait instability 10/27/2017  . Environmental and seasonal allergies 02/14/2017  . Anxiety about health 12/10/2015  . Mixed hyperlipidemia 12/08/2015  . 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 Chronic pain 05/02/2015  . Osteoarthritis 06/16/2014  . Radiculopathy of lumbar region 06/16/2014  . COPD with chronic bronchitis ([place]) 06/12/2014  . 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[place]) 06/12/2014  . Chronic insomnia 06/12/2014   . Vitamin D deficiency 05/31/2014  . Neuralgia and neuritis 06/25/2013  . Arthritis, midfoot 06/18/2013    Past Surgical History:  Procedure Laterality Date  . AXILLARY SURGERY Right 2008  . CHOLECYSTECTOMY    . CYSTOCELE REPAIR N/A 02/08/2016   Procedure: ANTERIOR REPAIR (CYSTOCELE);  Surgeon: Boykin Nearing, MD;  Location: [facility] ORS;  Service: Gynecology;  Laterality: N/A;  . LAPAROSCOPIC GASTRIC SLEEVE RESECTION WITH HIATAL HERNIA REPAIR  2015  . TONSILLECTOMY    . TUBAL LIGATION    . VAGINAL HYSTERECTOMY N/A 02/08/2016   Procedure: HYSTERECTOMY VAGINAL WITH BSO;  Surgeon: Boykin Nearing, MD;  Location: [facility] ORS;  Service: Gynecology;  Laterality: N/A;  . VARICOSE VEIN SURGERY      Prior to Admission medications   Medication Sig Start Date End Date Taking? Authorizing Provider  B Complex-Biotin-FA (B-COMPLEX PO) Take 1 tablet by mouth daily.    [provider]  busPIRone (BUSPAR) 15 MG tablet Take 15 mg by mouth 3 (three) times daily. 02/17/20   [provider]  cyclobenzaprine (FLEXERIL) 10 MG tablet TAKE 1/2-1 TABLET BY MOUTH 3 TIMES A DAY AS NEEDED 12/10/20   [provider]  fluticasone (FLONASE) 50 MCG/ACT nasal spray PLACE 1 SPRAY INTO BOTH NOSTRILS DAILY. 10/30/20   Glean Hess, MD  furosemide (LASIX) 20 MG tablet Take 1 tablet (20 mg total) by mouth daily as needed. 08/25/20   Glean Hess, MD  hydrochlorothiazide (HYDRODIURIL) 25 MG tablet TAKE 1 TABLET BY MOUTH EVERY DAY 05/13/21   Glean Hess, MD  loratadine (CLARITIN) 10 MG tablet Take 10 mg by mouth daily  as needed for allergies.    [provider]  methadone (DOLOPHINE) 1 mg/ml oral solution Take by mouth daily. 100 mg    [provider]  Multiple Vitamin (ONE-A-DAY 55 PLUS PO) Take 1 tablet by mouth daily.    [provider]  olmesartan (BENICAR) 20 MG tablet TAKE 1 TABLET BY MOUTH EVERY DAY 04/27/21   Glean Hess, MD  ondansetron  (ZOFRAN-ODT) 8 MG disintegrating tablet TAKE 1 TABLET (8 MG TOTAL) BY MOUTH EVERY 8 (EIGHT) HOURS AS NEEDED FOR NAUSEA OR VOMITING. 02/18/21   Glean Hess, MD  amLODipine (NORVASC) 5 MG tablet TAKE 1 TABLET (5 MG TOTAL) BY MOUTH DAILY. FOR HIGH BLOOD PRESSURE 09/09/19 12/18/19  Glean Hess, MD    Allergies Neurontin [gabapentin] and Zolpidem  Family History  Problem Relation Age of Onset  . Stroke Mother   . Cancer Father   . Breast cancer Neg Hx     Social History Social History   Tobacco Use  . Smoking status: Every Day    Packs/day: 0.10    Years: 60.00    Pack years: 6.00    Types: Cigarettes  . Smokeless tobacco: Never  . Tobacco comments:    2-3 daily  Vaping Use  . Vaping Use: Never used  Substance Use Topics  . Alcohol use: Not Currently    Alcohol/week: 0.0 standard drinks  . Drug use: No    Review of Systems  Constitutional: Positive for fever/chills, malaise, generalized weakness Eyes: No visual changes. ENT: No sore throat. Cardiovascular: Denies chest pain. Respiratory: Positive for cough.  Denies shortness of breath. Gastrointestinal: No abdominal pain.  Positive for nausea, no vomiting.  No diarrhea.  No constipation. Genitourinary: Negative for dysuria. Musculoskeletal: Negative for back pain. Skin: Negative for rash. Neurological: Positive for headache. Negative for focal weakness or numbness.   ____________________________________________   PHYSICAL EXAM:  VITAL SIGNS: ED Triage Vitals  Enc Vitals Group     BP 09/12/21 1950 117/79     Pulse Rate 09/12/21 1950 87     Resp 09/12/21 1950 20     Temp 09/12/21 1950 98.6 F (37 C)     Temp Source 09/12/21 1950 Oral     SpO2 09/12/21 1950 97 %     Weight --      Height --      Head Circumference --      Peak Flow --      Pain Score 09/12/21 2005 0     Pain Loc --      Pain Edu? --      Excl. in [place]? --     Constitutional: Alert and oriented.  Elderly appearing and in mild  acute distress. Eyes: Conjunctivae are normal. PERRL. EOMI. Head: Atraumatic. Nose: Congestion/rhinnorhea. Mouth/Throat: Mucous membranes are mildly dry. Neck: No stridor.  Supple neck without meningismus. Cardiovascular: Normal rate, regular rhythm. Grossly normal heart sounds.  Good peripheral circulation. Respiratory: Normal respiratory effort.  No retractions. Lungs CTAB.  Loose cough noted. Gastrointestinal: Soft and nontender to light or deep palpation. No distention. No abdominal bruits. No CVA tenderness. Musculoskeletal: No lower extremity tenderness nor edema.  No joint effusions. Neurologic: Alert and oriented x3.  CN II to XII grossly intact.  Normal speech and language. No gross focal neurologic deficits are appreciated.  Skin:  Skin is warm, dry and intact. No rash noted.  No petechiae. Psychiatric: Mood and affect are normal. Speech and behavior are normal.  ____________________________________________  LABS (all labs ordered are listed, but only abnormal results are displayed)  Labs Reviewed  RESP PANEL BY RT-PCR (FLU A&B, COVID) ARPGX2 - Abnormal; Notable for the following components:      Result Value   SARS Coronavirus 2 by RT PCR POSITIVE (*)    All other components within normal limits  CBC - Abnormal; Notable for the following components:   WBC 3.9 (*)    RBC 3.54 (*)    Hemoglobin 10.7 (*)    HCT 31.0 (*)    All other components within normal limits  BASIC METABOLIC PANEL - Abnormal; Notable for the following components:   Sodium 124 (*)    Chloride 92 (*)    All other components within normal limits  HEPATIC FUNCTION PANEL  LIPASE, BLOOD  BRAIN NATRIURETIC PEPTIDE  C-REACTIVE PROTEIN  D-DIMER, QUANTITATIVE  FERRITIN  FIBRINOGEN  HEPATITIS B SURFACE ANTIGEN  LACTATE DEHYDROGENASE  PROCALCITONIN  ABO/RH  TYPE AND SCREEN  TROPONIN I (HIGH SENSITIVITY)   ____________________________________________  EKG  ED ECG REPORT I, Lavona Norsworthy J, the  attending physician, personally viewed and interpreted this ECG.   Date: 09/12/2021  EKG Time: 0101  Rate: 75  Rhythm: normal sinus rhythm  Axis: Normal  Intervals:nonspecific intraventricular conduction delay  ST&T Change: Nonspecific  ____________________________________________  RADIOLOGY I, Magdelene Ruark J, personally viewed and evaluated these images (plain radiographs) as part of my medical decision making, as well as reviewing the written report by the radiologist.  ED MD interpretation: No acute cardiopulmonary process  Official radiology report(s): DG Chest Port 1 View  Result Date: 09/12/2021 CLINICAL DATA:  Flu like symptoms, fever EXAM: PORTABLE CHEST 1 VIEW COMPARISON:  06/12/2014 FINDINGS: Lungs are clear.  No pleural effusion or pneumothorax. The heart is normal in size. IMPRESSION: No evidence of acute cardiopulmonary disease. Electronically Signed   By: Julian Hy M.D.   On: 09/12/2021 23:23    ____________________________________________   PROCEDURES  Procedure(s) performed (including Critical Care):  .1-3 Lead EKG Interpretation Performed by: Paulette Blanch, MD Authorized by: Paulette Blanch, MD     Interpretation: normal     ECG rate:  88   ECG rate assessment: normal     Rhythm: sinus rhythm     Ectopy: none     Conduction: normal   Comments:     Placed on cardiac monitor to evaluate for arrhythmias   ____________________________________________   INITIAL IMPRESSION / ASSESSMENT AND PLAN / ED COURSE  As part of my medical decision making, I reviewed the following data within the [place] notes reviewed and incorporated, Labs reviewed, EKG interpreted, Old chart reviewed, Radiograph reviewed, Discussed with admitting physician, and Notes from prior ED visits     70 year old female presenting with generalized weakness, malaise, fever/chills, cough, nausea and headache.  Differential diagnosis includes but is not limited  to viral process such as COVID-19, influenza, community-acquired pneumonia, UTI, ACS, metabolic derangement, etc.  Laboratory results demonstrate leukopenia, hyponatremia which is new from prior although patient does have a history of hyponatremia; she is COVID+. Will initiate IV fluids, Solumedrol and Remdisivir. Will discuss with hospitalists service for admission.  Clinical Course as of 09/13/21 0531  Mon Sep 13, 2021  0529 Continues to complain of chronic back pain made worse by ED gurney. States she take daily Methadone. She has had 2 doses of IV Morphine and remains uncomfortable. Comfort measures applied; will administer Oxycodone. [JS]    Clinical Course User Index [JS] Beather Arbour,  Gretchen Short, MD     ____________________________________________   FINAL CLINICAL IMPRESSION(S) / ED DIAGNOSES  Final diagnoses:  COVID-19  Weakness generalized  Hyponatremia     ED Discharge Orders     None        Note:  This document was prepared using Dragon voice recognition software and may include unintentional dictation errors.    Paulette Blanch, MD 09/13/21 [phone]

## 2021-09-12 NOTE — Progress Notes (Signed)
Remdesivir - Pharmacy Brief Note   O:  ALT: 14  CXR:  SpO2:95 % on RA    A/P:  Remdesivir 200 mg IVPB once followed by 100 mg IVPB daily x 4 days.   Lori Trevino D 09/12/2021 11:39 PM

## 2021-09-12 NOTE — ED Notes (Signed)
Per ems pt with flu like symptoms for one day. Pe rems pt with congestion, chills, headache, vitals 95% ra, 91 hr 118/65, 99.0 temp.

## 2021-09-12 NOTE — ED Triage Notes (Signed)
Pt BIB EMS due to not "feeling well." Per pt, she she feels nauseous and has a headache. Pt unable to tell me when her symptoms started.

## 2021-09-12 NOTE — ED Notes (Addendum)
Pt presents to ER for dizziness, nausea, headache, coughing, and now having mild pain that is reported as "congestion like feeling." Pt not feeling well all day. Pt reports boyfriend is sick and has similar symptoms- unsure of his dx.

## 2021-09-13 ENCOUNTER — Encounter: Payer: Self-pay | Admitting: Internal Medicine

## 2021-09-13 DIAGNOSIS — F17210 Nicotine dependence, cigarettes, uncomplicated: Secondary | ICD-10-CM | POA: Diagnosis present

## 2021-09-13 DIAGNOSIS — J449 Chronic obstructive pulmonary disease, unspecified: Secondary | ICD-10-CM | POA: Diagnosis present

## 2021-09-13 DIAGNOSIS — Z79899 Other long term (current) drug therapy: Secondary | ICD-10-CM | POA: Diagnosis not present

## 2021-09-13 DIAGNOSIS — E871 Hypo-osmolality and hyponatremia: Secondary | ICD-10-CM

## 2021-09-13 DIAGNOSIS — I10 Essential (primary) hypertension: Secondary | ICD-10-CM | POA: Diagnosis present

## 2021-09-13 DIAGNOSIS — R531 Weakness: Secondary | ICD-10-CM

## 2021-09-13 DIAGNOSIS — G8929 Other chronic pain: Secondary | ICD-10-CM | POA: Diagnosis present

## 2021-09-13 DIAGNOSIS — E782 Mixed hyperlipidemia: Secondary | ICD-10-CM | POA: Diagnosis present

## 2021-09-13 DIAGNOSIS — Z90710 Acquired absence of both cervix and uterus: Secondary | ICD-10-CM | POA: Diagnosis not present

## 2021-09-13 DIAGNOSIS — H903 Sensorineural hearing loss, bilateral: Secondary | ICD-10-CM | POA: Diagnosis present

## 2021-09-13 DIAGNOSIS — M1990 Unspecified osteoarthritis, unspecified site: Secondary | ICD-10-CM | POA: Diagnosis present

## 2021-09-13 DIAGNOSIS — Z888 Allergy status to other drugs, medicaments and biological substances status: Secondary | ICD-10-CM | POA: Diagnosis not present

## 2021-09-13 DIAGNOSIS — F1190 Opioid use, unspecified, uncomplicated: Secondary | ICD-10-CM | POA: Diagnosis present

## 2021-09-13 DIAGNOSIS — Z9884 Bariatric surgery status: Secondary | ICD-10-CM | POA: Diagnosis not present

## 2021-09-13 DIAGNOSIS — I7300 Raynaud's syndrome without gangrene: Secondary | ICD-10-CM | POA: Diagnosis present

## 2021-09-13 DIAGNOSIS — U071 COVID-19: Secondary | ICD-10-CM | POA: Diagnosis present

## 2021-09-13 DIAGNOSIS — Z9049 Acquired absence of other specified parts of digestive tract: Secondary | ICD-10-CM | POA: Diagnosis not present

## 2021-09-13 HISTORY — DX: Hypo-osmolality and hyponatremia: E87.1

## 2021-09-13 LAB — BASIC METABOLIC PANEL
Anion gap: 6 (ref 5–15)
BUN: 11 mg/dL (ref 8–23)
CO2: 25 mmol/L (ref 22–32)
Calcium: 8.6 mg/dL — ABNORMAL LOW (ref 8.9–10.3)
Chloride: 95 mmol/L — ABNORMAL LOW (ref 98–111)
Creatinine, Ser: 0.48 mg/dL (ref 0.44–1.00)
GFR, Estimated: >60 mL/min (ref >60)
Glucose, Bld: 107 mg/dL — ABNORMAL HIGH (ref 70–99)
Potassium: 3.7 mmol/L (ref 3.5–5.1)
Sodium: 126 mmol/L — ABNORMAL LOW (ref 135–145)

## 2021-09-13 LAB — D-DIMER, QUANTITATIVE: D-Dimer, Quant: 0.41 ug/mL-FEU (ref 0.00–0.50)

## 2021-09-13 LAB — CREATININE, SERUM
Creatinine, Ser: 0.64 mg/dL (ref 0.44–1.00)
GFR, Estimated: >60 mL/min (ref >60)

## 2021-09-13 LAB — CBC
HCT: 32.3 % — ABNORMAL LOW (ref 36.0–46.0)
HCT: 31.7 % — ABNORMAL LOW (ref 36.0–46.0)
Hemoglobin: 11.1 g/dL — ABNORMAL LOW (ref 12.0–15.0)
Hemoglobin: 11 g/dL — ABNORMAL LOW (ref 12.0–15.0)
MCH: 29.8 pg (ref 26.0–34.0)
MCH: 30.1 pg (ref 26.0–34.0)
MCHC: 34.4 g/dL (ref 30.0–36.0)
MCHC: 34.7 g/dL (ref 30.0–36.0)
MCV: 86.8 fL (ref 80.0–100.0)
MCV: 86.6 fL (ref 80.0–100.0)
Platelets: 196 10*3/uL (ref 150–400)
Platelets: 183 10*3/uL (ref 150–400)
RBC: 3.72 MIL/uL — ABNORMAL LOW (ref 3.87–5.11)
RBC: 3.66 MIL/uL — ABNORMAL LOW (ref 3.87–5.11)
RDW: 12.4 % (ref 11.5–15.5)
RDW: 12.1 % (ref 11.5–15.5)
WBC: 2.9 10*3/uL — ABNORMAL LOW (ref 4.0–10.5)
WBC: 2.7 10*3/uL — ABNORMAL LOW (ref 4.0–10.5)
nRBC: 0 % (ref 0.0–0.2)
nRBC: 0 % (ref 0.0–0.2)

## 2021-09-13 LAB — VITAMIN B12: Vitamin B-12: 580 pg/mL (ref 180–914)

## 2021-09-13 LAB — TYPE AND SCREEN
ABO/RH(D): A POS
Antibody Screen: NEGATIVE

## 2021-09-13 LAB — VITAMIN D 25 HYDROXY (VIT D DEFICIENCY, FRACTURES): Vit D, 25-Hydroxy: 32.01 ng/mL (ref 30–100)

## 2021-09-13 LAB — FOLATE: Folate: 35 ng/mL (ref >5.9)

## 2021-09-13 LAB — C-REACTIVE PROTEIN: CRP: 0.6 mg/dL (ref <1.0)

## 2021-09-13 LAB — PROCALCITONIN: Procalcitonin: <0.1 ng/mL

## 2021-09-13 LAB — FIBRINOGEN: Fibrinogen: 343 mg/dL (ref 210–475)

## 2021-09-13 LAB — HEPATITIS B SURFACE ANTIGEN: Hepatitis B Surface Ag: NONREACTIVE

## 2021-09-13 LAB — PHOSPHORUS: Phosphorus: 3.1 mg/dL (ref 2.5–4.6)

## 2021-09-13 LAB — IRON AND TIBC
Iron: 28 ug/dL (ref 28–170)
Saturation Ratios: 9 % — ABNORMAL LOW (ref 10.4–31.8)
TIBC: 308 ug/dL (ref 250–450)
UIBC: 280 ug/dL

## 2021-09-13 LAB — CK: Total CK: 75 U/L (ref 38–234)

## 2021-09-13 LAB — OSMOLALITY: Osmolality: 266 mOsm/kg — ABNORMAL LOW (ref 275–295)

## 2021-09-13 LAB — TSH: TSH: 0.516 u[IU]/mL (ref 0.350–4.500)

## 2021-09-13 LAB — MAGNESIUM: Magnesium: 1.8 mg/dL (ref 1.7–2.4)

## 2021-09-13 LAB — LACTATE DEHYDROGENASE: LDH: 136 U/L (ref 98–192)

## 2021-09-13 LAB — FERRITIN: Ferritin: 25 ng/mL (ref 11–307)

## 2021-09-13 LAB — BRAIN NATRIURETIC PEPTIDE: B Natriuretic Peptide: 66 pg/mL (ref 0.0–100.0)

## 2021-09-13 MED ORDER — GUAIFENESIN 100 MG/5ML PO LIQD: 10.0000 mL | Freq: Four times a day (QID) | ORAL | Status: DC | PRN

## 2021-09-13 MED ORDER — HYDROCODONE BIT-HOMATROP MBR 5-1.5 MG/5ML PO SOLN
5.0000 mL | Freq: Four times a day (QID) | ORAL | Status: DC | PRN
  Administered 2021-09-14 (×2): 5 mL via ORAL
  Filled 2021-09-13 (×2): qty 5

## 2021-09-13 MED ORDER — ACETAMINOPHEN 650 MG RE SUPP: 650.0000 mg | Freq: Four times a day (QID) | RECTAL | Status: DC | PRN

## 2021-09-13 MED ORDER — OXYCODONE HCL 5 MG PO TABS
10.0000 mg | ORAL_TABLET | Freq: Once | ORAL | Status: AC
  Administered 2021-09-13: 10 mg via ORAL
  Filled 2021-09-13: qty 2

## 2021-09-13 MED ORDER — LORATADINE 10 MG PO TABS
10.0000 mg | ORAL_TABLET | Freq: Every day | ORAL | Status: DC
  Administered 2021-09-13 – 2021-09-15 (×3): 10 mg via ORAL
  Filled 2021-09-13 (×3): qty 1

## 2021-09-13 MED ORDER — PANTOPRAZOLE SODIUM 40 MG PO TBEC
40.0000 mg | DELAYED_RELEASE_TABLET | Freq: Two times a day (BID) | ORAL | Status: DC
  Administered 2021-09-13 – 2021-09-15 (×5): 40 mg via ORAL
  Filled 2021-09-13 (×5): qty 1

## 2021-09-13 MED ORDER — MORPHINE SULFATE (PF) 4 MG/ML IV SOLN
4.0000 mg | Freq: Once | INTRAVENOUS | Status: AC
  Administered 2021-09-13: 4 mg via INTRAVENOUS
  Filled 2021-09-13: qty 1

## 2021-09-13 MED ORDER — ONDANSETRON HCL 4 MG PO TABS: 4.0000 mg | ORAL_TABLET | Freq: Four times a day (QID) | ORAL | Status: DC | PRN

## 2021-09-13 MED ORDER — ONDANSETRON HCL 4 MG/2ML IJ SOLN
4.0000 mg | Freq: Once | INTRAMUSCULAR | Status: AC
  Administered 2021-09-13: 4 mg via INTRAVENOUS
  Filled 2021-09-13: qty 2

## 2021-09-13 MED ORDER — DEXAMETHASONE 4 MG PO TABS
6.0000 mg | ORAL_TABLET | Freq: Every day | ORAL | Status: DC
  Administered 2021-09-14 – 2021-09-15 (×2): 6 mg via ORAL
  Filled 2021-09-13 (×2): qty 2

## 2021-09-13 MED ORDER — IRBESARTAN 75 MG PO TABS
37.5000 mg | ORAL_TABLET | Freq: Every day | ORAL | Status: DC
  Administered 2021-09-13 – 2021-09-15 (×3): 37.5 mg via ORAL
  Filled 2021-09-13 (×3): qty 0.5

## 2021-09-13 MED ORDER — ACETAMINOPHEN 325 MG PO TABS
650.0000 mg | ORAL_TABLET | Freq: Four times a day (QID) | ORAL | Status: DC | PRN
  Administered 2021-09-14: 650 mg via ORAL
  Filled 2021-09-13: qty 2

## 2021-09-13 MED ORDER — METHADONE HCL 10 MG/ML PO CONC
100.0000 mg | Freq: Every day | ORAL | Status: DC
  Administered 2021-09-13 – 2021-09-15 (×3): 100 mg via ORAL
  Filled 2021-09-13 (×2): qty 10

## 2021-09-13 MED ORDER — ENOXAPARIN SODIUM 60 MG/0.6ML IJ SOSY
0.5000 mg/kg | PREFILLED_SYRINGE | INTRAMUSCULAR | Status: DC
  Administered 2021-09-13 – 2021-09-15 (×3): 45 mg via SUBCUTANEOUS
  Filled 2021-09-13 (×3): qty 0.6

## 2021-09-13 MED ORDER — ONDANSETRON HCL 4 MG/2ML IJ SOLN: 4.0000 mg | Freq: Four times a day (QID) | INTRAMUSCULAR | Status: DC | PRN

## 2021-09-13 MED ORDER — HYDROCODONE-ACETAMINOPHEN 5-325 MG PO TABS
1.0000 | ORAL_TABLET | ORAL | Status: DC | PRN
  Administered 2021-09-13 – 2021-09-15 (×5): 2 via ORAL
  Filled 2021-09-13 (×5): qty 2

## 2021-09-13 MED ORDER — ALBUTEROL SULFATE HFA 108 (90 BASE) MCG/ACT IN AERS
2.0000 | INHALATION_SPRAY | RESPIRATORY_TRACT | Status: DC | PRN
  Filled 2021-09-13: qty 6.7

## 2021-09-13 NOTE — ED Notes (Signed)
Messaged pharmacy re: pending verification for Solumedrol due to need for patient due to chest pain.

## 2021-09-13 NOTE — H&P (Signed)
History and Physical    Lori Trevino GGY:[id number] DOB: 10-02-1951 DOA: 09/12/2021  PCP: Glean Hess, MD   Patient coming from: home  I have personally briefly reviewed patient's relevant medical records in [place]  Chief Complaint: Flulike symptoms  HPI: Lori Trevino is a 70 y.o. female with medical history significant for HTN, chronic pain, who presents to the ED with a 2-day history of congestion, chills, muscle aches, nausea, headaches and generalized malaise.  She has a mild cough but denies shortness of breath or chest pain.  Denies vomiting, abdominal pain or diarrhea  ED course: Vitals within normal limits Blood work significant for sodium 124, baseline around 129, WBC 3.9, hemoglobin 10.7, lipase and LFTs WNL, troponin 4 COVID-positive, influenza negative  Chest x-ray nonacute  Remdesivir ordered.  Hospitalist consulted for admission.   Review of Systems: As per HPI otherwise all other systems on review of systems negative.   Assessment/Plan    COVID-19 virus infection   Generalized weakness - Continue remdesivir - Symptomatic treatment - Airborne precautions    Hyponatremia, acute on chronic - Hold home HCTZ, Lasix - Received an IV NS bolus in the ED - Continue to monitor    COPD with chronic bronchitis ([place]) - As needed albuterol    Essential hypertension - Continue home olmesartan.  Consider discontinuing HCTZ  Chronic pain - Continue methadone pending verification as well as cyclobenzaprine   DVT prophylaxis: Lovenox  Code Status: full code  Family Communication:  none  Disposition Plan: Back to previous home environment Consults called: none  Status:At the time of admission, it appears that the appropriate admission status for this patient is INPATIENT. This is judged to be reasonable and necessary in order to provide the required intensity of service to ensure the patient's safety given the presenting symptoms, physical exam findings,  and initial radiographic and laboratory data in the context of their  Comorbid conditions.   Patient requires inpatient status due to high intensity of service, high risk for further deterioration and high frequency of surveillance required.   I certify that at the point of admission it is my clinical judgment that the patient will require inpatient hospital care spanning beyond 2 midnights     Physical Exam: Vitals:   09/12/21 1950 09/12/21 2151 09/12/21 2321  BP: 117/79 110/73 116/76  Pulse: 87 88 87  Resp: 20 20 (!) 24  Temp: 98.6 F (37 C) 98.3 F (36.8 C) 98.7 F (37.1 C)  TempSrc: Oral Oral   SpO2: 97% 97% 95%   Constitutional: Alert, oriented x 3 .  Mildly ill-appearing HEENT:      Head: Normocephalic and atraumatic.         Eyes: PERLA, EOMI, Conjunctivae are normal. Sclera is non-icteric.       Mouth/Throat: Mucous membranes are moist.       Neck: Supple with no signs of meningismus. Cardiovascular: Regular rate and rhythm. No murmurs, gallops, or rubs. 2+ symmetrical distal pulses are present . No JVD. No  LE edema Respiratory: Respiratory effort normal .Lungs sounds clear bilaterally. No wheezes, crackles, or rhonchi.  Gastrointestinal: Soft, non tender, non distended. Positive bowel sounds.  Genitourinary: No CVA tenderness. Musculoskeletal: Nontender with normal range of motion in all extremities. No cyanosis, or erythema of extremities. Neurologic:  Face is symmetric. Moving all extremities. No gross focal neurologic deficits . Skin: Skin is warm, dry.  No rash or ulcers Psychiatric: Mood and affect are appropriate     Past  Medical History:  Diagnosis Date   Allergy    Hypertension    Radiculopathy    Tendonitis 11/2019   right wrist and right arm    Past Surgical History:  Procedure Laterality Date   AXILLARY SURGERY Right 2008   CHOLECYSTECTOMY     CYSTOCELE REPAIR N/A 02/08/2016   Procedure: ANTERIOR REPAIR (CYSTOCELE);  Surgeon: Boykin Nearing,  MD;  Location: [facility] ORS;  Service: Gynecology;  Laterality: N/A;   LAPAROSCOPIC GASTRIC SLEEVE RESECTION WITH HIATAL HERNIA REPAIR  2015   TONSILLECTOMY     TUBAL LIGATION     VAGINAL HYSTERECTOMY N/A 02/08/2016   Procedure: HYSTERECTOMY VAGINAL WITH BSO;  Surgeon: Boykin Nearing, MD;  Location: [facility] ORS;  Service: Gynecology;  Laterality: N/A;   VARICOSE VEIN SURGERY       reports that she has been smoking cigarettes. She has a 6.00 pack-year smoking history. She has never used smokeless tobacco. She reports that she does not currently use alcohol. She reports that she does not use drugs.  Allergies  Allergen Reactions   Neurontin [Gabapentin] Swelling   Zolpidem Other (See Comments)    sleepwalking     Family History  Problem Relation Age of Onset   Stroke Mother    Cancer Father    Breast cancer Neg Hx       Prior to Admission medications   Medication Sig Start Date End Date Taking? Authorizing Provider  B Complex-Biotin-FA (B-COMPLEX PO) Take 1 tablet by mouth daily.    [provider]  busPIRone (BUSPAR) 15 MG tablet Take 15 mg by mouth 3 (three) times daily. 02/17/20   [provider]  cyclobenzaprine (FLEXERIL) 10 MG tablet TAKE 1/2-1 TABLET BY MOUTH 3 TIMES A DAY AS NEEDED 12/10/20   [provider]  fluticasone (FLONASE) 50 MCG/ACT nasal spray PLACE 1 SPRAY INTO BOTH NOSTRILS DAILY. 10/30/20   Glean Hess, MD  furosemide (LASIX) 20 MG tablet Take 1 tablet (20 mg total) by mouth daily as needed. 08/25/20   Glean Hess, MD  hydrochlorothiazide (HYDRODIURIL) 25 MG tablet TAKE 1 TABLET BY MOUTH EVERY DAY 05/13/21   Glean Hess, MD  loratadine (CLARITIN) 10 MG tablet Take 10 mg by mouth daily as needed for allergies.    [provider]  methadone (DOLOPHINE) 1 mg/ml oral solution Take by mouth daily. 100 mg    [provider]  Multiple Vitamin (ONE-A-DAY 55 PLUS PO) Take 1 tablet by mouth daily.    [provider]  olmesartan (BENICAR) 20 MG tablet TAKE 1 TABLET BY MOUTH EVERY DAY 04/27/21   Glean Hess, MD  ondansetron (ZOFRAN-ODT) 8 MG disintegrating tablet TAKE 1 TABLET (8 MG TOTAL) BY MOUTH EVERY 8 (EIGHT) HOURS AS NEEDED FOR NAUSEA OR VOMITING. 02/18/21   Glean Hess, MD  amLODipine (NORVASC) 5 MG tablet TAKE 1 TABLET (5 MG TOTAL) BY MOUTH DAILY. FOR HIGH BLOOD PRESSURE 09/09/19 12/18/19  Glean Hess, MD      Labs on Admission: I have personally reviewed following labs and imaging studies  CBC: Recent Labs  Lab 09/12/21 1951  WBC 3.9*  HGB 10.7*  HCT 31.0*  MCV 87.6  PLT 381   Basic Metabolic Panel: Recent Labs  Lab 09/12/21 1951  NA 124*  K 3.9  CL 92*  CO2 24  GLUCOSE 97  BUN 10  CREATININE 0.54  CALCIUM 8.9   GFR: CrCl cannot be calculated (Unknown ideal weight.). Liver Function Tests:  Recent Labs  Lab 09/12/21 1951  AST 19  ALT 14  ALKPHOS 74  BILITOT 0.8  PROT 6.6  ALBUMIN 3.7   Recent Labs  Lab 09/12/21 1951  LIPASE 23   No results for input(s): AMMONIA in the last 168 hours. Coagulation Profile: No results for input(s): INR, PROTIME in the last 168 hours. Cardiac Enzymes: No results for input(s): CKTOTAL, CKMB, CKMBINDEX, TROPONINI in the last 168 hours. BNP (last 3 results) No results for input(s): PROBNP in the last 8760 hours. HbA1C: No results for input(s): HGBA1C in the last 72 hours. CBG: No results for input(s): GLUCAP in the last 168 hours. Lipid Profile: No results for input(s): CHOL, HDL, LDLCALC, TRIG, CHOLHDL, LDLDIRECT in the last 72 hours. Thyroid Function Tests: No results for input(s): TSH, T4TOTAL, FREET4, T3FREE, THYROIDAB in the last 72 hours. Anemia Panel: No results for input(s): VITAMINB12, FOLATE, FERRITIN, TIBC, IRON, RETICCTPCT in the last 72 hours. Urine analysis:    Component Value Date/Time   COLORURINE YELLOW (A) 10/02/2020 1055   APPEARANCEUR CLEAR (A) 10/02/2020 1055   LABSPEC 1.005  10/02/2020 1055   PHURINE 7.0 10/02/2020 1055   GLUCOSEU NEGATIVE 10/02/2020 1055   HGBUR NEGATIVE 10/02/2020 1055   [place] 10/02/2020 1055   KETONESUR NEGATIVE 10/02/2020 1055   PROTEINUR NEGATIVE 10/02/2020 1055   NITRITE NEGATIVE 10/02/2020 1055   LEUKOCYTESUR TRACE (A) 10/02/2020 1055    Radiological Exams on Admission: DG Chest Port 1 View  Result Date: 09/12/2021 CLINICAL DATA:  Flu like symptoms, fever EXAM: PORTABLE CHEST 1 VIEW COMPARISON:  06/12/2014 FINDINGS: Lungs are clear.  No pleural effusion or pneumothorax. The heart is normal in size. IMPRESSION: No evidence of acute cardiopulmonary disease. Electronically Signed   By: Julian Hy M.D.   On: 09/12/2021 23:23       Athena Masse MD [facility]   09/13/2021, 12:07 AM

## 2021-09-13 NOTE — Progress Notes (Signed)
Anticoagulation monitoring(Lovenox):  70 yo female ordered Lovenox 40 mg Q24h    Filed Weights   09/13/21 0358 09/13/21 0400  Weight: 88.9 kg (196 lb 0.4 oz) 89.1 kg (196 lb 6.4 oz)   BMI 35.9   Lab Results  Component Value Date   CREATININE 0.54 09/12/2021   CREATININE 0.48 10/02/2020   CREATININE 0.40 (L) 10/02/2020   Estimated Creatinine Clearance: 65.9 mL/min (by C-G formula based on SCr of 0.54 mg/dL). Hemoglobin & Hematocrit     Component Value Date/Time   HGB 10.7 (L) 09/12/2021 1951   HCT 31.0 (L) 09/12/2021 1951     Per Protocol for Patient with estCrcl > 30 ml/min and BMI > 30, will transition to Lovenox 45 mg Q24h.

## 2021-09-13 NOTE — ED Notes (Signed)
Pt c/o increased SOB/congestion. SPO2 approx. 95% but pt becoming dyspneic. Applied 2L/min O2 via nasal cannula to help pt breathe more comfortably. SPO2 increased to 99% and pt reported improvement in work of breathing/ SOB.

## 2021-09-13 NOTE — Plan of Care (Signed)
Patient was admitted overnight due to feeling weak and tired, found to have COVID-positive.  Hyponatremia sodium is gradually improving, osmolarity 266, hypotonic hyponatremia, started fluid restriction 1.5 L/day, continued current treatment, started methadone 100 mg for chronic back pain.,  Continue current treatment and plan for disposition in 1 to 2 days.

## 2021-09-13 NOTE — ED Notes (Signed)
Pt rocking back and forth in chair and calling out multiple times to the nursing station and yelling out of her room for help and more pain medication for her back. EDP notified and asked for medication to help with patient comfort.

## 2021-09-13 NOTE — Progress Notes (Signed)
Pt arrived to room 125 from ED with Covid.  Pt settled in bed, bed alarm on, telemetry initiated and verified.  VS stable.  Call bell and phone in reach of pt. Ayesha Mohair BSN RN CMSRN

## 2021-09-14 LAB — BASIC METABOLIC PANEL
Anion gap: 6 (ref 5–15)
BUN: 12 mg/dL (ref 8–23)
CO2: 27 mmol/L (ref 22–32)
Calcium: 8.9 mg/dL (ref 8.9–10.3)
Chloride: 96 mmol/L — ABNORMAL LOW (ref 98–111)
Creatinine, Ser: 0.57 mg/dL (ref 0.44–1.00)
GFR, Estimated: >60 mL/min (ref >60)
Glucose, Bld: 88 mg/dL (ref 70–99)
Potassium: 3.5 mmol/L (ref 3.5–5.1)
Sodium: 129 mmol/L — ABNORMAL LOW (ref 135–145)

## 2021-09-14 LAB — CBC
HCT: 33.6 % — ABNORMAL LOW (ref 36.0–46.0)
Hemoglobin: 11.5 g/dL — ABNORMAL LOW (ref 12.0–15.0)
MCH: 29.3 pg (ref 26.0–34.0)
MCHC: 34.2 g/dL (ref 30.0–36.0)
MCV: 85.7 fL (ref 80.0–100.0)
Platelets: 204 10*3/uL (ref 150–400)
RBC: 3.92 MIL/uL (ref 3.87–5.11)
RDW: 12.1 % (ref 11.5–15.5)
WBC: 2.8 10*3/uL — ABNORMAL LOW (ref 4.0–10.5)
nRBC: 0 % (ref 0.0–0.2)

## 2021-09-14 LAB — MAGNESIUM: Magnesium: 1.9 mg/dL (ref 1.7–2.4)

## 2021-09-14 LAB — PHOSPHORUS: Phosphorus: 2.7 mg/dL (ref 2.5–4.6)

## 2021-09-14 LAB — ABO/RH: ABO/RH(D): A POS

## 2021-09-14 MED ORDER — ALUM & MAG HYDROXIDE-SIMETH 200-200-20 MG/5ML PO SUSP
15.0000 mL | Freq: Four times a day (QID) | ORAL | Status: DC | PRN
  Administered 2021-09-14 – 2021-09-15 (×2): 15 mL via ORAL
  Filled 2021-09-14 (×2): qty 30

## 2021-09-14 NOTE — Progress Notes (Signed)
PROGRESS NOTE    Lori Trevino  EUM:[id number] DOB: [birth year] DOA: 09/12/2021 PCP: Glean Hess, MD   Brief Narrative: Taken from H&P. Lori Trevino is a 70 y.o. female with medical history significant for HTN, chronic pain, who presents to the ED with a 2-day history of congestion, chills, muscle aches, nausea, headaches and generalized malaise.  She has a mild cough but denies shortness of breath or chest pain.  Denies vomiting, abdominal pain or diarrhea. Found to have positive COVID-19 PCR.  Chest x-ray without any acute abnormality. Also found to have hyponatremia.  Subjective: Patient continues to feel weak and having cough.  She lives alone and was afraid of falls.  Patient was using HCTZ at home.  Assessment & Plan:   Principal Problem:   COVID-19 virus infection Active Problems:   COPD with chronic bronchitis ([place])   Essential hypertension   Hyponatremia, acute on chronic   Generalized weakness  COVID-19 viral infection.  Saturating well on room air.  CRP and D-dimer within normal limit.  She was started on remdesivir. -Completed 3 days of remdesivir -Continue with supportive care  History of COPD.  No concern of acute exacerbation at this time. -Continue with as needed bronchodilators  Hyponatremia.  Patient was on HCTZ and Lasix at home.  Sodium improving. Hyponatremia labs with hypotonic hyponatremia. -Holding home diuretics -Will discontinue HCTZ on discharge -Continue with fluid restriction  Essential hypertension.  Blood pressure within goal. -Continue home dose of olmesartan -Will discontinue HCTZ on discharge  Chronic pain. -Continue home methadone and cyclobenzaprine.  Generalized weakness.  Most likely multifactorial with recent COVID infection and hyponatremia. -PT evaluation  Objective: Vitals:   09/14/21 0729 09/14/21 1021 09/14/21 1200 09/14/21 1617  BP: 129/81 139/83 (!) 141/82 133/81  Pulse: 71  67 73  Resp: 16  17 16   Temp: 97.8 F  (36.6 C)  98.2 F (36.8 C) 97.8 F (36.6 C)  TempSrc: Oral  Oral Oral  SpO2: 100%  100% 98%  Weight:      Height:       No intake or output data in the 24 hours ending 09/14/21 1715 Filed Weights   09/13/21 0358 09/13/21 0400 09/13/21 0652  Weight: 88.9 kg 89.1 kg 87.3 kg    Examination:  General exam: Appears calm and comfortable  Respiratory system: Clear to auscultation. Respiratory effort normal. Cardiovascular system: S1 & S2 heard, RRR. Marland Kitchen Gastrointestinal system: Soft, nontender, nondistended, bowel sounds positive. Central nervous system: Alert and oriented. No focal neurological deficits.. Extremities: No edema, no cyanosis, pulses intact and symmetrical. Psychiatry: Judgement and insight appear normal. Mood & affect appropriate.    DVT prophylaxis: Lovenox Code Status: Full Family Communication:  Disposition Plan:  Status is: Inpatient  Remains inpatient appropriate because: Severity of illness   Level of care: Med-Surg  All the records are reviewed and case discussed with Care Management/Social Worker. Management plans discussed with the patient, nursing and they are in agreement.  Consultants:  None  Procedures:  Antimicrobials:   Data Reviewed: I have personally reviewed following labs and imaging studies  CBC: Recent Labs  Lab 09/12/21 1951 09/13/21 0639 09/13/21 0932 09/14/21 0632  WBC 3.9* 2.9* 2.7* 2.8*  HGB 10.7* 11.1* 11.0* 11.5*  HCT 31.0* 32.3* 31.7* 33.6*  MCV 87.6 86.8 86.6 85.7  PLT 204 196 183 540   Basic Metabolic Panel: Recent Labs  Lab 09/12/21 1951 09/13/21 0639 09/13/21 0932 09/14/21 0632  NA 124*  --  126* 129*  K  3.9  --  3.7 3.5  CL 92*  --  95* 96*  CO2 24  --  25 27  GLUCOSE 97  --  107* 88  BUN 10  --  11 12  CREATININE 0.54 0.64 0.48 0.57  CALCIUM 8.9  --  8.6* 8.9  MG  --   --  1.8 1.9  PHOS  --   --  3.1 2.7   GFR: Estimated Creatinine Clearance: 63.8 mL/min (by C-G formula based on SCr of 0.57  mg/dL). Liver Function Tests: Recent Labs  Lab 09/12/21 1951  AST 19  ALT 14  ALKPHOS 74  BILITOT 0.8  PROT 6.6  ALBUMIN 3.7   Recent Labs  Lab 09/12/21 1951  LIPASE 23   No results for input(s): AMMONIA in the last 168 hours. Coagulation Profile: No results for input(s): INR, PROTIME in the last 168 hours. Cardiac Enzymes: Recent Labs  Lab 09/13/21 0932  CKTOTAL 75   BNP (last 3 results) No results for input(s): PROBNP in the last 8760 hours. HbA1C: No results for input(s): HGBA1C in the last 72 hours. CBG: No results for input(s): GLUCAP in the last 168 hours. Lipid Profile: No results for input(s): CHOL, HDL, LDLCALC, TRIG, CHOLHDL, LDLDIRECT in the last 72 hours. Thyroid Function Tests: Recent Labs    09/13/21 0932  TSH 0.516   Anemia Panel: Recent Labs    09/13/21 0024 09/13/21 0932  VITAMINB12  --  580  FOLATE  --  35.0  FERRITIN 25  --   TIBC  --  308  IRON  --  28   Sepsis Labs: Recent Labs  Lab 09/13/21 0024  PROCALCITON <0.10    Recent Results (from the past 240 hour(s))  Resp Panel by RT-PCR (Flu A&B, Covid) Nasopharyngeal Swab     Status: Abnormal   Collection Time: 09/12/21  7:51 PM   Specimen: Nasopharyngeal Swab; Nasopharyngeal(NP) swabs in vial transport medium  Result Value Ref Range Status   SARS Coronavirus 2 by RT PCR POSITIVE (A) NEGATIVE Final    Comment: RESULT CALLED TO, READ BACK BY AND VERIFIED WITH: ASHLEY B @ 2130 10/13/20 LFD (NOTE) SARS-CoV-2 target nucleic acids are DETECTED.  The SARS-CoV-2 RNA is generally detectable in upper respiratory specimens during the acute phase of infection. Positive results are indicative of the presence of the identified virus, but do not rule out bacterial infection or co-infection with other pathogens not detected by the test. Clinical correlation with patient history and other diagnostic information is necessary to determine patient infection status. The expected result is  Negative.  Fact Sheet for Patients: EntrepreneurPulse.com.au  Fact Sheet for Healthcare Providers: IncredibleEmployment.be  This test is not yet approved or cleared by the [place] FDA and  has been authorized for detection and/or diagnosis of SARS-CoV-2 by FDA under an Emergency Use Authorization (EUA).  This EUA will remain in effect (meaning this test can be used)  for the duration of  the COVID-19 declaration under Section 564(b)(1) of the Act, 21 U.S.C. section 360bbb-3(b)(1), unless the authorization is terminated or revoked sooner.     Influenza A by PCR NEGATIVE NEGATIVE Final   Influenza B by PCR NEGATIVE NEGATIVE Final    Comment: (NOTE) The Xpert Xpress SARS-CoV-2/FLU/RSV plus assay is intended as an aid in the diagnosis of influenza from Nasopharyngeal swab specimens and should not be used as a sole basis for treatment. Nasal washings and aspirates are unacceptable for Xpert Xpress SARS-CoV-2/FLU/RSV testing.  Fact Sheet  for Patients: EntrepreneurPulse.com.au  Fact Sheet for Healthcare Providers: IncredibleEmployment.be  This test is not yet approved or cleared by the [place] FDA and has been authorized for detection and/or diagnosis of SARS-CoV-2 by FDA under an Emergency Use Authorization (EUA). This EUA will remain in effect (meaning this test can be used) for the duration of the COVID-19 declaration under Section 564(b)(1) of the Act, 21 U.S.C. section 360bbb-3(b)(1), unless the authorization is terminated or revoked.  Performed at [facility], [address] [place], [place] [address]      Radiology Studies: DG Chest [place] 1 View  Result Date: 09/12/2021 CLINICAL DATA:  Flu like symptoms, fever EXAM: PORTABLE CHEST 1 VIEW COMPARISON:  06/12/2014 FINDINGS: Lungs are clear.  No pleural effusion or pneumothorax. The heart is normal in size. IMPRESSION: No evidence  of acute cardiopulmonary disease. Electronically Signed   By: Julian Hy M.D.   On: 09/12/2021 23:23    Scheduled Meds:  dexamethasone  6 mg Oral Daily   enoxaparin (LOVENOX) injection  0.5 mg/kg Subcutaneous Q24H   irbesartan  37.5 mg Oral Daily   loratadine  10 mg Oral Daily   methadone  100 mg Oral Daily   pantoprazole  40 mg Oral BID   Continuous Infusions:  remdesivir 100 mg in NS 100 mL 100 mg (09/14/21 1006)     LOS: 1 day   Time spent: 40 minutes. More than 50% of the time was spent in counseling/coordination of care  Lorella Nimrod, MD [facility]  If 7PM-7AM, please contact night-coverage [email or web address]  09/14/2021, 5:15 PM   This record has been created using Systems analyst. Errors have been sought and corrected,but may not always be located. Such creation errors do not reflect on the standard of care.

## 2021-09-15 ENCOUNTER — Other Ambulatory Visit: Payer: Self-pay | Admitting: Internal Medicine

## 2021-09-15 DIAGNOSIS — I10 Essential (primary) hypertension: Secondary | ICD-10-CM

## 2021-09-15 LAB — BASIC METABOLIC PANEL
Anion gap: 3 — ABNORMAL LOW (ref 5–15)
BUN: 13 mg/dL (ref 8–23)
CO2: 30 mmol/L (ref 22–32)
Calcium: 8.5 mg/dL — ABNORMAL LOW (ref 8.9–10.3)
Chloride: 98 mmol/L (ref 98–111)
Creatinine, Ser: 0.53 mg/dL (ref 0.44–1.00)
GFR, Estimated: >60 mL/min (ref >60)
Glucose, Bld: 87 mg/dL (ref 70–99)
Potassium: 4 mmol/L (ref 3.5–5.1)
Sodium: 131 mmol/L — ABNORMAL LOW (ref 135–145)

## 2021-09-15 MED ORDER — PANTOPRAZOLE SODIUM 40 MG PO TBEC
40.0000 mg | DELAYED_RELEASE_TABLET | Freq: Every day | ORAL | 0 refills | Status: DC
Start: 2021-09-15 — End: 2022-11-23

## 2021-09-15 MED ORDER — GUAIFENESIN 100 MG/5ML PO LIQD: 10.0000 mL | Freq: Four times a day (QID) | ORAL | 0 refills | Status: DC | PRN

## 2021-09-15 MED ORDER — DEXAMETHASONE 6 MG PO TABS: 6.0000 mg | ORAL_TABLET | Freq: Every day | ORAL | 0 refills | Status: AC

## 2021-09-15 NOTE — TOC Initial Note (Signed)
Transition of Care [facility]) - Initial/Assessment Note    Patient Details  Name: Lori Trevino MRN: [id number] Date of Birth: 1951/08/13  Transition of Care [facility]) CM/SW Contact:    Pete Pelt, RN Phone Number: 09/15/2021, 11:20 AM  Clinical Narrative:      TOC assessment and initial note are late entries.  These were performed on 09/14/2021, entered on 12/14  Patient lives at home by herself.  Her son and boyfriend can typically support her, but they are resistant to be around her since she is COVID positive.  She is currently attempting to line up transportation home and to appointments at this time.  She states she would like quarantine clarification from MD to assist her with understanding and allowing her to secure transportation.  Patient typically does not have concerns with transportation.  She is able to take medications as directed.  She states that other than transportation, and quarantine clarification, she has no concerns about returning home at this time.  TOC to follow for needs.             Expected Discharge Plan: Home/Self Care Barriers to Discharge: Continued Medical Work up   Patient Goals and CMS Choice        Expected Discharge Plan and Services Expected Discharge Plan: Home/Self Care   Discharge Planning Services: CM Consult   Living arrangements for the past 2 months: Single Family Home                                      Prior Living Arrangements/Services Living arrangements for the past 2 months: Single Family Home Lives with:: Self Patient language and need for interpreter reviewed:: Yes Do you feel safe going back to the place where you live?: Yes      Need for Family Participation in Patient Care: Yes (Comment) Care giver support system in place?: Yes (comment)   Criminal Activity/Legal Involvement Pertinent to Current Situation/Hospitalization: No - Comment as needed  Activities of Daily Living Home Assistive Devices/Equipment: Cane  (specify quad or straight) ADL Screening (condition at time of admission) Patient's cognitive ability adequate to safely complete daily activities?: Yes Is the patient deaf or have difficulty hearing?: No Does the patient have difficulty seeing, even when wearing glasses/contacts?: No Does the patient have difficulty concentrating, remembering, or making decisions?: No Patient able to express need for assistance with ADLs?: Yes Does the patient have difficulty dressing or bathing?: No Independently performs ADLs?: Yes (appropriate for developmental age) Does the patient have difficulty walking or climbing stairs?: Yes Weakness of Legs: Both Weakness of Arms/Hands: None  Permission Sought/Granted Permission sought to share information with : Case Manager Permission granted to share information with : Yes, Verbal Permission Granted              Emotional Assessment Appearance:: Appears stated age Attitude/Demeanor/Rapport: Gracious, Engaged Affect (typically observed): Pleasant, Appropriate Orientation: : Oriented to Self, Oriented to Place, Oriented to  Time, Oriented to Situation Alcohol / Substance Use: Not Applicable Psych Involvement: No (comment)  Admission diagnosis:  Hyponatremia [E87.1] Weakness generalized [R53.1] COVID-19 virus infection [U07.1] COVID-19 [U07.1] Patient Active Problem List   Diagnosis Date Noted   COVID-19 virus infection 09/13/2021   Hyponatremia, acute on chronic 09/13/2021   Generalized weakness 09/13/2021   Leg pain 12/28/2020   Raynaud'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 12/17/2020   Arterial insufficiency of lower extremity ([place]) 12/17/2020  Pedal edema 02/26/2020   Sensorineural hearing loss (SNHL) of both ears 09/17/2019   Essential hypertension 08/23/2019   Narcotic addiction ([place]) 08/23/2019   Psychophysiological insomnia 08/23/2019   Tinnitus of both ears 08/23/2019   Elevated glucose level 02/05/2018   Gait instability 10/27/2017    Environmental and seasonal allergies 02/14/2017   Anxiety about health 12/10/2015   Mixed hyperlipidemia 12/08/2015   Body mass index (BMI) greater than 25 06/09/2015   Chronic pain 05/02/2015   Osteoarthritis 06/16/2014   Radiculopathy of lumbar region 06/16/2014   COPD with chronic bronchitis ([place]) 06/12/2014   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

## 2021-09-15 NOTE — TOC Progression Note (Incomplete)
Transition of Care [facility]) - Progression Note    Patient Details  Name: Lori Trevino MRN: [id number] Date of Birth: 04-Mar-1951  Transition of Care [facility]) CM/SW [place], RN Phone Number: 09/15/2021, 1:13 PM  Clinical Narrative:       Expected Discharge Plan: Home/Self Care Barriers to Discharge: Continued Medical Work up  Expected Discharge Plan and Services Expected Discharge Plan: Home/Self Care   Discharge Planning Services: CM Consult   Living arrangements for the past 2 months: Single Family Home Expected Discharge Date: 09/15/21                                     Social Determinants of Health (SDOH) Interventions    Readmission Risk Interventions No flowsheet data found.

## 2021-09-15 NOTE — Progress Notes (Signed)
Colonel Bald to be D/C'd Home per MD order.  Discussed prescriptions and follow up appointments with the patient. Prescriptions given to patient, medication list explained in detail. Pt verbalized understanding.  Allergies as of 09/15/2021       Reactions   Neurontin [gabapentin] Swelling   Zolpidem Other (See Comments)   sleepwalking        Medication List     STOP taking these medications    hydrochlorothiazide 25 MG tablet Commonly known as: HYDRODIURIL       TAKE these medications    B-COMPLEX PO Take 1 tablet by mouth daily.   busPIRone 15 MG tablet Commonly known as: BUSPAR Take 15 mg by mouth 3 (three) times daily.   cyclobenzaprine 10 MG tablet Commonly known as: FLEXERIL TAKE 1/2-1 TABLET BY MOUTH 3 TIMES A DAY AS NEEDED   dexamethasone 6 MG tablet Commonly known as: DECADRON Take 1 tablet (6 mg total) by mouth daily for 5 days.   fluticasone 50 MCG/ACT nasal spray Commonly known as: FLONASE PLACE 1 SPRAY INTO BOTH NOSTRILS DAILY.   furosemide 20 MG tablet Commonly known as: LASIX Take 1 tablet (20 mg total) by mouth daily as needed.   guaiFENesin 100 MG/5ML liquid Commonly known as: ROBITUSSIN Take 10 mLs by mouth every 6 (six) hours as needed for cough or to loosen phlegm.   hydrOXYzine 25 MG tablet Commonly known as: ATARAX Take 75 mg by mouth at bedtime as needed.   loratadine 10 MG tablet Commonly known as: CLARITIN Take 10 mg by mouth daily as needed for allergies.   methadone 1 mg/ml  oral solution Commonly known as: DOLOPHINE Take by mouth daily. 100 mg   olmesartan 20 MG tablet Commonly known as: BENICAR TAKE 1 TABLET BY MOUTH EVERY DAY   ondansetron 8 MG disintegrating tablet Commonly known as: ZOFRAN-ODT TAKE 1 TABLET (8 MG TOTAL) BY MOUTH EVERY 8 (EIGHT) HOURS AS NEEDED FOR NAUSEA OR VOMITING.   ONE-A-DAY 55 PLUS PO Take 1 tablet by mouth daily.   pantoprazole 40 MG tablet Commonly known as: PROTONIX Take 1 tablet (40  mg total) by mouth daily.        Vitals:   09/15/21 0843 09/15/21 1155  BP: 124/79 133/81  Pulse: 83 83  Resp: 18 18  Temp: 97.6 F (36.4 C) 98 F (36.7 C)  SpO2: 100% 100%    Skin clean, dry and intact without evidence of skin break down, no evidence of skin tears noted. IV catheter discontinued intact. Site without signs and symptoms of complications. Dressing and pressure applied. Pt denies pain at this time. No complaints noted.  An After Visit Summary was printed and given to the patient. Patient escorted via [place], and D/C home via private auto.  Rolley Sims

## 2021-09-15 NOTE — Discharge Summary (Signed)
Physician Discharge Summary  Lori Trevino VVO:[id number] DOB: 08-17-1951 DOA: 09/12/2021  PCP: Glean Hess, MD  Admit date: 09/12/2021 Discharge date: 09/15/2021  Admitted From: Home Disposition: Home  Recommendations for Outpatient Follow-up:  Follow up with PCP in 1-2 weeks Please obtain BMP/CBC in one week Please follow up on the following pending results: None  Home Health: No Equipment/Devices: None Discharge Condition: Stable CODE STATUS: Full Diet recommendation: Heart Healthy / Carb Modified   Brief/Interim Summary: Lori Trevino is a 70 y.o. female with medical history significant for HTN, chronic pain, who presents to the ED with a 2-day history of congestion, chills, muscle aches, nausea, headaches and generalized malaise.  She has a mild cough but denies shortness of breath or chest pain.  Denies vomiting, abdominal pain or diarrhea. Found to have positive COVID-19 PCR.  Chest x-ray without any acute abnormality.  She received 3 days of remdesivir and steroid while in the hospital and discharged on 5 more days of Decadron.  Patient was also found to have mild hyponatremia, she was on HCTZ and Lasix at home.  We discontinued the HCTZ.  Sodium improved to 131 before discharge.  Hyponatremia labs with hypotonic hyponatremia.  She needs to follow-up closely with primary care provider for further recommendations.  She will continue with rest of her antihypertensives.  Patient was having generalized weakness, most likely multifactorial with recent COVID infection and hyponatremia.  Patient refused to work with PT stating that she does not have any physical therapist needs.  She will continue with rest of her home medications and follow-up with her providers.  Discharge Diagnoses:  Principal Problem:   COVID-19 virus infection Active Problems:   COPD with chronic bronchitis ([place])   Essential hypertension   Hyponatremia, acute on chronic   Generalized  weakness    Discharge Instructions  Discharge Instructions     Diet - low sodium heart healthy   Complete by: As directed    Discharge instructions   Complete by: As directed    It was pleasure taking care of you. You are being given steroid for 5 more days, please take it as directed. We discontinued your HCTZ because of low sodium. You can take Lasix only as needed for swelling. Please follow-up closely with your primary care doctor for further recommendations.   Increase activity slowly   Complete by: As directed       Allergies as of 09/15/2021       Reactions   Neurontin [gabapentin] Swelling   Zolpidem Other (See Comments)   sleepwalking        Medication List     STOP taking these medications    hydrochlorothiazide 25 MG tablet Commonly known as: HYDRODIURIL       TAKE these medications    B-COMPLEX PO Take 1 tablet by mouth daily.   busPIRone 15 MG tablet Commonly known as: BUSPAR Take 15 mg by mouth 3 (three) times daily.   cyclobenzaprine 10 MG tablet Commonly known as: FLEXERIL TAKE 1/2-1 TABLET BY MOUTH 3 TIMES A DAY AS NEEDED   dexamethasone 6 MG tablet Commonly known as: DECADRON Take 1 tablet (6 mg total) by mouth daily for 5 days.   fluticasone 50 MCG/ACT nasal spray Commonly known as: FLONASE PLACE 1 SPRAY INTO BOTH NOSTRILS DAILY.   furosemide 20 MG tablet Commonly known as: LASIX Take 1 tablet (20 mg total) by mouth daily as needed.   guaiFENesin 100 MG/5ML liquid Commonly known as: ROBITUSSIN Take 10 mLs  by mouth every 6 (six) hours as needed for cough or to loosen phlegm.   hydrOXYzine 25 MG tablet Commonly known as: ATARAX Take 75 mg by mouth at bedtime as needed.   loratadine 10 MG tablet Commonly known as: CLARITIN Take 10 mg by mouth daily as needed for allergies.   methadone 1 mg/ml  oral solution Commonly known as: DOLOPHINE Take by mouth daily. 100 mg   olmesartan 20 MG tablet Commonly known as:  BENICAR TAKE 1 TABLET BY MOUTH EVERY DAY   ondansetron 8 MG disintegrating tablet Commonly known as: ZOFRAN-ODT TAKE 1 TABLET (8 MG TOTAL) BY MOUTH EVERY 8 (EIGHT) HOURS AS NEEDED FOR NAUSEA OR VOMITING.   ONE-A-DAY 55 PLUS PO Take 1 tablet by mouth daily.   pantoprazole 40 MG tablet Commonly known as: PROTONIX Take 1 tablet (40 mg total) by mouth daily.        Follow-up Information     Glean Hess, MD. Schedule an appointment as soon as possible for a visit in 1 week(s).   Specialty: Internal Medicine Contact information: [address] [place] [address] [phone]                Allergies  Allergen Reactions   Neurontin [Gabapentin] Swelling   Zolpidem Other (See Comments)    sleepwalking     Consultations: None  Procedures/Studies: DG Chest Port 1 View  Result Date: 09/12/2021 CLINICAL DATA:  Flu like symptoms, fever EXAM: PORTABLE CHEST 1 VIEW COMPARISON:  06/12/2014 FINDINGS: Lungs are clear.  No pleural effusion or pneumothorax. The heart is normal in size. IMPRESSION: No evidence of acute cardiopulmonary disease. Electronically Signed   By: Julian Hy M.D.   On: 09/12/2021 23:23    Subjective: Patient was seen and examined today.  No significant overnight events.  Still having some cough but overall feeling much improved.  Saturating 100% on room air.  We discussed about discontinuing HCTZ and having a close follow-up with primary care provider for further recommendations.  Discharge Exam: Vitals:   09/15/21 0431 09/15/21 0843  BP: (!) 160/95 124/79  Pulse: 72 83  Resp: 20 18  Temp: (!) 97.3 F (36.3 C) 97.6 F (36.4 C)  SpO2: 100% 100%   Vitals:   09/14/21 2013 09/15/21 0048 09/15/21 0431 09/15/21 0843  BP: (!) 125/91 (!) 147/93 (!) 160/95 124/79  Pulse: 69 66 72 83  Resp: 18 18 20 18   Temp: 97.8 F (36.6 C) (!) 97.5 F (36.4 C) (!) 97.3 F (36.3 C) 97.6 F (36.4 C)  TempSrc: Oral Oral Oral Oral  SpO2:  100% 100% 100% 100%  Weight:      Height:        General: Pt is alert, awake, not in acute distress Cardiovascular: RRR, S1/S2 +, no rubs, no gallops Respiratory: CTA bilaterally, no wheezing, no rhonchi Abdominal: Soft, NT, ND, bowel sounds + Extremities: no edema, no cyanosis   The results of significant diagnostics from this hospitalization (including imaging, microbiology, ancillary and laboratory) are listed below for reference.    Microbiology: Recent Results (from the past 240 hour(s))  Resp Panel by RT-PCR (Flu A&B, Covid) Nasopharyngeal Swab     Status: Abnormal   Collection Time: 09/12/21  7:51 PM   Specimen: Nasopharyngeal Swab; Nasopharyngeal(NP) swabs in vial transport medium  Result Value Ref Range Status   SARS Coronavirus 2 by RT PCR POSITIVE (A) NEGATIVE Final    Comment: RESULT CALLED TO, READ BACK BY AND VERIFIED  WITH: ASHLEY B @ 2130 10/13/20 L[facility] (NOTE) SARS-CoV-2 target nucleic acids are DETECTED.  The SARS-CoV-2 RNA is generally detectable in upper respiratory specimens during the acute phase of infection. Positive results are indicative of the presence of the identified virus, but do not rule out bacterial infection or co-infection with other pathogens not detected by the test. Clinical correlation with patient history and other diagnostic information is necessary to determine patient infection status. The expected result is Negative.  Fact Sheet for Patients: EntrepreneurPulse.com.au  Fact Sheet for Healthcare Providers: IncredibleEmployment.be  This test is not yet approved or cleared by the [place] FDA and  has been authorized for detection and/or diagnosis of SARS-CoV-2 by FDA under an Emergency Use Authorization (EUA).  This EUA will remain in effect (meaning this test can be used)  for the duration of  the COVID-19 declaration under Section 564(b)(1) of the Act, 21 U.S.C. section 360bbb-3(b)(1), unless  the authorization is terminated or revoked sooner.     Influenza A by PCR NEGATIVE NEGATIVE Final   Influenza B by PCR NEGATIVE NEGATIVE Final    Comment: (NOTE) The Xpert Xpress SARS-CoV-2/FLU/RSV plus assay is intended as an aid in the diagnosis of influenza from Nasopharyngeal swab specimens and should not be used as a sole basis for treatment. Nasal washings and aspirates are unacceptable for Xpert Xpress SARS-CoV-2/FLU/RSV testing.  Fact Sheet for Patients: EntrepreneurPulse.com.au  Fact Sheet for Healthcare Providers: IncredibleEmployment.be  This test is not yet approved or cleared by the [place] FDA and has been authorized for detection and/or diagnosis of SARS-CoV-2 by FDA under an Emergency Use Authorization (EUA). This EUA will remain in effect (meaning this test can be used) for the duration of the COVID-19 declaration under Section 564(b)(1) of the Act, 21 U.S.C. section 360bbb-3(b)(1), unless the authorization is terminated or revoked.  Performed at [facility], [address] [place], [place] [address]      Labs: BNP (last 3 results) Recent Labs    09/12/21 1951  BNP 03.2   Basic Metabolic Panel: Recent Labs  Lab 09/12/21 1951 09/13/21 0639 09/13/21 0932 09/14/21 0632 09/15/21 0635  NA 124*  --  126* 129* 131*  K 3.9  --  3.7 3.5 4.0  CL 92*  --  95* 96* 98  CO2 24  --  25 27 30   GLUCOSE 97  --  107* 88 87  BUN 10  --  11 12 13   CREATININE 0.54 0.64 0.48 0.57 0.53  CALCIUM 8.9  --  8.6* 8.9 8.5*  MG  --   --  1.8 1.9  --   PHOS  --   --  3.1 2.7  --    Liver Function Tests: Recent Labs  Lab 09/12/21 1951  AST 19  ALT 14  ALKPHOS 74  BILITOT 0.8  PROT 6.6  ALBUMIN 3.7   Recent Labs  Lab 09/12/21 1951  LIPASE 23   No results for input(s): AMMONIA in the last 168 hours. CBC: Recent Labs  Lab 09/12/21 1951 09/13/21 0639 09/13/21 0932 09/14/21 0632  WBC 3.9* 2.9* 2.7* 2.8*   HGB 10.7* 11.1* 11.0* 11.5*  HCT 31.0* 32.3* 31.7* 33.6*  MCV 87.6 86.8 86.6 85.7  PLT 204 196 183 204   Cardiac Enzymes: Recent Labs  Lab 09/13/21 0932  CKTOTAL 75   BNP: Invalid input(s): POCBNP CBG: No results for input(s): GLUCAP in the last 168 hours. D-Dimer Recent Labs    09/13/21 0024  DDIMER 0.41  Hgb A1c No results for input(s): HGBA1C in the last 72 hours. Lipid Profile No results for input(s): CHOL, HDL, LDLCALC, TRIG, CHOLHDL, LDLDIRECT in the last 72 hours. Thyroid function studies Recent Labs    09/13/21 0932  TSH 0.516   Anemia work up Recent Labs    09/13/21 0024 09/13/21 0932  VITAMINB12  --  580  FOLATE  --  35.0  FERRITIN 25  --   TIBC  --  308  IRON  --  28   Urinalysis    Component Value Date/Time   COLORURINE YELLOW (A) 10/02/2020 1055   APPEARANCEUR CLEAR (A) 10/02/2020 1055   LABSPEC 1.005 10/02/2020 1055   PHURINE 7.0 10/02/2020 1055   GLUCOSEU NEGATIVE 10/02/2020 1055   HGBUR NEGATIVE 10/02/2020 [place] 10/02/2020 1055   [place] 10/02/2020 1055   PROTEINUR NEGATIVE 10/02/2020 1055   NITRITE NEGATIVE 10/02/2020 1055   LEUKOCYTESUR TRACE (A) 10/02/2020 1055   Sepsis Labs Invalid input(s): PROCALCITONIN,  WBC,  LACTICIDVEN Microbiology Recent Results (from the past 240 hour(s))  Resp Panel by RT-PCR (Flu A&B, Covid) Nasopharyngeal Swab     Status: Abnormal   Collection Time: 09/12/21  7:51 PM   Specimen: Nasopharyngeal Swab; Nasopharyngeal(NP) swabs in vial transport medium  Result Value Ref Range Status   SARS Coronavirus 2 by RT PCR POSITIVE (A) NEGATIVE Final    Comment: RESULT CALLED TO, READ BACK BY AND VERIFIED WITH: ASHLEY B @ 2130 10/13/20 LFD (NOTE) SARS-CoV-2 target nucleic acids are DETECTED.  The SARS-CoV-2 RNA is generally detectable in upper respiratory specimens during the acute phase of infection. Positive results are indicative of the presence of the identified virus, but  do not rule out bacterial infection or co-infection with other pathogens not detected by the test. Clinical correlation with patient history and other diagnostic information is necessary to determine patient infection status. The expected result is Negative.  Fact Sheet for Patients: EntrepreneurPulse.com.au  Fact Sheet for Healthcare Providers: IncredibleEmployment.be  This test is not yet approved or cleared by the [place] FDA and  has been authorized for detection and/or diagnosis of SARS-CoV-2 by FDA under an Emergency Use Authorization (EUA).  This EUA will remain in effect (meaning this test can be used)  for the duration of  the COVID-19 declaration under Section 564(b)(1) of the Act, 21 U.S.C. section 360bbb-3(b)(1), unless the authorization is terminated or revoked sooner.     Influenza A by PCR NEGATIVE NEGATIVE Final   Influenza B by PCR NEGATIVE NEGATIVE Final    Comment: (NOTE) The Xpert Xpress SARS-CoV-2/FLU/RSV plus assay is intended as an aid in the diagnosis of influenza from Nasopharyngeal swab specimens and should not be used as a sole basis for treatment. Nasal washings and aspirates are unacceptable for Xpert Xpress SARS-CoV-2/FLU/RSV testing.  Fact Sheet for Patients: EntrepreneurPulse.com.au  Fact Sheet for Healthcare Providers: IncredibleEmployment.be  This test is not yet approved or cleared by the [place] FDA and has been authorized for detection and/or diagnosis of SARS-CoV-2 by FDA under an Emergency Use Authorization (EUA). This EUA will remain in effect (meaning this test can be used) for the duration of the COVID-19 declaration under Section 564(b)(1) of the Act, 21 U.S.C. section 360bbb-3(b)(1), unless the authorization is terminated or revoked.  Performed at [facility], [address], [place], [place] [address]     Time coordinating  discharge: Over 30 minutes  SIGNED:  Lorella Nimrod, MD  [facility] 09/15/2021, 11:53 AM  If 7PM-7AM, please contact night-coverage [email or web address]  This record has been created using Systems analyst. Errors have been sought and corrected,but may not always be located. Such creation errors do not reflect on the standard of care.

## 2021-09-15 NOTE — Telephone Encounter (Signed)
Courtesy refill #[id number] Pt has appt 09/22/21

## 2021-09-15 NOTE — Plan of Care (Signed)
°  Problem: Clinical Measurements: Goal: Ability to maintain clinical measurements within normal limits will improve 09/15/2021 0138 by Otilio Jefferson, RN Outcome: Progressing 09/15/2021 0138 by Otilio Jefferson, RN Outcome: Progressing   Problem: Clinical Measurements: Goal: Will remain free from infection 09/15/2021 0138 by Otilio Jefferson, RN Outcome: Progressing 09/15/2021 0138 by Otilio Jefferson, RN Outcome: Progressing   Problem: Clinical Measurements: Goal: Diagnostic test results will improve 09/15/2021 0138 by Otilio Jefferson, RN Outcome: Progressing 09/15/2021 0138 by Otilio Jefferson, RN Outcome: Progressing

## 2021-09-16 ENCOUNTER — Telehealth: Payer: Self-pay

## 2021-09-16 NOTE — Telephone Encounter (Signed)
Transition Care Management Unsuccessful Follow-up Telephone Call  Date of discharge and from where:  09/15/21 [facility]  Attempts:  1st Attempt  Reason for unsuccessful TCM follow-up call:  Left voice message

## 2021-09-22 ENCOUNTER — Inpatient Hospital Stay: Payer: Medicare Other | Admitting: Internal Medicine

## 2021-09-23 ENCOUNTER — Encounter: Payer: Self-pay | Admitting: Internal Medicine

## 2021-09-23 ENCOUNTER — Ambulatory Visit (INDEPENDENT_AMBULATORY_CARE_PROVIDER_SITE_OTHER): Payer: Medicare Other | Admitting: Internal Medicine

## 2021-09-23 ENCOUNTER — Other Ambulatory Visit: Payer: Self-pay

## 2021-09-23 VITALS — BP 122/86 | HR 86 | Ht 61.0 in | Wt 186.0 lb

## 2021-09-23 DIAGNOSIS — E871 Hypo-osmolality and hyponatremia: Secondary | ICD-10-CM | POA: Diagnosis not present

## 2021-09-23 DIAGNOSIS — F419 Anxiety disorder, unspecified: Secondary | ICD-10-CM

## 2021-09-23 DIAGNOSIS — I10 Essential (primary) hypertension: Secondary | ICD-10-CM

## 2021-09-23 DIAGNOSIS — F331 Major depressive disorder, recurrent, moderate: Secondary | ICD-10-CM

## 2021-09-23 DIAGNOSIS — J1282 Pneumonia due to coronavirus disease 2019: Secondary | ICD-10-CM

## 2021-09-23 DIAGNOSIS — U071 COVID-19: Secondary | ICD-10-CM

## 2021-09-23 MED ORDER — PROMETHAZINE-DM 6.25-15 MG/5ML PO SYRP: 5.0000 mL | ORAL_SOLUTION | Freq: Four times a day (QID) | ORAL | 0 refills | Status: DC | PRN

## 2021-09-23 MED ORDER — DOXYCYCLINE HYCLATE 100 MG PO TABS: 100.0000 mg | ORAL_TABLET | Freq: Two times a day (BID) | ORAL | 0 refills | Status: AC

## 2021-09-23 MED ORDER — ALBUTEROL SULFATE HFA 108 (90 BASE) MCG/ACT IN AERS: 2.0000 | INHALATION_SPRAY | Freq: Four times a day (QID) | RESPIRATORY_TRACT | 0 refills | Status: DC | PRN

## 2021-09-23 NOTE — Progress Notes (Signed)
Date:  09/23/2021   Name:  Lori Trevino   DOB:  [birth year]   MRN:  [id number]   Chief Complaint: Hospitalization Follow-up Hospital follow up from [facility].  Admitted  09/12/21 to 09/15/21.  TOC call done on 09/16/21.  Admitted From: Home Disposition: Home   Recommendations for Outpatient Follow-up:  Follow up with PCP in 1-2 weeks Please obtain BMP/CBC in one week Please follow up on the following pending results: None   Home Health: No Equipment/Devices: None Discharge Condition: Stable CODE STATUS: Full Diet recommendation: Heart Healthy / Carb Modified    Brief/Interim Summary: Jaunita Mikels is a 70 y.o. female with medical history significant for HTN, chronic pain, who presents to the ED with a 2-day history of congestion, chills, muscle aches, nausea, headaches and generalized malaise.  She has a mild cough but denies shortness of breath or chest pain.  Denies vomiting, abdominal pain or diarrhea. Found to have positive COVID-19 PCR.  Chest x-ray without any acute abnormality.  She received 3 days of remdesivir and steroid while in the hospital and discharged on 5 more days of Decadron.   Patient was also found to have mild hyponatremia, she was on HCTZ and Lasix at home.  We discontinued the HCTZ.  Sodium improved to 131 before discharge.  Hyponatremia labs with hypotonic hyponatremia.  She needs to follow-up closely with primary care provider for further recommendations.  She will continue with rest of her antihypertensives.   Patient was having generalized weakness, most likely multifactorial with recent COVID infection and hyponatremia.  Patient refused to work with PT stating that she does not have any physical therapist needs.   She will continue with rest of her home medications and follow-up with her providers.   Discharge Diagnoses:  Principal Problem:   COVID-19 virus infection Active Problems:   COPD with chronic bronchitis ([place])   Essential hypertension    Hyponatremia, acute on chronic   Generalized weakness   Cough This is a new problem. The current episode started in the past 7 days. The problem has been gradually worsening. The cough is Productive of sputum (green phlegm). Associated symptoms include nasal congestion and shortness of breath. Pertinent negatives include no chest pain, chills, fever or headaches. Risk factors: recently hospitalized with Covid. She has tried a beta-agonist inhaler (using her friends inhaler) for the symptoms.   Lab Results  Component Value Date   NA 131 (L) 09/15/2021   K 4.0 09/15/2021   CO2 30 09/15/2021   GLUCOSE 87 09/15/2021   BUN 13 09/15/2021   CREATININE 0.53 09/15/2021   CALCIUM 8.5 (L) 09/15/2021   GFRNONAA >60 09/15/2021   Lab Results  Component Value Date   CHOL 244 (H) 10/27/2017   HDL 116 10/27/2017   LDLCALC 116 (H) 10/27/2017   TRIG 59 10/27/2017   CHOLHDL 2.1 10/27/2017   Lab Results  Component Value Date   TSH 0.516 09/13/2021   No results found for: HGBA1C Lab Results  Component Value Date   WBC 2.8 (L) 09/14/2021   HGB 11.5 (L) 09/14/2021   HCT 33.6 (L) 09/14/2021   MCV 85.7 09/14/2021   PLT 204 09/14/2021   Lab Results  Component Value Date   ALT 14 09/12/2021   AST 19 09/12/2021   ALKPHOS 74 09/12/2021   BILITOT 0.8 09/12/2021   Lab Results  Component Value Date   VD25OH 32.01 09/13/2021     Review of Systems  Constitutional:  Positive for unexpected weight change (  has gained weight this year). Negative for chills and fever.  Respiratory:  Positive for cough and shortness of breath.   Cardiovascular:  Negative for chest pain, palpitations and leg swelling.  Gastrointestinal:  Negative for blood in stool, nausea and vomiting.  Neurological:  Negative for headaches.  Psychiatric/Behavioral:  Negative for confusion, decreased concentration and dysphoric mood.    Patient Active Problem List   Diagnosis Date Noted   COVID-19 virus infection 09/13/2021    Hyponatremia, acute on chronic 09/13/2021   Generalized weakness 09/13/2021   Leg pain 12/28/2020   Raynaud'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 12/17/2020   Arterial insufficiency of lower extremity ([place]) 12/17/2020   Pedal edema 02/26/2020   Sensorineural hearing loss (SNHL) of both ears 09/17/2019   Essential hypertension 08/23/2019   Narcotic addiction ([place]) 08/23/2019   Psychophysiological insomnia 08/23/2019   Tinnitus of both ears 08/23/2019   Elevated glucose level 02/05/2018   Gait instability 10/27/2017   Environmental and seasonal allergies 02/14/2017   Anxiety about health 12/10/2015   Mixed hyperlipidemia 12/08/2015   Body mass index (BMI) greater than 25 06/09/2015   Chronic pain 05/02/2015   Osteoarthritis 06/16/2014   Radiculopathy of lumbar region 06/16/2014   COPD with chronic bronchitis ([place]) 06/12/2014   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[place]) 06/12/2014   Chronic insomnia 06/12/2014   Vitamin D deficiency 05/31/2014   Neuralgia and neuritis 06/25/2013   Arthritis, midfoot 06/18/2013    Allergies  Allergen Reactions   Neurontin [Gabapentin] Swelling   Zolpidem Other (See Comments)    sleepwalking     Past Surgical History:  Procedure Laterality Date   AXILLARY SURGERY Right 2008   CHOLECYSTECTOMY     CYSTOCELE REPAIR N/A 02/08/2016   Procedure: ANTERIOR REPAIR (CYSTOCELE);  Surgeon: Boykin Nearing, MD;  Location: [facility] ORS;  Service: Gynecology;  Laterality: N/A;   LAPAROSCOPIC GASTRIC SLEEVE RESECTION WITH HIATAL HERNIA REPAIR  2015   TONSILLECTOMY     TUBAL LIGATION     VAGINAL HYSTERECTOMY N/A 02/08/2016   Procedure: HYSTERECTOMY VAGINAL WITH BSO;  Surgeon: Boykin Nearing, MD;  Location: [facility] ORS;  Service: Gynecology;  Laterality: N/A;   VARICOSE VEIN SURGERY      Social History   Tobacco Use   Smoking status: Every Day    Packs/day: 0.10    Years: 60.00    Pack years: 6.00    Types: Cigarettes   Smokeless  tobacco: Never   Tobacco comments:    2-3 daily  Vaping Use   Vaping Use: Never used  Substance Use Topics   Alcohol use: Not Currently    Alcohol/week: 0.0 standard drinks   Drug use: No     Medication list has been reviewed and updated.  Current Meds  Medication Sig   B Complex-Biotin-FA (B-COMPLEX PO) Take 1 tablet by mouth daily.   busPIRone (BUSPAR) 15 MG tablet Take 15 mg by mouth 3 (three) times daily.   cyclobenzaprine (FLEXERIL) 10 MG tablet TAKE 1/2-1 TABLET BY MOUTH 3 TIMES A DAY AS NEEDED   fluticasone (FLONASE) 50 MCG/ACT nasal spray PLACE 1 SPRAY INTO BOTH NOSTRILS DAILY.   furosemide (LASIX) 20 MG tablet Take 1 tablet (20 mg total) by mouth daily as needed.   hydrOXYzine (ATARAX) 25 MG tablet Take 75 mg by mouth at bedtime as needed.   loratadine (CLARITIN) 10 MG tablet Take 10 mg by mouth daily as needed for allergies.   methadone (DOLOPHINE) 1 mg/ml oral solution Take by mouth daily.  100 mg   Multiple Vitamin (ONE-A-DAY 55 PLUS PO) Take 1 tablet by mouth daily.   olmesartan (BENICAR) 20 MG tablet TAKE 1 TABLET BY MOUTH EVERY DAY   ondansetron (ZOFRAN-ODT) 8 MG disintegrating tablet TAKE 1 TABLET (8 MG TOTAL) BY MOUTH EVERY 8 (EIGHT) HOURS AS NEEDED FOR NAUSEA OR VOMITING.   pantoprazole (PROTONIX) 40 MG tablet Take 1 tablet (40 mg total) by mouth daily.   [DISCONTINUED] guaiFENesin (ROBITUSSIN) 100 MG/5ML liquid Take 10 mLs by mouth every 6 (six) hours as needed for cough or to loosen phlegm.    PHQ 2/9 Scores 09/23/2021 06/12/2021 12/17/2020 08/25/2020  PHQ - 2 Score 0 0 0 2  PHQ- 9 Score 0 0 3 8    GAD 7 : Generalized Anxiety Score 09/23/2021 06/12/2021 12/17/2020 08/25/2020  Nervous, Anxious, on Edge 0 0 1 1  Control/stop worrying 0 0 1 1  Worry too much - different things 0 0 1 1  Trouble relaxing 0 0 0 0  Restless 0 0 0 1  Easily annoyed or irritable 0 0 0 0  Afraid - awful might happen 0 0 0 0  Total GAD 7 Score 0 0 3 4  Anxiety Difficulty Not difficult  at all Not difficult at all - Not difficult at all    BP Readings from Last 3 Encounters:  09/23/21 122/86  09/15/21 133/81  02/24/21 (!) 162/92    Physical Exam Constitutional:      Appearance: She is ill-appearing.  Cardiovascular:     Rate and Rhythm: Regular rhythm. Tachycardia present.     Pulses: Normal pulses.  Pulmonary:     Effort: Pulmonary effort is normal.     Breath sounds: Rhonchi present. No wheezing.  Musculoskeletal:     Cervical back: Normal range of motion.     Right lower leg: Edema present.     Left lower leg: Edema present.  Lymphadenopathy:     Cervical: No cervical adenopathy.  Neurological:     General: No focal deficit present.     Mental Status: She is alert.  Psychiatric:        Mood and Affect: Mood normal.        Behavior: Behavior normal.    Wt Readings from Last 3 Encounters:  09/23/21 186 lb (84.4 kg)  09/13/21 192 lb 8 oz (87.3 kg)  02/24/21 179 lb 9.6 oz (81.5 kg)    BP 122/86    Pulse 86    Ht 5\' 1"  (1.549 m)    Wt 186 lb (84.4 kg)    SpO2 96%    BMI 35.14 kg/m   Assessment and Plan: 1. Pneumonia due to COVID-19 virus Suspect PNA or bronchitis - O2 sat is good and no focal abnormal lung sounds Will treat presumptively - follow up if needed - doxycycline (VIBRA-TABS) 100 MG tablet; Take 1 tablet (100 mg total) by mouth 2 (two) times daily for 10 days.  Dispense: 20 tablet; Refill: 0 - promethazine-dextromethorphan (PROMETHAZINE-DM) 6.25-15 MG/5ML syrup; Take 5 mLs by mouth 4 (four) times daily as needed for cough.  Dispense: 180 mL; Refill: 0 - CBC with Differential/Platelet  2. Essential hypertension Clinically stable exam with well controlled BP on Benicar.  No increase since stopping HCTZ. Tolerating medications without side effects at this time. Pt to continue current regimen and low sodium diet; benefits of regular exercise as able discussed.  3. Hyponatremia Remain off of HCTZ Recheck BMP - Basic metabolic panel  4.  Recurrent moderate  major depressive disorder with anxiety ([place]) Clinically stable on current regimen with good control of symptoms, No SI or HI. Will continue current therapy.   Partially dictated using Editor, commissioning. Any errors are unintentional.  Halina Maidens, MD [facility]  09/23/2021

## 2021-09-24 LAB — CBC WITH DIFFERENTIAL/PLATELET
Basophils Absolute: 0 10*3/uL (ref 0.0–0.2)
Basos: 0 %
EOS (ABSOLUTE): 0.2 10*3/uL (ref 0.0–0.4)
Eos: 3 %
Hematocrit: 33 % — ABNORMAL LOW (ref 34.0–46.6)
Hemoglobin: 10.9 g/dL — ABNORMAL LOW (ref 11.1–15.9)
Immature Grans (Abs): 0 10*3/uL (ref 0.0–0.1)
Immature Granulocytes: 1 %
Lymphocytes Absolute: 1.1 10*3/uL (ref 0.7–3.1)
Lymphs: 21 %
MCH: 29.5 pg (ref 26.6–33.0)
MCHC: 33 g/dL (ref 31.5–35.7)
MCV: 89 fL (ref 79–97)
Monocytes Absolute: 0.7 10*3/uL (ref 0.1–0.9)
Monocytes: 13 %
Neutrophils Absolute: 3.3 10*3/uL (ref 1.4–7.0)
Neutrophils: 62 %
Platelets: 282 10*3/uL (ref 150–450)
RBC: 3.7 x10E6/uL — ABNORMAL LOW (ref 3.77–5.28)
RDW: 12.1 % (ref 11.7–15.4)
WBC: 5.3 10*3/uL (ref 3.4–10.8)

## 2021-09-24 LAB — BASIC METABOLIC PANEL
BUN/Creatinine Ratio: 16 (ref 12–28)
BUN: 10 mg/dL (ref 8–27)
CO2: 26 mmol/L (ref 20–29)
Calcium: 9.1 mg/dL (ref 8.7–10.3)
Chloride: 98 mmol/L (ref 96–106)
Creatinine, Ser: 0.62 mg/dL (ref 0.57–1.00)
Glucose: 79 mg/dL (ref 70–99)
Potassium: 5.2 mmol/L (ref 3.5–5.2)
Sodium: 135 mmol/L (ref 134–144)
eGFR: 95 mL/min/{1.73_m2} (ref >59)

## 2021-09-28 ENCOUNTER — Ambulatory Visit: Payer: Medicare Other | Admitting: Internal Medicine

## 2021-10-15 ENCOUNTER — Other Ambulatory Visit: Payer: Self-pay | Admitting: Internal Medicine

## 2021-10-15 DIAGNOSIS — F5104 Psychophysiologic insomnia: Secondary | ICD-10-CM

## 2021-10-15 NOTE — Telephone Encounter (Signed)
Requested medication (s) are due for refill today: no, requesting early.  Requested medication (s) are on the active medication list: yes  Last refill:  09/23/21  Future visit scheduled: no  Notes to clinic:  protocol states to report if pt requesting any inhaler early, please assess.  Requested Prescriptions  Pending Prescriptions Disp Refills   albuterol (VENTOLIN HFA) 108 (90 Base) MCG/ACT inhaler [Pharmacy Med Name: ALBUTEROL HFA (PROAIR) INHALER] 8.5 each     Sig: TAKE 2 PUFFS BY MOUTH EVERY 6 HOURS AS NEEDED FOR WHEEZE OR SHORTNESS OF BREATH     Pulmonology:  Beta Agonists Failed - 10/15/2021  6:18 PM      Failed - One inhaler should last at least one month. If the patient is requesting refills earlier, contact the patient to check for uncontrolled symptoms.      Passed - Valid encounter within last 12 months    Recent Outpatient Visits           3 weeks ago Essential hypertension   [place], [place]   10 months ago Raynaud'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   [facility] Glean Hess, MD   1 year ago Essential hypertension   [facility] Glean Hess, MD   1 year ago Essential hypertension   [facility] Glean Hess, MD   1 year ago Environmental and seasonal allergies   [facility], [facility]               hydrOXYzine (ATARAX) 25 MG tablet [Pharmacy Med Name: HYDROXYZINE HCL 25 MG TABLET] 270 tablet 1    Sig: TAKE 3 TABLETS (75 MG TOTAL) BY MOUTH AT BEDTIME AS NEEDED.     Ear, Nose, and Throat:  Antihistamines Passed - 10/15/2021  6:18 PM      Passed - Valid encounter within last 12 months    Recent Outpatient Visits           3 weeks ago Essential hypertension   [place], [place]   10 months ago Raynaud'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   [facility] Glean Hess, MD   1 year ago Essential hypertension   [facility] Glean Hess, MD   1 year ago Essential hypertension   [place], Laura H, MD   1 year ago Environmental and seasonal allergies   [facility] Glean Hess, MD

## 2021-11-10 ENCOUNTER — Other Ambulatory Visit: Payer: Self-pay | Admitting: Internal Medicine

## 2021-11-10 DIAGNOSIS — F5104 Psychophysiologic insomnia: Secondary | ICD-10-CM

## 2021-11-10 MED ORDER — HYDROXYZINE PAMOATE 25 MG PO CAPS: 75.0000 mg | ORAL_CAPSULE | Freq: Every evening | ORAL | 1 refills | Status: DC | PRN

## 2021-11-10 NOTE — Telephone Encounter (Signed)
Requested medication (s) are due for refill today: requesting syrup   Requested medication (s) are on the active medication list: yes  Last refill:  10/19/21 #270 tabs 1 refill  Future visit scheduled: no  Notes to clinic:  Pharmacy comment: Alternative Requested:CHANGE TO CAPSULES PER INSURANCE.  Requesting syrup formula please advise      Requested Prescriptions  Pending Prescriptions Disp Refills   hydrOXYzine (ATARAX) 10 MG/5ML syrup [Pharmacy Med Name: HYDROXYZINE 10 MG/5 ML SYRUP]  0     Ear, Nose, and Throat:  Antihistamines 2 Passed - 11/10/2021  7:54 AM      Passed - Cr in normal range and within 360 days    Creatinine, Ser  Date Value Ref Range Status  09/23/2021 0.62 0.57 - 1.00 mg/dL Final          Passed - Valid encounter within last 12 months    Recent Outpatient Visits           1 month ago Essential hypertension   [place], [place]   10 months ago Raynaud'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   [facility] Glean Hess, MD   1 year ago Essential hypertension   [facility] Glean Hess, MD   1 year ago Essential hypertension   [place], Laura H, MD   1 year ago Environmental and seasonal allergies   [facility] Glean Hess, MD

## 2021-11-10 NOTE — Telephone Encounter (Signed)
Please review.  KP

## 2021-11-16 ENCOUNTER — Telehealth: Payer: Self-pay

## 2021-11-16 NOTE — Telephone Encounter (Signed)
Completed PA on [email or web address] for hydroxyzine 25 mg TID PRN.  KeyDellie Catholic - PA Case ID: [id number] - Rx #: [id number]  Awaiting outcome.

## 2021-11-16 NOTE — Telephone Encounter (Signed)
Approved 10/03/2021-11/16/2022.  KP

## 2021-11-23 ENCOUNTER — Telehealth: Payer: Self-pay

## 2021-11-23 NOTE — Telephone Encounter (Signed)
Copied from [place] [id number]. Topic: Referral - Request for Referral >> Nov 23, 2021  2:09 PM Alanda Slim E wrote: Has patient seen PCP for this complaint? No  *If NO, is insurance requiring patient see PCP for this issue before PCP can refer them? Referral for which specialty: Dermatologist  Preferred provider/office: closest to Dr. Oneal Deputy office  Reason for referral: ruff patches on scalp  Left message for patient to contact dermatologist.

## 2021-12-08 ENCOUNTER — Other Ambulatory Visit: Payer: Self-pay | Admitting: Internal Medicine

## 2021-12-08 DIAGNOSIS — I10 Essential (primary) hypertension: Secondary | ICD-10-CM

## 2021-12-09 NOTE — Telephone Encounter (Signed)
Requested Prescriptions  ?Pending Prescriptions Disp Refills  ?? olmesartan (BENICAR) 20 MG tablet [Pharmacy Med Name: OLMESARTAN MEDOXOMIL 20 MG TAB] 90 tablet 0  ?  Sig: TAKE 1 TABLET BY MOUTH EVERY DAY  ?  ? Cardiovascular:  Angiotensin Receptor Blockers Passed - 12/08/2021 12:24 PM  ?  ?  Passed - Cr in normal range and within 180 days  ?  Creatinine, Ser  ?Date Value Ref Range Status  ?09/23/2021 0.62 0.57 - 1.00 mg/dL Final  ?   ?  ?  Passed - K in normal range and within 180 days  ?  Potassium  ?Date Value Ref Range Status  ?09/23/2021 5.2 3.5 - 5.2 mmol/L Final  ?   ?  ?  Passed - Patient is not pregnant  ?  ?  Passed - Last BP in normal range  ?  BP Readings from Last 1 Encounters:  ?09/23/21 122/86  ?   ?  ?  Passed - Valid encounter within last 6 months  ?  Recent Outpatient Visits   ?      ? 2 months ago Essential hypertension  ? [facility] Glean Hess, MD  ? 11 months ago Raynaud'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  ? [facility] Glean Hess, MD  ? 1 year ago Essential hypertension  ? [facility] Glean Hess, MD  ? 1 year ago Essential hypertension  ? [facility] Glean Hess, MD  ? 1 year ago Environmental and seasonal allergies  ? [facility] Glean Hess, MD  ?  ?  ? ?  ?  ?  ? ?

## 2021-12-17 ENCOUNTER — Ambulatory Visit: Payer: Self-pay

## 2021-12-17 NOTE — Telephone Encounter (Signed)
Pt called saying she has a swollen lymph node in her neck that came up yesterday and is painful.  There is not an appt with Dr. Army Melia until Monday.  Please advise  She wanted to be seen today.  ? ?CB#  [phone]  ? ? ? ?Chief Complaint: Swollen lymph node left neck ?Symptoms: Tender ?Frequency: Yesterday ?Pertinent Negatives: Patient denies fever or other symptoms ?Disposition: '[]'$ ED /'[]'$ Urgent Care (no appt availability in office) / '[x]'$ Appointment(In office/virtual)/ '[]'$  [facility]/ '[]'$ Home Care/ '[]'$ Refused Recommended Disposition /'[]'$ [place] [facility]/ '[]'$  Follow-up with PCP ?Additional Notes:   ?Reason for Disposition ? [1] Very tender to the touch AND [2] no fever ? ?Answer Assessment - Initial Assessment Questions ?1. LOCATION: "Where is the swollen node located?" "Is the matching node on the other side of the body also swollen?"  ?    Left side of neck ?2. SIZE: "How big is the node?" (e.g., inches or centimeters; or compared to common objects such as pea, bean, marble, golf ball)  ?    Coin ?3. ONSET: "When did the swelling start?"  ?    Yesterday ?4. NECK NODES: "Is there a sore throat, runny nose or other symptoms of a cold?"  ?    No ?5. GROIN OR ARMPIT NODES: "Is there a sore, scratch, cut or painful red area on that arm or leg?"  ?    No ?6. FEVER: "Do you have a fever?" If Yes, ask: "What is it, how was it measured, and when did it start?"  ?    No ?7. CAUSE: "What do you think is causing the swollen lymph nodes?" ?    Unsure ?8. OTHER SYMPTOMS: "Do you have any other symptoms?" ?    No ?9. PREGNANCY: "Is there any chance you are pregnant?" "When was your last menstrual period?" ?    No ? ?Protocols used: Lymph Nodes - Swollen-A-AH ? ?

## 2021-12-20 ENCOUNTER — Ambulatory Visit: Payer: Medicare Other | Admitting: Internal Medicine

## 2022-02-02 ENCOUNTER — Other Ambulatory Visit: Payer: Self-pay | Admitting: Internal Medicine

## 2022-02-02 DIAGNOSIS — I10 Essential (primary) hypertension: Secondary | ICD-10-CM

## 2022-02-03 NOTE — Telephone Encounter (Signed)
Requested Prescriptions  ?Pending Prescriptions Disp Refills  ?? olmesartan (BENICAR) 20 MG tablet [Pharmacy Med Name: OLMESARTAN MEDOXOMIL 20 MG TAB] 90 tablet 0  ?  Sig: TAKE 1 TABLET BY MOUTH EVERY DAY  ?  ? Cardiovascular:  Angiotensin Receptor Blockers Passed - 02/02/2022  8:21 PM  ?  ?  Passed - Cr in normal range and within 180 days  ?  Creatinine, Ser  ?Date Value Ref Range Status  ?09/23/2021 0.62 0.57 - 1.00 mg/dL Final  ?   ?  ?  Passed - K in normal range and within 180 days  ?  Potassium  ?Date Value Ref Range Status  ?09/23/2021 5.2 3.5 - 5.2 mmol/L Final  ?   ?  ?  Passed - Patient is not pregnant  ?  ?  Passed - Last BP in normal range  ?  BP Readings from Last 1 Encounters:  ?09/23/21 122/86  ?   ?  ?  Passed - Valid encounter within last 6 months  ?  Recent Outpatient Visits   ?      ? 4 months ago Essential hypertension  ? [facility] Glean Hess, MD  ? 1 year ago Raynaud'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  ? [facility] Glean Hess, MD  ? 1 year ago Essential hypertension  ? [facility] Glean Hess, MD  ? 1 year ago Essential hypertension  ? [facility] Glean Hess, MD  ? 1 year ago Environmental and seasonal allergies  ? [facility] Glean Hess, MD  ?  ?  ? ?  ?  ?  ? ? ?

## 2022-02-20 IMAGING — DX DG CHEST 1V PORT
1 series · 1 of 1 positions shown · non-contrast
Comparison: 06/12/2014

CLINICAL DATA: Flu like symptoms, fever

EXAM:
PORTABLE CHEST 1 VIEW

[chest ap]
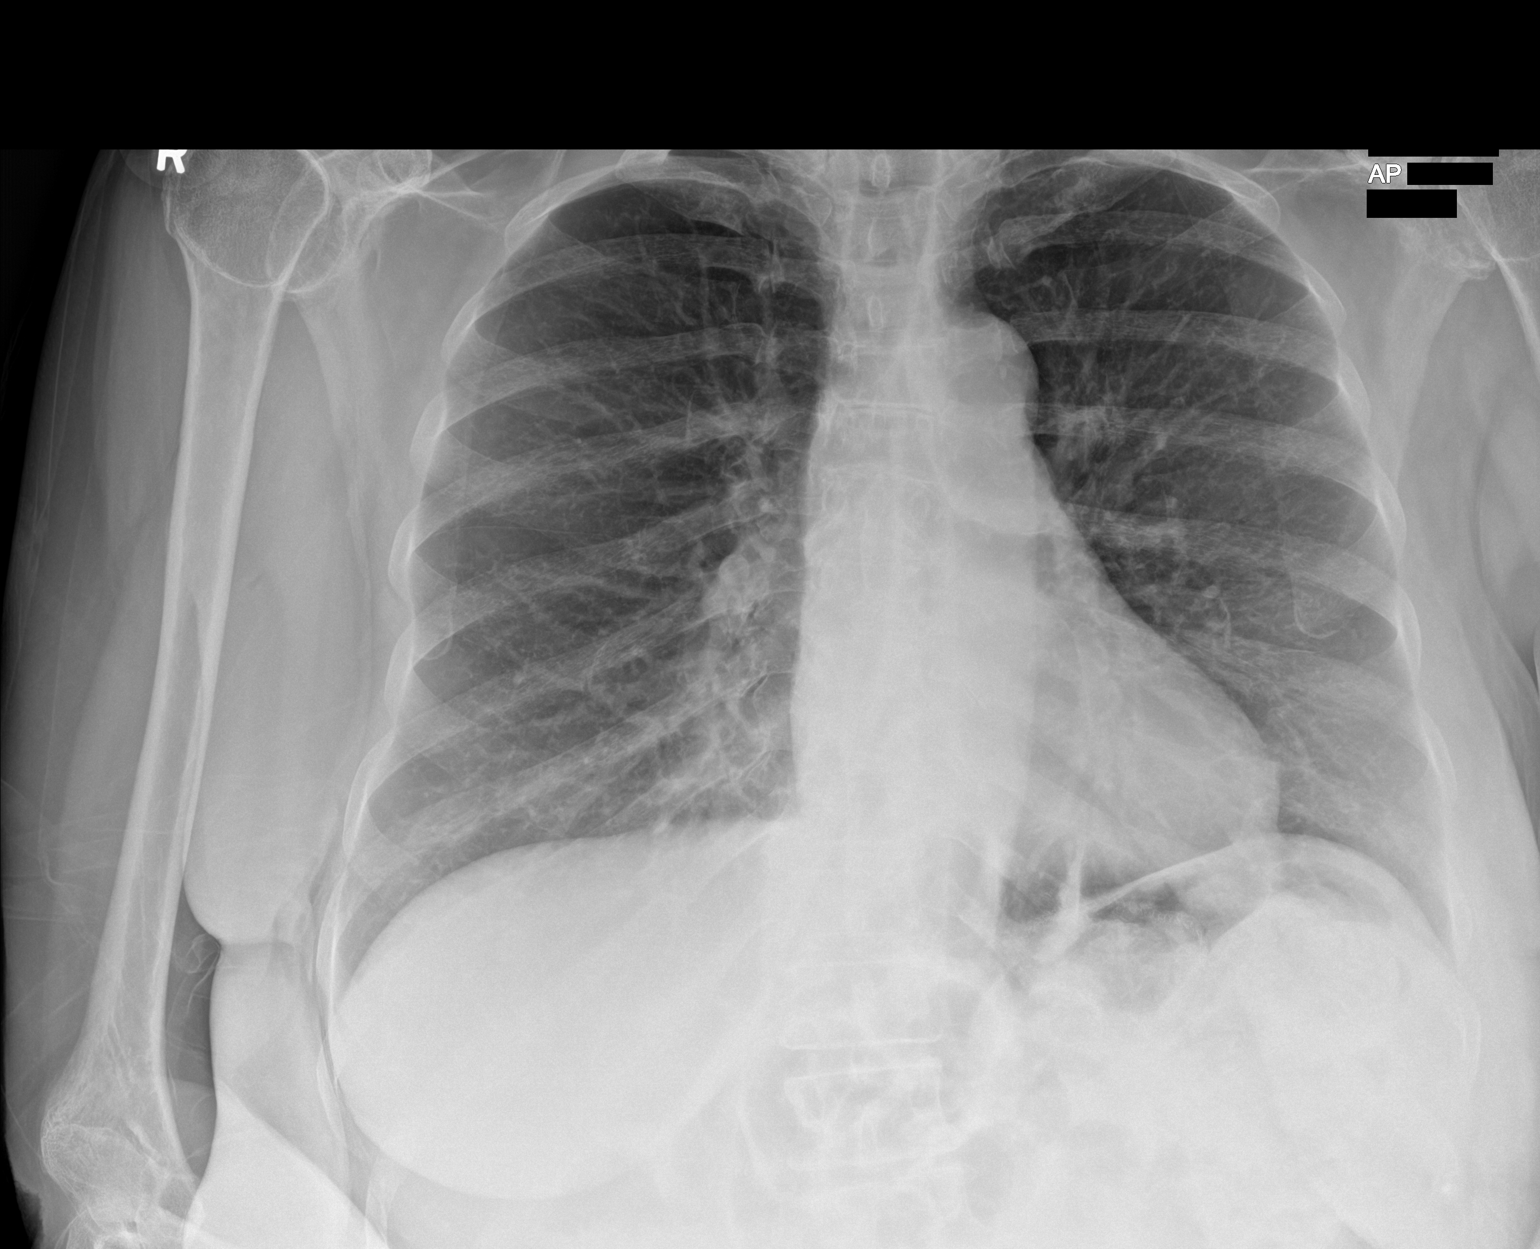

[1 of 1 positions shown; findings below may reference images not displayed]

FINDINGS: Lungs are clear.  No pleural effusion or pneumothorax.

The heart is normal in size.
IMPRESSION: No evidence of acute cardiopulmonary disease.

## 2022-05-26 ENCOUNTER — Encounter (INDEPENDENT_AMBULATORY_CARE_PROVIDER_SITE_OTHER): Payer: Medicare Other

## 2022-05-26 ENCOUNTER — Ambulatory Visit (INDEPENDENT_AMBULATORY_CARE_PROVIDER_SITE_OTHER): Payer: Medicare Other | Admitting: Vascular Surgery

## 2022-05-30 ENCOUNTER — Other Ambulatory Visit: Payer: Self-pay | Admitting: Internal Medicine

## 2022-05-30 DIAGNOSIS — I10 Essential (primary) hypertension: Secondary | ICD-10-CM

## 2022-05-31 NOTE — Telephone Encounter (Signed)
Called Lori Trevino and got her scheduled for a 6 month check with Dr. Army Melia for 06/22/2022 at 3:00.  There were several appts available today but she did not want to come in today.     A 30 day courtesy refill given for the Benicar 20 mg.

## 2022-06-10 ENCOUNTER — Telehealth: Payer: Self-pay | Admitting: Internal Medicine

## 2022-06-10 NOTE — Telephone Encounter (Unsigned)
Copied from [place] [id number]. Topic: Medicare AWV >> Jun 10, 2022 10:36 AM Jae Dire wrote: Reason for CRM:  Left message for patient to call back and schedule Medicare Annual Wellness Visit (AWV) in office.   If unable to come into the office for AWV,  please offer to do virtually or by telephone.  Last AWV:  06/12/2021  Please schedule at anytime with [facility] Health Advisor.      30 minute appointment for Virtual or phone 45 minute appointment for in office or Initial virtual/phone  Any questions, please call me at [phone]

## 2022-06-10 NOTE — Telephone Encounter (Signed)
Copied from [place] [id number]. Topic: Medicare AWV >> Jun 10, 2022 10:36 AM Jae Dire wrote: Reason for CRM:  Left message for patient to call back and schedule Medicare Annual Wellness Visit (AWV) in office.   If unable to come into the office for AWV,  please offer to do virtually or by telephone.  Last AWV:  06/12/2021  Please schedule at anytime with [facility] Health Advisor.      30 minute appointment for Virtual or phone 45 minute appointment for in office or Initial virtual/phone  Any questions, please call me at [phone]

## 2022-06-15 ENCOUNTER — Other Ambulatory Visit (INDEPENDENT_AMBULATORY_CARE_PROVIDER_SITE_OTHER): Payer: Self-pay | Admitting: Vascular Surgery

## 2022-06-15 ENCOUNTER — Other Ambulatory Visit: Payer: Self-pay | Admitting: Internal Medicine

## 2022-06-15 DIAGNOSIS — L819 Disorder of pigmentation, unspecified: Secondary | ICD-10-CM

## 2022-06-15 DIAGNOSIS — I10 Essential (primary) hypertension: Secondary | ICD-10-CM

## 2022-06-16 NOTE — Telephone Encounter (Signed)
Requested medication (s) are due for refill today: yes  Requested medication (s) are on the active medication list: yes  Last refill:  05/31/22 #30 0 refills  Future visit scheduled: yes in 6 days  Notes to clinic:  courtesy refill given 05/31/22 . Do you want to give 2nd courtesy refill? Requesting 90 day supply     Requested Prescriptions  Pending Prescriptions Disp Refills   olmesartan (BENICAR) 20 MG tablet [Pharmacy Med Name: OLMESARTAN MEDOXOMIL 20 MG TAB] 90 tablet 1    Sig: TAKE 1 TABLET BY MOUTH EVERY DAY     Cardiovascular:  Angiotensin Receptor Blockers Failed - 06/15/2022  7:38 PM      Failed - Cr in normal range and within 180 days    Creatinine, Ser  Date Value Ref Range Status  09/23/2021 0.62 0.57 - 1.00 mg/dL Final         Failed - K in normal range and within 180 days    Potassium  Date Value Ref Range Status  09/23/2021 5.2 3.5 - 5.2 mmol/L Final         Failed - Valid encounter within last 6 months    Recent Outpatient Visits           8 months ago Essential hypertension   [place] [facility], Jesse Sans, MD   1 year ago Raynaud'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   [place] [facility] at [facility], Jesse Sans, MD   1 year ago Essential hypertension   [place] and [facility] Medicine at [facility], Jesse Sans, MD   2 years ago Essential hypertension   [place] [facility] at [facility], Jesse Sans, MD   2 years ago Environmental and seasonal allergies   [place] [facility] at [facility], Jesse Sans, MD       Future Appointments             In 6 days Glean Hess, MD [facility] Primary Care and Sports Medicine at [facility] [place] - Patient is not pregnant      Passed - Last BP in normal range    BP Readings from Last 1 Encounters:   09/23/21 122/86

## 2022-06-20 ENCOUNTER — Encounter (INDEPENDENT_AMBULATORY_CARE_PROVIDER_SITE_OTHER): Payer: Medicare Other

## 2022-06-20 ENCOUNTER — Ambulatory Visit (INDEPENDENT_AMBULATORY_CARE_PROVIDER_SITE_OTHER): Payer: Medicare Other | Admitting: Vascular Surgery

## 2022-06-22 ENCOUNTER — Ambulatory Visit: Payer: Medicare Other | Admitting: Internal Medicine

## 2022-06-29 ENCOUNTER — Other Ambulatory Visit: Payer: Self-pay | Admitting: Internal Medicine

## 2022-06-29 NOTE — Telephone Encounter (Signed)
Requested Prescriptions  Pending Prescriptions Disp Refills  . hydrOXYzine (VISTARIL) 25 MG capsule [Pharmacy Med Name: HYDROXYZINE PAM 25 MG CAP] 270 capsule 0    Sig: TAKE 3 CAPSULES (75 MG TOTAL) BY MOUTH AT BEDTIME AS NEEDED.     Ear, Nose, and Throat:  Antihistamines 2 Passed - 06/29/2022  2:37 AM      Passed - Cr in normal range and within 360 days    Creatinine, Ser  Date Value Ref Range Status  09/23/2021 0.62 0.57 - 1.00 mg/dL Final         Passed - Valid encounter within last 12 months    Recent Outpatient Visits          9 months ago Essential hypertension   [place] [facility] at [facility], Jesse Sans, MD   1 year ago Raynaud'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   [place] [facility] at [facility], Jesse Sans, MD   1 year ago Essential hypertension   [place] [facility] at [facility], Jesse Sans, MD   2 years ago Essential hypertension   [place] [facility] at [facility] [place], Jesse Sans, MD   2 years ago Environmental and seasonal allergies    Primary Care and Sports Medicine at [facility], Jesse Sans, MD      Future Appointments            In 3 weeks Army Melia Jesse Sans, MD [place] and Sports Medicine at [facility], [facility]

## 2022-07-20 ENCOUNTER — Ambulatory Visit: Payer: Medicare Other | Admitting: Internal Medicine

## 2022-07-21 ENCOUNTER — Ambulatory Visit: Payer: Medicare Other | Admitting: Internal Medicine

## 2022-07-28 ENCOUNTER — Telehealth: Payer: Self-pay | Admitting: Internal Medicine

## 2022-07-28 NOTE — Telephone Encounter (Signed)
Copied from [place] [id number]. Topic: Medicare AWV >> Jul 28, 2022 11:46 AM Jae Dire wrote: Reason for CRM:  Left message for patient to call back and schedule Medicare Annual Wellness Visit (AWV) in office.   If unable to come into the office for AWV,  please offer to do virtually or by telephone.  Last AWV: 06/12/2021  Please schedule at any time with [facility].      30 minute appointment for Virtual or phone 45 minute appointment for in office or Initial virtual/phone  Any questions, please call me at [phone]

## 2022-07-31 DIAGNOSIS — I872 Venous insufficiency (chronic) (peripheral): Secondary | ICD-10-CM | POA: Insufficient documentation

## 2022-07-31 NOTE — Progress Notes (Deleted)
MRN : [id number]  Lori Trevino is a 71 y.o. (1951/01/27) female who presents with chief complaint of check circulation.  History of Present Illness:   The patient is seen for evaluation of painful lower extremities. Patient notes the pain is variable and not always associated with activity.  The pain is somewhat consistent day to day occurring on most days. The patient notes the pain also occurs with standing and routinely seems worse as the day wears on. The pain has been progressive over the past several years. The patient states these symptoms are causing  a negative impact on quality of life and daily activities which was a factor in the referral.  The patient has a  history of back problems and DJD of the lumbar and sacral spine.   The patient denies rest pain or dangling of an extremity off the side of the bed during the night for relief. No open wounds or sores at this time. No history of DVT or phlebitis. No prior vascular interventions or surgeries.    No outpatient medications have been marked as taking for the 08/01/22 encounter (Appointment) with Delana Meyer, Dolores Lory, MD.    Past Medical History:  Diagnosis Date   Allergy    Hypertension    Radiculopathy    Tendonitis 11/2019   right wrist and right arm    Past Surgical History:  Procedure Laterality Date   AXILLARY SURGERY Right 2008   CHOLECYSTECTOMY     CYSTOCELE REPAIR N/A 02/08/2016   Procedure: ANTERIOR REPAIR (CYSTOCELE);  Surgeon: Boykin Nearing, MD;  Location: [facility] ORS;  Service: Gynecology;  Laterality: N/A;   LAPAROSCOPIC GASTRIC SLEEVE RESECTION WITH HIATAL HERNIA REPAIR  2015   TONSILLECTOMY     TUBAL LIGATION     VAGINAL HYSTERECTOMY N/A 02/08/2016   Procedure: HYSTERECTOMY VAGINAL WITH BSO;  Surgeon: Boykin Nearing, MD;  Location: [facility] ORS;  Service: Gynecology;  Laterality: N/A;   VARICOSE VEIN SURGERY      Social History Social History   Tobacco Use   Smoking  status: Every Day    Packs/day: 0.10    Years: 60.00    Total pack years: 6.00    Types: Cigarettes   Smokeless tobacco: Never   Tobacco comments:    2-3 daily  Vaping Use   Vaping Use: Never used  Substance Use Topics   Alcohol use: Not Currently    Alcohol/week: 0.0 standard drinks of alcohol   Drug use: No    Family History Family History  Problem Relation Age of Onset   Stroke Mother    Cancer Father    Breast cancer Neg Hx     Allergies  Allergen Reactions   Neurontin [Gabapentin] Swelling   Zolpidem Other (See Comments)    sleepwalking      REVIEW OF SYSTEMS (Negative unless checked)  Constitutional: '[]'$ Weight loss  '[]'$ Fever  '[]'$ Chills Cardiac: '[]'$ Chest pain   '[]'$ Chest pressure   '[]'$ Palpitations   '[]'$ Shortness of breath when laying flat   '[]'$ Shortness of breath with exertion. Vascular:  '[x]'$ Pain in legs with walking   '[]'$ Pain in legs at rest  '[]'$ History of DVT   '[]'$ Phlebitis   '[]'$ Swelling in legs   '[]'$ Varicose veins   '[]'$ Non-healing ulcers Pulmonary:   '[]'$ Uses home oxygen   '[]'$ Productive cough   '[]'$ Hemoptysis   '[]'$ Wheeze  '[x]'$ COPD   '[]'$ Asthma Neurologic:  '[]'$ Dizziness   '[]'$ Seizures   '[]'$ History of  stroke   '[]'$ History of TIA  '[]'$ Aphasia   '[]'$ Vissual changes   '[]'$ Weakness or numbness in arm   '[]'$ Weakness or numbness in leg Musculoskeletal:   '[]'$ Joint swelling   '[x]'$ Joint pain   '[]'$ Low back pain Hematologic:  '[]'$ Easy bruising  '[]'$ Easy bleeding   '[]'$ Hypercoagulable state   '[]'$ Anemic Gastrointestinal:  '[]'$ Diarrhea   '[]'$ Vomiting  '[]'$ Gastroesophageal reflux/heartburn   '[]'$ Difficulty swallowing. Genitourinary:  '[]'$ Chronic kidney disease   '[]'$ Difficult urination  '[]'$ Frequent urination   '[]'$ Blood in urine Skin:  '[]'$ Rashes   '[]'$ Ulcers  Psychological:  '[]'$ History of anxiety   '[]'$  History of major depression.  Physical Examination  There were no vitals filed for this visit. There is no height or weight on file to calculate BMI. Gen: WD/WN, NAD Head: [place]/AT, No temporalis wasting.  Ear/Nose/Throat: Hearing grossly  intact, nares w/o erythema or drainage Eyes: PER, EOMI, sclera nonicteric.  Neck: Supple, no masses.  No bruit or JVD.  Pulmonary:  Good air movement, no audible wheezing, no use of accessory muscles.  Cardiac: RRR, normal S1, S2, no Murmurs. Vascular:  mild trophic changes, no open wounds Vessel Right Left  Radial Palpable Palpable  PT Not Palpable Not Palpable  DP Not Palpable Not Palpable  Gastrointestinal: soft, non-distended. No guarding/no peritoneal signs.  Musculoskeletal: M/S 5/5 throughout.  No visible deformity.  Neurologic: CN 2-12 intact. Pain and light touch intact in extremities.  Symmetrical.  Speech is fluent. Motor exam as listed above. Psychiatric: Judgment intact, Mood & affect appropriate for pt's clinical situation. Dermatologic: No rashes or ulcers noted.  No changes consistent with cellulitis.   CBC Lab Results  Component Value Date   WBC 5.3 09/23/2021   HGB 10.9 (L) 09/23/2021   HCT 33.0 (L) 09/23/2021   MCV 89 09/23/2021   PLT 282 09/23/2021    BMET    Component Value Date/Time   NA 135 09/23/2021 1116   K 5.2 09/23/2021 1116   CL 98 09/23/2021 1116   CO2 26 09/23/2021 1116   GLUCOSE 79 09/23/2021 1116   GLUCOSE 87 09/15/2021 0635   BUN 10 09/23/2021 1116   CREATININE 0.62 09/23/2021 1116   CALCIUM 9.1 09/23/2021 1116   GFRNONAA >60 09/15/2021 0635   GFRAA >60 12/18/2019 0829   CrCl cannot be calculated (Patient's most recent lab result is older than the maximum 21 days allowed.).  COAG No results found for: "INR", "PROTIME"  Radiology No results found.   Assessment/Plan There are no diagnoses linked to this encounter.   Hortencia Pilar, MD  07/31/2022 10:40 AM

## 2022-08-01 ENCOUNTER — Ambulatory Visit (INDEPENDENT_AMBULATORY_CARE_PROVIDER_SITE_OTHER): Payer: Medicare Other | Admitting: Vascular Surgery

## 2022-08-01 ENCOUNTER — Encounter (INDEPENDENT_AMBULATORY_CARE_PROVIDER_SITE_OTHER): Payer: Medicare Other

## 2022-08-01 ENCOUNTER — Encounter (INDEPENDENT_AMBULATORY_CARE_PROVIDER_SITE_OTHER): Payer: Self-pay

## 2022-08-01 DIAGNOSIS — E782 Mixed hyperlipidemia: Secondary | ICD-10-CM

## 2022-08-01 DIAGNOSIS — I739 Peripheral vascular disease, unspecified: Secondary | ICD-10-CM

## 2022-08-01 DIAGNOSIS — I10 Essential (primary) hypertension: Secondary | ICD-10-CM

## 2022-08-01 DIAGNOSIS — J4489 Other specified chronic obstructive pulmonary disease: Secondary | ICD-10-CM

## 2022-08-01 DIAGNOSIS — I872 Venous insufficiency (chronic) (peripheral): Secondary | ICD-10-CM

## 2022-08-03 ENCOUNTER — Ambulatory Visit (INDEPENDENT_AMBULATORY_CARE_PROVIDER_SITE_OTHER): Payer: Medicare Other

## 2022-08-03 VITALS — Ht 61.0 in | Wt 186.0 lb

## 2022-08-03 DIAGNOSIS — Z0100 Encounter for examination of eyes and vision without abnormal findings: Secondary | ICD-10-CM

## 2022-08-03 DIAGNOSIS — Z0000 Encounter for general adult medical examination without abnormal findings: Secondary | ICD-10-CM

## 2022-08-03 DIAGNOSIS — Z780 Asymptomatic menopausal state: Secondary | ICD-10-CM

## 2022-08-03 DIAGNOSIS — Z1231 Encounter for screening mammogram for malignant neoplasm of breast: Secondary | ICD-10-CM

## 2022-08-03 NOTE — Progress Notes (Signed)
Subjective:   Lori Trevino is a 71 y.o. female who presents for Medicare Annual (Subsequent) preventive examination.  I connected with  Lori Trevino on 08/03/22 by a audio enabled telemedicine application and verified that I am speaking with the correct person using two identifiers.  Patient Location: Home  Provider Location: Office/Clinic  I discussed the limitations of evaluation and management by telemedicine. The patient expressed understanding and agreed to proceed.   Review of Systems    Defer to PCP Cardiac Risk Factors include: advanced age (>55mn, >>78women)     Objective:    Today's Vitals   08/03/22 1056  Weight: 186 lb (84.4 kg)  Height: '5\' 1"'$  (1.549 m)  PainSc: 0-No pain   Body mass index is 35.14 kg/m.     08/03/2022   11:04 AM 09/12/2021    8:05 PM 06/12/2021    9:58 AM 10/02/2020    8:32 AM 06/27/2020    8:22 AM 11/18/2019    1:43 PM 10/07/2017    8:13 AM  Advanced Directives  Does Patient Have a Medical Advance Directive? No No No No No No No  Would patient like information on creating a medical advance directive? No - Patient declined No - Patient declined No - Patient declined   Yes (MAU/Ambulatory/Procedural Areas - Information given)     Current Medications (verified) Outpatient Encounter Medications as of 08/03/2022  Medication Sig   albuterol (VENTOLIN HFA) 108 (90 Base) MCG/ACT inhaler TAKE 2 PUFFS BY MOUTH EVERY 6 HOURS AS NEEDED FOR WHEEZE OR SHORTNESS OF BREATH   B Complex-Biotin-FA (B-COMPLEX PO) Take 1 tablet by mouth daily.   busPIRone (BUSPAR) 15 MG tablet Take 15 mg by mouth 3 (three) times daily.   cyclobenzaprine (FLEXERIL) 10 MG tablet TAKE 1/2-1 TABLET BY MOUTH 3 TIMES A DAY AS NEEDED   fluticasone (FLONASE) 50 MCG/ACT nasal spray PLACE 1 SPRAY INTO BOTH NOSTRILS DAILY.   furosemide (LASIX) 20 MG tablet Take 1 tablet (20 mg total) by mouth daily as needed.   hydrOXYzine (VISTARIL) 25 MG capsule TAKE 3 CAPSULES (75 MG TOTAL) BY MOUTH  AT BEDTIME AS NEEDED.   loratadine (CLARITIN) 10 MG tablet Take 10 mg by mouth daily as needed for allergies.   methadone (DOLOPHINE) 1 mg/ml oral solution Take by mouth daily. 100 mg   Multiple Vitamin (ONE-A-DAY 55 PLUS PO) Take 1 tablet by mouth daily.   olmesartan (BENICAR) 20 MG tablet TAKE 1 TABLET BY MOUTH EVERY DAY   ondansetron (ZOFRAN-ODT) 8 MG disintegrating tablet TAKE 1 TABLET (8 MG TOTAL) BY MOUTH EVERY 8 (EIGHT) HOURS AS NEEDED FOR NAUSEA OR VOMITING.   pantoprazole (PROTONIX) 40 MG tablet Take 1 tablet (40 mg total) by mouth daily.   promethazine-dextromethorphan (PROMETHAZINE-DM) 6.25-15 MG/5ML syrup Take 5 mLs by mouth 4 (four) times daily as needed for cough.   [DISCONTINUED] amLODipine (NORVASC) 5 MG tablet TAKE 1 TABLET (5 MG TOTAL) BY MOUTH DAILY. FOR HIGH BLOOD PRESSURE   No facility-administered encounter medications on file as of 08/03/2022.    Allergies (verified) Neurontin [gabapentin] and Zolpidem   History: Past Medical History:  Diagnosis Date   Allergy    Hypertension    Radiculopathy    Tendonitis 11/2019   right wrist and right arm   Past Surgical History:  Procedure Laterality Date   AXILLARY SURGERY Right 2008   CHOLECYSTECTOMY     CYSTOCELE REPAIR N/A 02/08/2016   Procedure: ANTERIOR REPAIR (CYSTOCELE);  Surgeon: TBoykin Nearing MD;  Location: [facility] ORS;  Service: Gynecology;  Laterality: N/A;   LAPAROSCOPIC GASTRIC SLEEVE RESECTION WITH HIATAL HERNIA REPAIR  2015   TONSILLECTOMY     TUBAL LIGATION     VAGINAL HYSTERECTOMY N/A 02/08/2016   Procedure: HYSTERECTOMY VAGINAL WITH BSO;  Surgeon: Boykin Nearing, MD;  Location: [facility] ORS;  Service: Gynecology;  Laterality: N/A;   VARICOSE VEIN SURGERY     Family History  Problem Relation Age of Onset   Stroke Mother    Cancer Father    Breast cancer Neg Hx    Social History   Socioeconomic History   Marital status: Divorced    Spouse name: N/A   Number of children: 0   Years of  education: 12   Highest education level: Some college, no degree  Occupational History   Not on file  Tobacco Use   Smoking status: Every Day    Packs/day: 0.10    Years: 60.00    Total pack years: 6.00    Types: Cigarettes   Smokeless tobacco: Never   Tobacco comments:    2-3 daily  Vaping Use   Vaping Use: Never used  Substance and Sexual Activity   Alcohol use: Not Currently    Alcohol/week: 0.0 standard drinks of alcohol   Drug use: No   Sexual activity: Yes  Other Topics Concern   Not on file  Social History Narrative   Not on file   Social Determinants of Health   Financial Resource Strain: Low Risk  (08/03/2022)   Overall Financial Resource Strain (CARDIA)    Difficulty of Paying Living Expenses: Not hard at all  Food Insecurity: No Food Insecurity (08/03/2022)   Hunger Vital Sign    Worried About Running Out of Food in the Last Year: Never true    Ran Out of Food in the Last Year: Never true  Transportation Needs: No Transportation Needs (08/03/2022)   PRAPARE - Hydrologist (Medical): No    Lack of Transportation (Non-Medical): No  Physical Activity: Inactive (08/03/2022)   Exercise Vital Sign    Days of Exercise per Week: 0 days    Minutes of Exercise per Session: 0 min  Stress: No Stress Concern Present (08/03/2022)   [place]    Feeling of Stress : Only a little  Social Connections: Socially Isolated (08/03/2022)   Social Connection and Isolation Panel [NHANES]    Frequency of Communication with Friends and Family: Three times a week    Frequency of Social Gatherings with Friends and Family: Three times a week    Attends Religious Services: Never    Active Member of Clubs or Organizations: No    Attends Archivist Meetings: Never    Marital Status: Divorced    Tobacco Counseling Ready to quit: Not Answered Counseling given: Not Answered Tobacco  comments: 2-3 daily   Clinical Intake:  Pre-visit preparation completed: Yes  Pain Score: 0-No pain     BMI - recorded: 35.14 Nutritional Status: BMI > 30  Obese Nutritional Risks: None Diabetes: No     Diabetic? No.     Information entered by :: Wyatt Haste, [place] of Daily Living    08/03/2022   11:17 AM 09/23/2021   10:46 AM  In your present state of health, do you have any difficulty performing the following activities:  Hearing? 0 1  Vision? 0 0  Difficulty concentrating or making decisions?  0 0  Walking or climbing stairs? 1 1  Dressing or bathing? 0 0  Doing errands, shopping? 0 0  Preparing Food and eating ? N   Using the Toilet? N   In the past six months, have you accidently leaked urine? N   Do you have problems with loss of bowel control? N   Managing your Medications? N   Managing your Finances? N   Housekeeping or managing your Housekeeping? N     Patient Care Team: Glean Hess, MD as PCP - General (Internal Medicine) Sharlet Salina, MD as Referring Physician (Physical Medicine and Rehabilitation) Beverly Gust, MD (Otolaryngology) Randol Kern, MD (Psychiatry)  Indicate any recent Medical Services you may have received from other than [facility] providers in the past year (date may be approximate).     Assessment:   This is a routine wellness examination for Lori Trevino.  Hearing/Vision screen Hearing Screening - Comments:: No concerns. Vision Screening - Comments:: Wears glasses.  Dietary issues and exercise activities discussed: Current Exercise Habits: The patient does not participate in regular exercise at present   Goals Addressed   None   Depression Screen    08/03/2022   11:03 AM 09/23/2021   10:45 AM 06/12/2021    9:20 AM 12/17/2020    8:38 AM 08/25/2020    9:43 AM 02/18/2020    1:36 PM 02/06/2020   10:31 AM  PHQ 2/9 Scores  PHQ - 2 Score 0 0 0 0 '2 4 4  '$ PHQ- 9 Score 0 0 0 '3 8 10 9    '$ Fall Risk     08/03/2022   11:17 AM 09/23/2021   10:46 AM 06/12/2021    9:29 AM 12/17/2020    8:38 AM 08/25/2020    9:42 AM  Fall Risk   Falls in the past year? 1 1 0 0 0  Number falls in past yr: 1 1 0  0  Injury with Fall? 0 0 0  0  Risk for fall due to : History of fall(s) No Fall Risks No Fall Risks  No Fall Risks  Follow up Falls evaluation completed Falls evaluation completed Falls evaluation completed Falls evaluation completed Follow up appointment    [place]:  Any stairs in or around the home? Yes  If so, are there any without handrails? Yes  Home free of loose throw rugs in walkways, pet beds, electrical cords, etc? Yes  Adequate lighting in your home to reduce risk of falls? Yes   ASSISTIVE DEVICES UTILIZED TO PREVENT FALLS:  Life alert? No  Use of a cane, walker or w/c? Yes  Grab bars in the bathroom? No  Shower chair or bench in shower? No  Elevated toilet seat or a handicapped toilet? No   Cognitive Function:        08/03/2022   11:17 AM 06/12/2021    9:40 AM  6CIT Screen  What Year? 0 points 0 points  What month? 0 points 0 points  What time? 0 points 0 points  Count back from 20 0 points 0 points  Months in reverse 0 points 0 points  Repeat phrase 0 points 0 points  Total Score 0 points 0 points    Immunizations Immunization History  Administered Date(s) Administered   Influenza, High Dose Seasonal PF 10/27/2017   Influenza-Unspecified 09/08/2015, 10/22/2016   Moderna Sars-Covid-2 Vaccination 12/28/2019, 01/25/2020, 09/15/2020   Pneumococcal Conjugate-13 10/27/2017   Tdap 07/16/2014    TDAP status: Up  to date  Flu Vaccine status: Due, Education has been provided regarding the importance of this vaccine. Advised may receive this vaccine at local pharmacy or Health Dept. Aware to provide a copy of the vaccination record if obtained from local pharmacy or Health Dept. Verbalized acceptance and understanding.  Pneumococcal  vaccine status: Due, Education has been provided regarding the importance of this vaccine. Advised may receive this vaccine at local pharmacy or Health Dept. Aware to provide a copy of the vaccination record if obtained from local pharmacy or Health Dept. Verbalized acceptance and understanding.  Covid-19 vaccine status: Completed vaccines  Qualifies for Shingles Vaccine? Yes   Zostavax completed No   Shingrix Completed?: No.    Education has been provided regarding the importance of this vaccine. Patient has been advised to call insurance company to determine out of pocket expense if they have not yet received this vaccine. Advised may also receive vaccine at local pharmacy or Health Dept. Verbalized acceptance and understanding.  Screening Tests Health Maintenance  Topic Date Due   Hepatitis C Screening  Never done   DEXA SCAN  Never done   MAMMOGRAM  06/16/2017   Pneumonia Vaccine 47+ Years old (2 - PPSV23 or PCV20) 12/22/2017   COVID-19 Vaccine (4 - Moderna series) 08/19/2022 (Originally 11/10/2020)   Zoster Vaccines- Shingrix (1 of 2) 11/03/2022 (Originally 05/08/1999)   INFLUENZA VACCINE  01/01/2023 (Originally 05/03/2022)   Fecal DNA (Cologuard)  12/11/2022   Medicare Annual Wellness (AWV)  08/04/2023   TETANUS/TDAP  10/27/2025   HPV VACCINES  Aged Out    Health Maintenance  Health Maintenance Due  Topic Date Due   Hepatitis C Screening  Never done   DEXA SCAN  Never done   MAMMOGRAM  06/16/2017   Pneumonia Vaccine 47+ Years old (2 - PPSV23 or PCV20) 12/22/2017    Colorectal cancer screening: Type of screening: Cologuard. Completed 12/11/2019. Repeat every 3 years  Mammogram status: Ordered TODAY. Pt provided with contact info and advised to call to schedule appt.   Bone Density status: Ordered TODAY. Pt provided with contact info and advised to call to schedule appt.  Lung Cancer Screening: (Low Dose CT Chest recommended if Age 56-80 years, 30 pack-year currently smoking OR  have quit w/in 15years.) does qualify.   Lung Cancer Screening Referral: Pt Declined.  Additional Screening:  Hepatitis C Screening: does qualify; DUE  Vision Screening: Recommended annual ophthalmology exams for early detection of glaucoma and other disorders of the eye. Is the patient up to date with their annual eye exam?  Yes  If pt is not established with a provider, would they like to be referred to a provider to establish care?  Referral Placed .   Dental Screening: Recommended annual dental exams for proper oral hygiene  Community Resource Referral / Chronic Care Management: CRR required this visit?  No   CCM required this visit?  No      Plan:     I have personally reviewed and noted the following in the patient's chart:   Medical and social history Use of alcohol, tobacco or illicit drugs  Current medications and supplements including opioid prescriptions. Patient is not currently taking opioid prescriptions. Functional ability and status Nutritional status Physical activity Advanced directives List of other physicians Hospitalizations, surgeries, and ER visits in previous 12 months Vitals Screenings to include cognitive, depression, and falls Referrals and appointments  In addition, I have reviewed and discussed with patient certain preventive protocols, quality metrics, and best  practice recommendations. A written personalized care plan for preventive services as well as general preventive health recommendations were provided to patient.     Clista Bernhardt, [place]   08/03/2022    Ms. [facility] , Thank you for taking time to come for your Medicare Wellness Visit. I appreciate your ongoing commitment to your health goals. Please review the following plan we discussed and let me know if I can assist you in the future.   These are the goals we discussed:  Goals      DIET - INCREASE WATER INTAKE     Recommend drinking 6-8 glasses of water per day         This is a list of the screening recommended for you and due dates:  Health Maintenance  Topic Date Due   Hepatitis C Screening: USPSTF Recommendation to screen - Ages 47-79 yo.  Never done   DEXA scan (bone density measurement)  Never done   Mammogram  06/16/2017   Pneumonia Vaccine (2 - PPSV23 or PCV20) 12/22/2017   COVID-19 Vaccine (4 - Moderna series) 08/19/2022*   Zoster (Shingles) Vaccine (1 of 2) 11/03/2022*   Flu Shot  01/01/2023*   Cologuard (Stool DNA test)  12/11/2022   Medicare Annual Wellness Visit  08/04/2023   Tetanus Vaccine  10/27/2025   HPV Vaccine  Aged Out  *Topic was postponed. The date shown is not the original due date.      Nurse Notes: Ordered Mammogram and DEXA today. Due for Hep C Screening, and Pneumo vaccine.

## 2022-08-03 NOTE — Patient Instructions (Signed)

## 2022-08-11 ENCOUNTER — Ambulatory Visit: Payer: Medicare Other | Admitting: Internal Medicine

## 2022-08-12 ENCOUNTER — Ambulatory Visit: Payer: Medicare Other | Admitting: Internal Medicine

## 2022-09-08 ENCOUNTER — Encounter (INDEPENDENT_AMBULATORY_CARE_PROVIDER_SITE_OTHER): Payer: Medicare Other

## 2022-09-08 ENCOUNTER — Ambulatory Visit (INDEPENDENT_AMBULATORY_CARE_PROVIDER_SITE_OTHER): Payer: Medicare Other | Admitting: Vascular Surgery

## 2022-09-08 ENCOUNTER — Other Ambulatory Visit: Payer: Self-pay | Admitting: Internal Medicine

## 2022-09-08 DIAGNOSIS — I10 Essential (primary) hypertension: Secondary | ICD-10-CM

## 2022-09-08 NOTE — Telephone Encounter (Signed)
Requested Prescriptions  Pending Prescriptions Disp Refills   olmesartan (BENICAR) 20 MG tablet [Pharmacy Med Name: OLMESARTAN MEDOXOMIL 20 MG TAB] 14 tablet 0    Sig: TAKE 1 TABLET BY MOUTH EVERY DAY     Cardiovascular:  Angiotensin Receptor Blockers Failed - 09/08/2022  2:51 AM      Failed - Cr in normal range and within 180 days    Creatinine, Ser  Date Value Ref Range Status  09/23/2021 0.62 0.57 - 1.00 mg/dL Final         Failed - K in normal range and within 180 days    Potassium  Date Value Ref Range Status  09/23/2021 5.2 3.5 - 5.2 mmol/L Final         Passed - Patient is not pregnant      Passed - Last BP in normal range    BP Readings from Last 1 Encounters:  09/23/21 122/86         Passed - Valid encounter within last 6 months    Recent Outpatient Visits           11 months ago Essential hypertension   [place] [facility] at [facility], Jesse Sans, MD   1 year ago Raynaud'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   [place] [facility] at [facility], Jesse Sans, MD   2 years ago Essential hypertension   [place] [facility] at [facility], Jesse Sans, MD   2 years ago Essential hypertension   [place] [facility] at [facility], Jesse Sans, MD   2 years ago Environmental and seasonal allergies   [place] [facility] at [facility], Jesse Sans, MD       Future Appointments             In 5 days Army Melia Jesse Sans, MD [facility] at [facility], [place] refill.

## 2022-09-13 ENCOUNTER — Ambulatory Visit
Admission: RE | Admit: 2022-09-13 | Discharge: 2022-09-13 | Disposition: A | Discharge status: Home / Self Care | Payer: Medicare Other | Attending: Internal Medicine | Admitting: Internal Medicine

## 2022-09-13 ENCOUNTER — Encounter: Payer: Self-pay | Admitting: Internal Medicine

## 2022-09-13 ENCOUNTER — Ambulatory Visit (INDEPENDENT_AMBULATORY_CARE_PROVIDER_SITE_OTHER): Payer: Medicare Other | Admitting: Internal Medicine

## 2022-09-13 ENCOUNTER — Ambulatory Visit
Admission: RE | Admit: 2022-09-13 | Discharge: 2022-09-13 | Disposition: A | Discharge status: Home / Self Care | Payer: Medicare Other | Source: Ambulatory Visit | Attending: Internal Medicine | Admitting: Internal Medicine

## 2022-09-13 VITALS — BP 126/78 | HR 74 | Ht 61.0 in | Wt 196.0 lb

## 2022-09-13 DIAGNOSIS — F331 Major depressive disorder, recurrent, moderate: Secondary | ICD-10-CM

## 2022-09-13 DIAGNOSIS — K219 Gastro-esophageal reflux disease without esophagitis: Secondary | ICD-10-CM | POA: Diagnosis not present

## 2022-09-13 DIAGNOSIS — G8929 Other chronic pain: Secondary | ICD-10-CM

## 2022-09-13 DIAGNOSIS — Z1159 Encounter for screening for other viral diseases: Secondary | ICD-10-CM

## 2022-09-13 DIAGNOSIS — I739 Peripheral vascular disease, unspecified: Secondary | ICD-10-CM

## 2022-09-13 DIAGNOSIS — Z23 Encounter for immunization: Secondary | ICD-10-CM | POA: Diagnosis not present

## 2022-09-13 DIAGNOSIS — J4489 Other specified chronic obstructive pulmonary disease: Secondary | ICD-10-CM | POA: Diagnosis not present

## 2022-09-13 DIAGNOSIS — F419 Anxiety disorder, unspecified: Secondary | ICD-10-CM

## 2022-09-13 DIAGNOSIS — M25571 Pain in right ankle and joints of right foot: Secondary | ICD-10-CM | POA: Diagnosis not present

## 2022-09-13 DIAGNOSIS — D485 Neoplasm of uncertain behavior of skin: Secondary | ICD-10-CM

## 2022-09-13 DIAGNOSIS — E785 Hyperlipidemia, unspecified: Secondary | ICD-10-CM

## 2022-09-13 DIAGNOSIS — I10 Essential (primary) hypertension: Secondary | ICD-10-CM | POA: Diagnosis not present

## 2022-09-13 DIAGNOSIS — Z1231 Encounter for screening mammogram for malignant neoplasm of breast: Secondary | ICD-10-CM

## 2022-09-13 MED ORDER — OLMESARTAN MEDOXOMIL 20 MG PO TABS: 20.0000 mg | ORAL_TABLET | Freq: Every day | ORAL | 1 refills | Status: DC

## 2022-09-13 NOTE — Patient Instructions (Signed)
Schedule your Mammogram and DEXA at [facility]. at [phone].

## 2022-09-13 NOTE — Progress Notes (Signed)
Date:  09/13/2022   Name:  Lori Trevino   DOB:  1951-08-25   MRN:  [id number]   Chief Complaint: Hypertension, skin check (Wants to see a derm doctor for skin check. Patient has bumps on her back.), and Ankle Injury (Lump on right ankle. X2 months. Getting worse. Swelling. )  Hypertension This is a chronic problem. The problem is controlled. Associated symptoms include shortness of breath. Pertinent negatives include no chest pain, headaches or palpitations. Past treatments include diuretics and angiotensin blockers. The current treatment provides significant improvement. Hypertensive end-organ damage includes PVD. There is no history of kidney disease, CAD/MI, CVA or heart failure.  Depression        This is a chronic problem.The problem is unchanged.  Associated symptoms include no fatigue and no headaches.  Treatments tried: Buspar. Gastroesophageal Reflux She complains of heartburn. She reports no abdominal pain, no chest pain, no coughing or no wheezing. This is a recurrent problem. The problem occurs occasionally. Pertinent negatives include no fatigue. She has tried an antacid for the symptoms. The treatment provided significant relief.  Shortness of Breath This is a chronic problem. Pertinent negatives include no abdominal pain, chest pain, headaches, leg swelling or wheezing. Her past medical history is significant for COPD. There is no history of a heart failure.    Lab Results  Component Value Date   NA 135 09/23/2021   K 5.2 09/23/2021   CO2 26 09/23/2021   GLUCOSE 79 09/23/2021   BUN 10 09/23/2021   CREATININE 0.62 09/23/2021   CALCIUM 9.1 09/23/2021   EGFR 95 09/23/2021   GFRNONAA >60 09/15/2021   Lab Results  Component Value Date   CHOL 244 (H) 10/27/2017   HDL 116 10/27/2017   LDLCALC 116 (H) 10/27/2017   TRIG 59 10/27/2017   CHOLHDL 2.1 10/27/2017   Lab Results  Component Value Date   TSH 0.516 09/13/2021   No results found for: "HGBA1C" Lab Results   Component Value Date   WBC 5.3 09/23/2021   HGB 10.9 (L) 09/23/2021   HCT 33.0 (L) 09/23/2021   MCV 89 09/23/2021   PLT 282 09/23/2021   Lab Results  Component Value Date   ALT 14 09/12/2021   AST 19 09/12/2021   ALKPHOS 74 09/12/2021   BILITOT 0.8 09/12/2021   Lab Results  Component Value Date   VD25OH 32.01 09/13/2021     Review of Systems  Constitutional:  Negative for fatigue and unexpected weight change.  HENT:  Negative for nosebleeds.   Eyes:  Negative for visual disturbance.  Respiratory:  Positive for shortness of breath. Negative for cough, chest tightness and wheezing.   Cardiovascular:  Negative for chest pain, palpitations and leg swelling.  Gastrointestinal:  Positive for heartburn. Negative for abdominal pain, constipation and diarrhea.  Neurological:  Negative for dizziness, weakness, light-headedness and headaches.  Psychiatric/Behavioral:  Positive for depression.     Patient Active Problem List   Diagnosis Date Noted   Gastroesophageal reflux disease 09/13/2022   Chronic venous insufficiency 07/31/2022   Generalized weakness 09/13/2021   Raynaud'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 12/17/2020   PAD (peripheral artery disease) ([place]) 12/17/2020   Pedal edema 02/26/2020   Sensorineural hearing loss (SNHL) of both ears 09/17/2019   Essential hypertension 08/23/2019   Tinnitus of both ears 08/23/2019   Elevated glucose level 02/05/2018   Gait instability 10/27/2017   Environmental and seasonal allergies 02/14/2017   Anxiety about health 12/10/2015   Mixed hyperlipidemia 12/08/2015  Body mass index (BMI) greater than 25 06/09/2015   Chronic pain 05/02/2015   Osteoarthritis 06/16/2014   Radiculopathy of lumbar region 06/16/2014   COPD with chronic bronchitis 06/12/2014   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[place]) 06/12/2014   Chronic insomnia 06/12/2014   Vitamin D deficiency 05/31/2014   Neuralgia and neuritis 06/25/2013    Arthritis, midfoot 06/18/2013    Allergies  Allergen Reactions   Neurontin [Gabapentin] Swelling   Zolpidem Other (See Comments)    sleepwalking     Past Surgical History:  Procedure Laterality Date   AXILLARY SURGERY Right 2008   CHOLECYSTECTOMY     CYSTOCELE REPAIR N/A 02/08/2016   Procedure: ANTERIOR REPAIR (CYSTOCELE);  Surgeon: Boykin Nearing, MD;  Location: [facility] ORS;  Service: Gynecology;  Laterality: N/A;   LAPAROSCOPIC GASTRIC SLEEVE RESECTION WITH HIATAL HERNIA REPAIR  2015   TONSILLECTOMY     TUBAL LIGATION     VAGINAL HYSTERECTOMY N/A 02/08/2016   Procedure: HYSTERECTOMY VAGINAL WITH BSO;  Surgeon: Boykin Nearing, MD;  Location: [facility] ORS;  Service: Gynecology;  Laterality: N/A;   VARICOSE VEIN SURGERY      Social History   Tobacco Use   Smoking status: Every Day    Packs/day: 0.10    Years: 60.00    Total pack years: 6.00    Types: Cigarettes   Smokeless tobacco: Never   Tobacco comments:    2-3 daily  Vaping Use   Vaping Use: Never used  Substance Use Topics   Alcohol use: Not Currently    Alcohol/week: 0.0 standard drinks of alcohol   Drug use: No     Medication list has been reviewed and updated.  Current Meds  Medication Sig   albuterol (VENTOLIN HFA) 108 (90 Base) MCG/ACT inhaler TAKE 2 PUFFS BY MOUTH EVERY 6 HOURS AS NEEDED FOR WHEEZE OR SHORTNESS OF BREATH   B Complex-Biotin-FA (B-COMPLEX PO) Take 1 tablet by mouth daily.   cyclobenzaprine (FLEXERIL) 10 MG tablet TAKE 1/2-1 TABLET BY MOUTH 3 TIMES A DAY AS NEEDED   fluticasone (FLONASE) 50 MCG/ACT nasal spray PLACE 1 SPRAY INTO BOTH NOSTRILS DAILY.   hydrOXYzine (VISTARIL) 25 MG capsule TAKE 3 CAPSULES (75 MG TOTAL) BY MOUTH AT BEDTIME AS NEEDED.   loratadine (CLARITIN) 10 MG tablet Take 10 mg by mouth daily as needed for allergies.   methadone (DOLOPHINE) 1 mg/ml oral solution Take by mouth daily. 100 mg   Multiple Vitamin (ONE-A-DAY 55 PLUS PO) Take 1 tablet by mouth daily.    ondansetron (ZOFRAN-ODT) 8 MG disintegrating tablet TAKE 1 TABLET (8 MG TOTAL) BY MOUTH EVERY 8 (EIGHT) HOURS AS NEEDED FOR NAUSEA OR VOMITING.   pantoprazole (PROTONIX) 40 MG tablet Take 1 tablet (40 mg total) by mouth daily.   [DISCONTINUED] busPIRone (BUSPAR) 15 MG tablet Take 15 mg by mouth 3 (three) times daily.   [DISCONTINUED] furosemide (LASIX) 20 MG tablet Take 1 tablet (20 mg total) by mouth daily as needed.   [DISCONTINUED] olmesartan (BENICAR) 20 MG tablet TAKE 1 TABLET BY MOUTH EVERY DAY       09/13/2022    2:42 PM 09/23/2021   10:46 AM 06/12/2021    9:35 AM 12/17/2020    8:39 AM  GAD 7 : Generalized Anxiety Score  Nervous, Anxious, on Edge 0 0 0 1  Control/stop worrying 0 0 0 1  Worry too much - different things 0 0 0 1  Trouble relaxing 0 0 0 0  Restless 0 0  0 0  Easily annoyed or irritable 0 0 0 0  Afraid - awful might happen 0 0 0 0  Total GAD 7 Score 0 0 0 3  Anxiety Difficulty Not difficult at all Not difficult at all Not difficult at all        09/13/2022    2:42 PM 08/03/2022   11:03 AM 09/23/2021   10:45 AM  Depression screen PHQ 2/9  Decreased Interest 0 0 0  Down, Depressed, Hopeless 0 0 0  PHQ - 2 Score 0 0 0  Altered sleeping 0 0 0  Tired, decreased energy 0 0 0  Change in appetite 0 0 0  Feeling bad or failure about yourself  0 0 0  Trouble concentrating 0 0 0  Moving slowly or fidgety/restless 0 0 0  Suicidal thoughts 0 0 0  PHQ-9 Score 0 0 0  Difficult doing work/chores Not difficult at all Not difficult at all Not difficult at all    BP Readings from Last 3 Encounters:  09/13/22 126/78  09/23/21 122/86  09/15/21 133/81    Physical Exam Vitals and nursing note reviewed.  Constitutional:      General: She is not in acute distress.    Appearance: Normal appearance. She is well-developed.  HENT:     Head: Normocephalic and atraumatic.  Neck:     Vascular: No carotid bruit.  Cardiovascular:     Rate and Rhythm: Normal rate and regular  rhythm.     Heart sounds: No murmur heard.    Comments: Unable to palpate DP or PT Pulmonary:     Effort: Pulmonary effort is normal. No respiratory distress.     Breath sounds: No wheezing or rhonchi.  Musculoskeletal:        General: Swelling (right ankle) present.     Cervical back: Normal range of motion.     Right lower leg: No edema.     Left lower leg: No edema.     Right ankle: Swelling (bony swelling) present. Decreased range of motion.     Right Achilles Tendon: Normal.     Left ankle:     Left Achilles Tendon: Normal.  Lymphadenopathy:     Cervical: No cervical adenopathy.  Skin:    General: Skin is warm and dry.     Capillary Refill: Capillary refill takes less than 2 seconds.     Findings: No rash.  Neurological:     General: No focal deficit present.     Mental Status: She is alert and oriented to person, place, and time.  Psychiatric:        Mood and Affect: Mood normal.        Behavior: Behavior normal.     Wt Readings from Last 3 Encounters:  09/13/22 196 lb (88.9 kg)  08/03/22 186 lb (84.4 kg)  09/23/21 186 lb (84.4 kg)    BP 126/78   Pulse 74   Ht _0  (1.549 m)   Wt 196 lb (88.9 kg)   SpO2 95%   BMI 37.03 kg/m   Assessment and Plan: 1. Essential hypertension Clinically stable exam with well controlled BP. Tolerating medications without side effects at this time. Pt to continue current regimen and low sodium diet; benefits of regular exercise as able discussed. - Comprehensive metabolic panel - TSH - olmesartan (BENICAR) 20 MG tablet; Take 1 tablet (20 mg total) by mouth daily.  Dispense: 90 tablet; Refill: 1  2. Gastroesophageal reflux disease, unspecified whether esophagitis present  Symptoms well controlled with only occasional sym No red flag signs such as weight loss, n/v, melena Will continue TUMS as needed. - CBC with Differential/Platelet  3. COPD with chronic bronchitis unchanged  4. Recurrent moderate major depressive disorder  with anxiety (HCC) Clinically stable with good control of symptoms, No SI or HI. Previously on Buspar but weaned off.  5. Need for hepatitis C screening test - Hepatitis C antibody  6. Need for vaccination for pneumococcus - Pneumococcal conjugate vaccine 20-valent  7. Mild hyperlipidemia Check labs - Lipid panel  8. Arterial insufficiency of lower extremity (HCC) Seen by [facility]; no treatment available  9. Neoplasm of uncertain behavior of skin - Ambulatory referral to Dermatology  10. Chronic pain of right ankle Suspect this is OA but will get imaging to rule out occult fracture - DG Ankle Complete Right  11. Encounter for screening mammogram for breast cancer Pt reminded to schedule mammogram and DEXA   Partially dictated using Editor, commissioning. Any errors are unintentional.  Halina Maidens, MD [facility]  09/13/2022

## 2022-09-14 LAB — COMPREHENSIVE METABOLIC PANEL
ALT: 13 IU/L (ref 0–32)
AST: 19 IU/L (ref 0–40)
Albumin/Globulin Ratio: 2.1 (ref 1.2–2.2)
Albumin: 4.4 g/dL (ref 3.8–4.8)
Alkaline Phosphatase: 103 IU/L (ref 44–121)
BUN/Creatinine Ratio: 17 (ref 12–28)
BUN: 11 mg/dL (ref 8–27)
Bilirubin Total: 0.2 mg/dL (ref 0.0–1.2)
CO2: 23 mmol/L (ref 20–29)
Calcium: 9.3 mg/dL (ref 8.7–10.3)
Chloride: 97 mmol/L (ref 96–106)
Creatinine, Ser: 0.63 mg/dL (ref 0.57–1.00)
Globulin, Total: 2.1 g/dL (ref 1.5–4.5)
Glucose: 78 mg/dL (ref 70–99)
Potassium: 4.5 mmol/L (ref 3.5–5.2)
Sodium: 134 mmol/L (ref 134–144)
Total Protein: 6.5 g/dL (ref 6.0–8.5)
eGFR: 94 mL/min/{1.73_m2} (ref >59)

## 2022-09-14 LAB — CBC WITH DIFFERENTIAL/PLATELET
Basophils Absolute: 0 10*3/uL (ref 0.0–0.2)
Basos: 1 %
EOS (ABSOLUTE): 0.3 10*3/uL (ref 0.0–0.4)
Eos: 6 %
Hematocrit: 32.6 % — ABNORMAL LOW (ref 34.0–46.6)
Hemoglobin: 10.7 g/dL — ABNORMAL LOW (ref 11.1–15.9)
Immature Grans (Abs): 0 10*3/uL (ref 0.0–0.1)
Immature Granulocytes: 0 %
Lymphocytes Absolute: 1.4 10*3/uL (ref 0.7–3.1)
Lymphs: 28 %
MCH: 29.7 pg (ref 26.6–33.0)
MCHC: 32.8 g/dL (ref 31.5–35.7)
MCV: 91 fL (ref 79–97)
Monocytes Absolute: 0.6 10*3/uL (ref 0.1–0.9)
Monocytes: 11 %
Neutrophils Absolute: 2.7 10*3/uL (ref 1.4–7.0)
Neutrophils: 54 %
Platelets: 287 10*3/uL (ref 150–450)
RBC: 3.6 x10E6/uL — ABNORMAL LOW (ref 3.77–5.28)
RDW: 13.8 % (ref 11.7–15.4)
WBC: 5 10*3/uL (ref 3.4–10.8)

## 2022-09-14 LAB — LIPID PANEL
Chol/HDL Ratio: 2.1 ratio (ref 0.0–4.4)
Cholesterol, Total: 245 mg/dL — ABNORMAL HIGH (ref 100–199)
HDL: 116 mg/dL (ref >39)
LDL Chol Calc (NIH): 116 mg/dL — ABNORMAL HIGH (ref 0–99)
Triglycerides: 78 mg/dL (ref 0–149)
VLDL Cholesterol Cal: 13 mg/dL (ref 5–40)

## 2022-09-14 LAB — HEPATITIS C ANTIBODY: Hep C Virus Ab: NONREACTIVE

## 2022-09-14 LAB — TSH: TSH: 2.29 u[IU]/mL (ref 0.450–4.500)

## 2022-11-23 ENCOUNTER — Ambulatory Visit (INDEPENDENT_AMBULATORY_CARE_PROVIDER_SITE_OTHER): Payer: Medicare Other | Admitting: Internal Medicine

## 2022-11-23 ENCOUNTER — Ambulatory Visit: Payer: Medicare Other | Admitting: Internal Medicine

## 2022-11-23 ENCOUNTER — Encounter: Payer: Self-pay | Admitting: Internal Medicine

## 2022-11-23 VITALS — BP 128/72 | HR 95 | Ht 61.0 in | Wt 204.0 lb

## 2022-11-23 DIAGNOSIS — I10 Essential (primary) hypertension: Secondary | ICD-10-CM | POA: Diagnosis not present

## 2022-11-23 DIAGNOSIS — I83891 Varicose veins of right lower extremities with other complications: Secondary | ICD-10-CM | POA: Diagnosis not present

## 2022-11-23 DIAGNOSIS — F331 Major depressive disorder, recurrent, moderate: Secondary | ICD-10-CM

## 2022-11-23 DIAGNOSIS — E782 Mixed hyperlipidemia: Secondary | ICD-10-CM | POA: Diagnosis not present

## 2022-11-23 DIAGNOSIS — F419 Anxiety disorder, unspecified: Secondary | ICD-10-CM

## 2022-11-23 NOTE — Assessment & Plan Note (Signed)
Clinically stable exam with well controlled BP on olmesartan. Tolerating medications without side effects. Pt to continue current regimen and low sodium diet.

## 2022-11-23 NOTE — Assessment & Plan Note (Signed)
Hyperlipidemia with very high HDL >100 LDL 116

## 2022-11-23 NOTE — Assessment & Plan Note (Signed)
Clinically stable with good control of symptoms, No SI or HI. Currently on no medications

## 2022-11-23 NOTE — Progress Notes (Signed)
Date:  11/23/2022   Name:  Lori Trevino   DOB:  November 11, 1950   MRN:  [id number]   Chief Complaint: Leg Injury (Right Foot bleeding. Patient has wrapped it in tape and ace bandage. )  Hypertension This is a chronic problem. The problem is controlled. Pertinent negatives include no headaches or shortness of breath. Past treatments include angiotensin blockers.  Depression        This is a chronic problem.  The problem has been resolved since onset.  Associated symptoms include no fatigue and no headaches.  Past treatments include nothing. Hyperlipidemia This is a chronic problem. Recent lipid tests were reviewed and are high (but counterbalanced by very high HDL). Pertinent negatives include no shortness of breath. Current antihyperlipidemic treatment includes diet change.  Bleeding from right foot - she nicked a varicose vein on her right foot this am while putting on compression stockings.  It bleed profusely and was still bleeding when her friend wrapped it with an ACE and duct tape.    Lab Results  Component Value Date   NA 134 09/13/2022   K 4.5 09/13/2022   CO2 23 09/13/2022   GLUCOSE 78 09/13/2022   BUN 11 09/13/2022   CREATININE 0.63 09/13/2022   CALCIUM 9.3 09/13/2022   EGFR 94 09/13/2022   GFRNONAA >60 09/15/2021   Lab Results  Component Value Date   CHOL 245 (H) 09/13/2022   HDL 116 09/13/2022   LDLCALC 116 (H) 09/13/2022   TRIG 78 09/13/2022   CHOLHDL 2.1 09/13/2022   Lab Results  Component Value Date   TSH 2.290 09/13/2022   No results found for: "HGBA1C" Lab Results  Component Value Date   WBC 5.0 09/13/2022   HGB 10.7 (L) 09/13/2022   HCT 32.6 (L) 09/13/2022   MCV 91 09/13/2022   PLT 287 09/13/2022   Lab Results  Component Value Date   ALT 13 09/13/2022   AST 19 09/13/2022   ALKPHOS 103 09/13/2022   BILITOT 0.2 09/13/2022   Lab Results  Component Value Date   VD25OH 32.01 09/13/2021     Review of Systems  Constitutional:  Negative for chills  and fatigue.  Respiratory:  Negative for chest tightness and shortness of breath.   Skin:        Extensive varicose veins LEs  Neurological:  Negative for dizziness and headaches.  Hematological:  Bruises/bleeds easily.  Psychiatric/Behavioral:  Positive for depression. Negative for dysphoric mood and sleep disturbance. The patient is not nervous/anxious.     Patient Active Problem List   Diagnosis Date Noted   Gastroesophageal reflux disease 09/13/2022   Chronic venous insufficiency 07/31/2022   Raynaud'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 12/17/2020   Pedal edema 02/26/2020   Sensorineural hearing loss (SNHL) of both ears 09/17/2019   Essential hypertension 08/23/2019   Tinnitus of both ears 08/23/2019   Gait instability 10/27/2017   Environmental and seasonal allergies 02/14/2017   Mixed hyperlipidemia 12/08/2015   Body mass index (BMI) greater than 25 06/09/2015   Radiculopathy of lumbar region 06/16/2014   COPD with chronic bronchitis 06/12/2014   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[place]) 06/12/2014   Chronic insomnia 06/12/2014   Vitamin D deficiency 05/31/2014    Allergies  Allergen Reactions   Neurontin [Gabapentin] Swelling   Zolpidem Other (See Comments)    sleepwalking     Past Surgical History:  Procedure Laterality Date   AXILLARY SURGERY Right 2008   CHOLECYSTECTOMY     CYSTOCELE REPAIR  N/A 02/08/2016   Procedure: ANTERIOR REPAIR (CYSTOCELE);  Surgeon: Boykin Nearing, MD;  Location: [facility] ORS;  Service: Gynecology;  Laterality: N/A;   LAPAROSCOPIC GASTRIC SLEEVE RESECTION WITH HIATAL HERNIA REPAIR  2015   TONSILLECTOMY     TUBAL LIGATION     VAGINAL HYSTERECTOMY N/A 02/08/2016   Procedure: HYSTERECTOMY VAGINAL WITH BSO;  Surgeon: Boykin Nearing, MD;  Location: [facility] ORS;  Service: Gynecology;  Laterality: N/A;   VARICOSE VEIN SURGERY      Social History   Tobacco Use   Smoking status: Every Day    Packs/day: 0.10    Years:  60.00    Total pack years: 6.00    Types: Cigarettes   Smokeless tobacco: Never   Tobacco comments:    2-3 daily  Vaping Use   Vaping Use: Never used  Substance Use Topics   Alcohol use: Not Currently    Alcohol/week: 0.0 standard drinks of alcohol   Drug use: No     Medication list has been reviewed and updated.  Current Meds  Medication Sig   albuterol (VENTOLIN HFA) 108 (90 Base) MCG/ACT inhaler TAKE 2 PUFFS BY MOUTH EVERY 6 HOURS AS NEEDED FOR WHEEZE OR SHORTNESS OF BREATH   B Complex-Biotin-FA (B-COMPLEX PO) Take 1 tablet by mouth daily.   cyclobenzaprine (FLEXERIL) 10 MG tablet TAKE 1/2-1 TABLET BY MOUTH 3 TIMES A DAY AS NEEDED   fluticasone (FLONASE) 50 MCG/ACT nasal spray PLACE 1 SPRAY INTO BOTH NOSTRILS DAILY.   loratadine (CLARITIN) 10 MG tablet Take 10 mg by mouth daily as needed for allergies.   methadone (DOLOPHINE) 1 mg/ml oral solution Take by mouth daily. 100 mg   Multiple Vitamin (ONE-A-DAY 55 PLUS PO) Take 1 tablet by mouth daily.   olmesartan (BENICAR) 20 MG tablet Take 1 tablet (20 mg total) by mouth daily.   ondansetron (ZOFRAN-ODT) 8 MG disintegrating tablet TAKE 1 TABLET (8 MG TOTAL) BY MOUTH EVERY 8 (EIGHT) HOURS AS NEEDED FOR NAUSEA OR VOMITING.   [DISCONTINUED] hydrOXYzine (VISTARIL) 25 MG capsule TAKE 3 CAPSULES (75 MG TOTAL) BY MOUTH AT BEDTIME AS NEEDED.   [DISCONTINUED] pantoprazole (PROTONIX) 40 MG tablet Take 1 tablet (40 mg total) by mouth daily.       09/13/2022    2:42 PM 09/23/2021   10:46 AM 06/12/2021    9:35 AM 12/17/2020    8:39 AM  GAD 7 : Generalized Anxiety Score  Nervous, Anxious, on Edge 0 0 0 1  Control/stop worrying 0 0 0 1  Worry too much - different things 0 0 0 1  Trouble relaxing 0 0 0 0  Restless 0 0 0 0  Easily annoyed or irritable 0 0 0 0  Afraid - awful might happen 0 0 0 0  Total GAD 7 Score 0 0 0 3  Anxiety Difficulty Not difficult at all Not difficult at all Not difficult at all        09/13/2022    2:42 PM  08/03/2022   11:03 AM 09/23/2021   10:45 AM  Depression screen PHQ 2/9  Decreased Interest 0 0 0  Down, Depressed, Hopeless 0 0 0  PHQ - 2 Score 0 0 0  Altered sleeping 0 0 0  Tired, decreased energy 0 0 0  Change in appetite 0 0 0  Feeling bad or failure about yourself  0 0 0  Trouble concentrating 0 0 0  Moving slowly or fidgety/restless 0 0 0  Suicidal thoughts 0 0 0  PHQ-9 Score 0 0 0  Difficult doing work/chores Not difficult at all Not difficult at all Not difficult at all    BP Readings from Last 3 Encounters:  11/23/22 128/72  09/13/22 126/78  09/23/21 122/86    Physical Exam Vitals and nursing note reviewed.  Constitutional:      General: She is not in acute distress.    Appearance: She is well-developed.  HENT:     Head: Normocephalic and atraumatic.  Cardiovascular:     Rate and Rhythm: Normal rate and regular rhythm.  Pulmonary:     Effort: Pulmonary effort is normal. No respiratory distress.     Breath sounds: No wheezing or rhonchi.  Skin:    General: Skin is warm and dry.     Findings: No rash.          Comments: Small clot on dorsum of right foot seen after dressing saturated with blood removed.  No active bleeding seen currently.  Gause and ACE wrap applied.  Neurological:     Mental Status: She is alert and oriented to person, place, and time.  Psychiatric:        Mood and Affect: Mood normal.        Behavior: Behavior normal.     Wt Readings from Last 3 Encounters:  11/23/22 204 lb (92.5 kg)  09/13/22 196 lb (88.9 kg)  08/03/22 186 lb (84.4 kg)    BP 128/72   Pulse 95   Ht 5' 1"$  (1.549 m)   Wt 204 lb (92.5 kg)   SpO2 99%   BMI 38.55 kg/m   Assessment and Plan: Problem List Items Addressed This Visit       Cardiovascular and Mediastinum   Essential hypertension (Chronic)    Clinically stable exam with well controlled BP on olmesartan. Tolerating medications without side effects. Pt to continue current regimen and low sodium  diet.         Other   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HCC) (Chronic)    Clinically stable with good control of symptoms, No SI or HI. Currently on no medications        Mixed hyperlipidemia (Chronic)    Hyperlipidemia with very high HDL >100 LDL 116      Other Visit Diagnoses     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    -  Primary   leave compression ACE on until tonight Elevate as much as possible today Hold ASA for one week        Partially dictated using Editor, commissioning. Any errors are unintentional.  Halina Maidens, MD [facility]  11/23/2022

## 2022-11-28 DIAGNOSIS — I890 Lymphedema, not elsewhere classified: Secondary | ICD-10-CM | POA: Insufficient documentation

## 2022-11-28 NOTE — Progress Notes (Deleted)
MRN : [id number]  Lori Trevino is a 72 y.o. (May 12, 1951) female who presents with chief complaint of legs hurt and swell.  History of Present Illness:  The patient is seen for evaluation of painful lower extremities. Patient notes the pain is variable and not always associated with activity.  She notes that she wakes up with her legs being very red and extremely warm.  The pain is somewhat consistent day to day occurring on most days. The patient notes the pain also occurs with standing and routinely seems worse as the day wears on. The pain has been progressive over the past several years. The patient states these symptoms are causing  a profound negative impact on quality of life and daily activities.   She has had a venous ablation In the remote past.   The patient denies rest pain or dangling of an extremity off the side of the bed during the night for relief. No open wounds or sores at this time.   The patient is also seen for the evaluation of painful fingers and toes associated with Raynaud's changes. The patient notes the fingers and toes turned pale and then blue and become very painful. Exposure to cold environments makes the symptoms much worse. The changes have been going on for years and seemed to be much worse lately. There is no history of trauma or repetitive injury. The patient does note some peeling of the skin of the fingers in the palms and the skin of the toes on the soles.   The patient has not been taking Norvasc but did try it in the past and it caused severe swelling.   There is no history of malignancy or autoimmune disease.   The patient denies amaurosis fugax or recent TIA symptoms. There are no recent neurological changes noted. The patient denies claudication symptoms or rest pain symptoms. The patient denies history of DVT, PE or superficial thrombophlebitis. The patient denies recent episodes of angina or shortness of breath.   No outpatient  medications have been marked as taking for the 12/01/22 encounter (Appointment) with Delana Meyer, Dolores Lory, MD.    Past Medical History:  Diagnosis Date   Allergy    Hypertension    Hyponatremia, acute on chronic 09/13/2021   Radiculopathy    Tendonitis 11/2019   right wrist and right arm    Past Surgical History:  Procedure Laterality Date   AXILLARY SURGERY Right 2008   CHOLECYSTECTOMY     CYSTOCELE REPAIR N/A 02/08/2016   Procedure: ANTERIOR REPAIR (CYSTOCELE);  Surgeon: Boykin Nearing, MD;  Location: [facility];  Service: Gynecology;  Laterality: N/A;   LAPAROSCOPIC GASTRIC SLEEVE RESECTION WITH HIATAL HERNIA REPAIR  2015   TONSILLECTOMY     TUBAL LIGATION     VAGINAL HYSTERECTOMY N/A 02/08/2016   Procedure: HYSTERECTOMY VAGINAL WITH BSO;  Surgeon: Boykin Nearing, MD;  Location: [facility];  Service: Gynecology;  Laterality: N/A;   VARICOSE VEIN SURGERY      Social History Social History   Tobacco Use   Smoking status: Every Day    Packs/day: 0.10    Years: 60.00    Total pack years: 6.00    Types: Cigarettes   Smokeless tobacco: Never   Tobacco comments:    2-3 daily  Vaping Use   Vaping Use: Never used  Substance Use Topics   Alcohol use: Not Currently    Alcohol/week: 0.0 standard drinks of alcohol  Drug use: No    Family History Family History  Problem Relation Age of Onset   Stroke Mother    Cancer Father    Breast cancer Neg Hx     Allergies  Allergen Reactions   Neurontin [Gabapentin] Swelling   Zolpidem Other (See Comments)    sleepwalking      REVIEW OF SYSTEMS (Negative unless checked)  Constitutional: '[]'$ Weight loss  '[]'$ Fever  '[]'$ Chills Cardiac: '[]'$ Chest pain   '[]'$ Chest pressure   '[]'$ Palpitations   '[]'$ Shortness of breath when laying flat   '[]'$ Shortness of breath with exertion. Vascular:  '[]'$ Pain in legs with walking   '[x]'$ Pain in legs at rest  '[]'$ History of DVT   '[]'$ Phlebitis   '[x]'$ Swelling in legs   '[]'$ Varicose veins   '[]'$ Non-healing  ulcers Pulmonary:   '[]'$ Uses home oxygen   '[]'$ Productive cough   '[]'$ Hemoptysis   '[]'$ Wheeze  '[]'$ COPD   '[]'$ Asthma Neurologic:  '[]'$ Dizziness   '[]'$ Seizures   '[]'$ History of stroke   '[]'$ History of TIA  '[]'$ Aphasia   '[]'$ Vissual changes   '[]'$ Weakness or numbness in arm   '[]'$ Weakness or numbness in leg Musculoskeletal:   '[]'$ Joint swelling   '[]'$ Joint pain   '[]'$ Low back pain Hematologic:  '[]'$ Easy bruising  '[]'$ Easy bleeding   '[]'$ Hypercoagulable state   '[]'$ Anemic Gastrointestinal:  '[]'$ Diarrhea   '[]'$ Vomiting  '[]'$ Gastroesophageal reflux/heartburn   '[]'$ Difficulty swallowing. Genitourinary:  '[]'$ Chronic kidney disease   '[]'$ Difficult urination  '[]'$ Frequent urination   '[]'$ Blood in urine Skin:  '[]'$ Rashes   '[]'$ Ulcers  Psychological:  '[]'$ History of anxiety   '[]'$  History of major depression.  Physical Examination  There were no vitals filed for this visit. There is no height or weight on file to calculate BMI. Gen: WD/WN, NAD Head: [place]/AT, No temporalis wasting.  Ear/Nose/Throat: Hearing grossly intact, nares w/o erythema or drainage, pinna without lesions Eyes: PER, EOMI, sclera nonicteric.  Neck: Supple, no gross masses.  No JVD.  Pulmonary:  Good air movement, no audible wheezing, no use of accessory muscles.  Cardiac: RRR, precordium not hyperdynamic. Vascular:  scattered varicosities present bilaterally.  Moderate venous stasis changes to the legs bilaterally.  2+ soft pitting edema. CEAP [id number]   Vessel Right Left  Radial Palpable Palpable  Gastrointestinal: soft, non-distended. No guarding/no peritoneal signs.  Musculoskeletal: M/S 5/5 throughout.  No deformity.  Neurologic: CN 2-12 intact. Pain and light touch intact in extremities.  Symmetrical.  Speech is fluent. Motor exam as listed above. Psychiatric: Judgment intact, Mood & affect appropriate for pt's clinical situation. Dermatologic: Venous rashes no ulcers noted.  No changes consistent with cellulitis. Lymph : No lichenification or skin changes of chronic lymphedema.  CBC Lab  Results  Component Value Date   WBC 5.0 09/13/2022   HGB 10.7 (L) 09/13/2022   HCT 32.6 (L) 09/13/2022   MCV 91 09/13/2022   PLT 287 09/13/2022    BMET    Component Value Date/Time   NA 134 09/13/2022 1511   K 4.5 09/13/2022 1511   CL 97 09/13/2022 1511   CO2 23 09/13/2022 1511   GLUCOSE 78 09/13/2022 1511   GLUCOSE 87 09/15/2021 0635   BUN 11 09/13/2022 1511   CREATININE 0.63 09/13/2022 1511   CALCIUM 9.3 09/13/2022 1511   GFRNONAA >60 09/15/2021 0635   GFRAA >60 12/18/2019 0829   CrCl cannot be calculated (Patient's most recent lab result is older than the maximum 21 days allowed.).  COAG No results found for: "INR", "PROTIME"  Radiology No results found.   Assessment/Plan There are no diagnoses  linked to this encounter.   Hortencia Pilar, MD  11/28/2022 12:46 PM

## 2022-12-01 ENCOUNTER — Ambulatory Visit (INDEPENDENT_AMBULATORY_CARE_PROVIDER_SITE_OTHER): Payer: Medicare Other | Admitting: Vascular Surgery

## 2022-12-01 ENCOUNTER — Encounter (INDEPENDENT_AMBULATORY_CARE_PROVIDER_SITE_OTHER): Payer: Medicare Other

## 2022-12-01 DIAGNOSIS — I890 Lymphedema, not elsewhere classified: Secondary | ICD-10-CM

## 2022-12-01 DIAGNOSIS — I872 Venous insufficiency (chronic) (peripheral): Secondary | ICD-10-CM

## 2022-12-01 DIAGNOSIS — I10 Essential (primary) hypertension: Secondary | ICD-10-CM

## 2022-12-01 DIAGNOSIS — E782 Mixed hyperlipidemia: Secondary | ICD-10-CM

## 2022-12-01 DIAGNOSIS — I7300 Raynaud's syndrome without gangrene: Secondary | ICD-10-CM

## 2023-01-05 ENCOUNTER — Ambulatory Visit: Payer: Medicare Other

## 2023-01-05 ENCOUNTER — Other Ambulatory Visit: Payer: Medicare Other

## 2023-02-25 ENCOUNTER — Other Ambulatory Visit: Payer: Self-pay | Admitting: Internal Medicine

## 2023-02-25 DIAGNOSIS — I10 Essential (primary) hypertension: Secondary | ICD-10-CM

## 2023-02-28 NOTE — Telephone Encounter (Signed)
Requested Prescriptions  Pending Prescriptions Disp Refills   olmesartan (BENICAR) 20 MG tablet [Pharmacy Med Name: OLMESARTAN MEDOXOMIL 20 MG TAB] 90 tablet 0    Sig: TAKE 1 TABLET BY MOUTH EVERY DAY     Cardiovascular:  Angiotensin Receptor Blockers Passed - 02/25/2023  4:26 PM      Passed - Cr in normal range and within 180 days    Creatinine, Ser  Date Value Ref Range Status  09/13/2022 0.63 0.57 - 1.00 mg/dL Final         Passed - K in normal range and within 180 days    Potassium  Date Value Ref Range Status  09/13/2022 4.5 3.5 - 5.2 mmol/L Final         Passed - Patient is not pregnant      Passed - Last BP in normal range    BP Readings from Last 1 Encounters:  11/23/22 128/72         Passed - Valid encounter within last 6 months    Recent Outpatient Visits           3 months ago 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 [facility] at [facility], Nyoka Cowden, MD   2 years ago Essential hypertension   [facility] at [facility], Nyoka Cowden, MD

## 2023-03-27 ENCOUNTER — Other Ambulatory Visit: Payer: Self-pay

## 2023-03-27 ENCOUNTER — Emergency Department: Payer: Medicare Other

## 2023-03-27 ENCOUNTER — Emergency Department
Admission: EM | Admit: 2023-03-27 | Discharge: 2023-03-27 | Disposition: A | Discharge status: Home / Self Care | Payer: Medicare Other | Attending: Emergency Medicine | Admitting: Emergency Medicine

## 2023-03-27 DIAGNOSIS — Z8616 Personal history of COVID-19: Secondary | ICD-10-CM | POA: Insufficient documentation

## 2023-03-27 DIAGNOSIS — R197 Diarrhea, unspecified: Secondary | ICD-10-CM | POA: Diagnosis not present

## 2023-03-27 DIAGNOSIS — K838 Other specified diseases of biliary tract: Secondary | ICD-10-CM | POA: Diagnosis not present

## 2023-03-27 DIAGNOSIS — R110 Nausea: Secondary | ICD-10-CM | POA: Diagnosis not present

## 2023-03-27 DIAGNOSIS — K639 Disease of intestine, unspecified: Secondary | ICD-10-CM | POA: Diagnosis not present

## 2023-03-27 DIAGNOSIS — I700 Atherosclerosis of aorta: Secondary | ICD-10-CM | POA: Diagnosis not present

## 2023-03-27 DIAGNOSIS — R42 Dizziness and giddiness: Secondary | ICD-10-CM | POA: Insufficient documentation

## 2023-03-27 DIAGNOSIS — R531 Weakness: Secondary | ICD-10-CM | POA: Diagnosis not present

## 2023-03-27 DIAGNOSIS — Z1152 Encounter for screening for COVID-19: Secondary | ICD-10-CM | POA: Insufficient documentation

## 2023-03-27 DIAGNOSIS — R6883 Chills (without fever): Secondary | ICD-10-CM

## 2023-03-27 DIAGNOSIS — R55 Syncope and collapse: Secondary | ICD-10-CM

## 2023-03-27 LAB — CBC
HCT: 33.8 % — ABNORMAL LOW (ref 36.0–46.0)
Hemoglobin: 10.9 g/dL — ABNORMAL LOW (ref 12.0–15.0)
MCH: 29.4 pg (ref 26.0–34.0)
MCHC: 32.2 g/dL (ref 30.0–36.0)
MCV: 91.1 fL (ref 80.0–100.0)
Platelets: 269 10*3/uL (ref 150–400)
RBC: 3.71 MIL/uL — ABNORMAL LOW (ref 3.87–5.11)
RDW: 13.5 % (ref 11.5–15.5)
WBC: 5.5 10*3/uL (ref 4.0–10.5)
nRBC: 0 % (ref 0.0–0.2)

## 2023-03-27 LAB — BASIC METABOLIC PANEL
Anion gap: 9 (ref 5–15)
BUN: 11 mg/dL (ref 8–23)
CO2: 25 mmol/L (ref 22–32)
Calcium: 9.4 mg/dL (ref 8.9–10.3)
Chloride: 98 mmol/L (ref 98–111)
Creatinine, Ser: 0.68 mg/dL (ref 0.44–1.00)
GFR, Estimated: >60 mL/min (ref >60)
Glucose, Bld: 90 mg/dL (ref 70–99)
Potassium: 4.1 mmol/L (ref 3.5–5.1)
Sodium: 132 mmol/L — ABNORMAL LOW (ref 135–145)

## 2023-03-27 LAB — CBG MONITORING, ED: Glucose-Capillary: 84 mg/dL (ref 70–99)

## 2023-03-27 LAB — RESP PANEL BY RT-PCR (RSV, FLU A&B, COVID)  RVPGX2
Influenza A by PCR: NEGATIVE
Influenza B by PCR: NEGATIVE
Resp Syncytial Virus by PCR: NEGATIVE
SARS Coronavirus 2 by RT PCR: NEGATIVE

## 2023-03-27 LAB — TROPONIN I (HIGH SENSITIVITY): Troponin I (High Sensitivity): 3 ng/L (ref <18)

## 2023-03-27 MED ORDER — IOHEXOL 300 MG/ML  SOLN
100.0000 mL | Freq: Once | INTRAMUSCULAR | Status: AC | PRN
  Administered 2023-03-27: 100 mL via INTRAVENOUS

## 2023-03-27 MED ORDER — SODIUM CHLORIDE 0.9 % IV BOLUS
1000.0000 mL | Freq: Once | INTRAVENOUS | Status: AC
  Administered 2023-03-27: 1000 mL via INTRAVENOUS

## 2023-03-27 MED ORDER — ONDANSETRON 8 MG PO TBDP: 8.0000 mg | ORAL_TABLET | Freq: Three times a day (TID) | ORAL | 0 refills | Status: DC | PRN

## 2023-03-27 MED ORDER — MORPHINE SULFATE (PF) 2 MG/ML IV SOLN
2.0000 mg | Freq: Once | INTRAVENOUS | Status: AC
  Administered 2023-03-27: 2 mg via INTRAVENOUS
  Filled 2023-03-27: qty 1

## 2023-03-27 NOTE — ED Notes (Signed)
Bair hugger removed. Temp is 98.1

## 2023-03-27 NOTE — ED Notes (Signed)
Bair hugger applied.

## 2023-03-27 NOTE — ED Provider Notes (Signed)
[facility] Provider Note    Event Date/Time   First MD Initiated Contact with Patient 03/27/23 1241     (approximate)   History   Near Syncope and Chills   HPI  Lori Trevino is a 72 y.o. female  who presents to the emergency department today because of concern for nausea, dizziness and chills. The patient was in her normal state of health yesterday.  She went to use the bathroom today when she started feeling very dizzy.  She became weak.  She felt it was hard for her to get up.  She did have some nausea and loose stools. The patient says that this reminds her of how she felt when she was diagnosed with COVID.      Physical Exam   Triage Vital Signs: ED Triage Vitals  Enc Vitals Group     BP 03/27/23 1231 (!) 123/95     Pulse Rate 03/27/23 1231 80     Resp 03/27/23 1231 19     Temp 03/27/23 1231 (!) 96.5 F (35.8 C)     Temp Source 03/27/23 1231 Rectal     SpO2 03/27/23 1222 100 %     Weight 03/27/23 1232 203 lb 7.8 oz (92.3 kg)     Height 03/27/23 1232 5\' 1"  (1.549 m)     Head Circumference --      Peak Flow --      Pain Score 03/27/23 1232 0     Pain Loc --      Pain Edu? --      Excl. in GC? --     Most recent vital signs: Vitals:   03/27/23 1222 03/27/23 1231  BP:  (!) 123/95  Pulse:  80  Resp:  19  Temp:  (!) 96.5 F (35.8 C)  SpO2: 100% 97%   General: Awake, alert, oriented. CV:  Good peripheral perfusion. Regular rate and rhythm. Resp:  Normal effort. Lungs clear. Abd:  No distention.    ED Results / Procedures / Treatments   Labs (all labs ordered are listed, but only abnormal results are displayed) Labs Reviewed  BASIC METABOLIC PANEL - Abnormal; Notable for the following components:      Result Value   Sodium 132 (*)    All other components within normal limits  CBC - Abnormal; Notable for the following components:   RBC 3.71 (*)    Hemoglobin 10.9 (*)    HCT 33.8 (*)    All other components within normal  limits  RESP PANEL BY RT-PCR (RSV, FLU A&B, COVID)  RVPGX2  URINALYSIS, ROUTINE W REFLEX MICROSCOPIC  CBG MONITORING, ED     EKG  I, Phineas Semen, attending physician, personally viewed and interpreted this EKG  EKG Time: 1233 Rate: 83 Rhythm: sinus rhythm Axis: normal Intervals: qtc 487 QRS: IVCD ST changes: no st elevation Impression: abnormal ekg    RADIOLOGY CT pending  PROCEDURES:  Critical Care performed: No    MEDICATIONS ORDERED IN ED: Medications - No data to display   IMPRESSION / MDM / ASSESSMENT AND PLAN / ED COURSE  I reviewed the triage vital signs and the nursing notes.                              Differential diagnosis includes, but is not limited to, diverticulitis, gastroenteritis, dehydration, anemia.  Patient's presentation is most consistent with acute presentation with potential threat to life  or bodily function.   The patient is on the cardiac monitor to evaluate for evidence of arrhythmia and/or significant heart rate changes.  Patient presented to the emergency department today because of concerns for dizziness, nausea and chills.  Symptoms started today.  On exam without any significant abdominal pain.  Patient states this reminds her from when she had COVID.  COVID however is negative.  Will obtain a CT of the abdomen pelvis to evaluate for any intra-abdominal infection causing the patient's symptoms.      FINAL CLINICAL IMPRESSION(S) / ED DIAGNOSES   Dizziness Nausea    Note:  This document was prepared using Dragon voice recognition software and may include unintentional dictation errors.    Phineas Semen, MD 03/27/23 [id number]

## 2023-03-27 NOTE — ED Triage Notes (Signed)
Pt arrives via EMS. A family member found her laying in the bathroom floor about 30-45 mins ago. Pt reported to family that she felt weak prior to going to the restroom, patient had a near syncopal episode while using the restroom. Pt arrives AxOx4. Pt reports she still feels dizzy, c/o generalized weakness and has been experiencing cold chills this morning. Pt states she feels like she might have the flu. She reports she is unsure if she took her methadone this morning.

## 2023-03-28 ENCOUNTER — Ambulatory Visit: Payer: Medicare Other | Admitting: Internal Medicine

## 2023-03-28 ENCOUNTER — Telehealth: Payer: Self-pay

## 2023-03-28 DIAGNOSIS — K639 Disease of intestine, unspecified: Secondary | ICD-10-CM

## 2023-03-28 DIAGNOSIS — R9389 Abnormal findings on diagnostic imaging of other specified body structures: Secondary | ICD-10-CM

## 2023-03-28 NOTE — Transitions of Care (Post Inpatient/ED Visit) (Signed)
03/28/2023  Name: Lori Trevino MRN: [id number] DOB: 1951-07-09  Today's TOC FU Call Status: Today's TOC FU Call Status:: Successful TOC FU Call Competed TOC FU Call Complete Date: 03/28/23  Transition Care Management Follow-up Telephone Call Date of Discharge: 03/27/23 Discharge Facility: [facility]) Type of Discharge: Emergency Department Reason for ED Visit: Other: (Near Syncope) How have you been since you were released from the hospital?: Same Any questions or concerns?: No  Items Reviewed: Did you receive and understand the discharge instructions provided?: Yes Medications obtained,verified, and reconciled?: Yes (Medications Reviewed) Any new allergies since your discharge?: No Dietary orders reviewed?: No Do you have support at home?: Yes  Medications Reviewed Today: Medications Reviewed Today     Reviewed by Person, Lori Trevino, CMA (Certified Medical Assistant) on 11/23/22 at 1124  Med List Status: <None>   Medication Order Taking? Sig Documenting Provider Last Dose Status Informant  albuterol (VENTOLIN HFA) 108 (90 Base) MCG/ACT inhaler [id number]  TAKE 2 PUFFS BY MOUTH EVERY 6 HOURS AS NEEDED FOR WHEEZE OR SHORTNESS OF BREATH Reubin Milan, MD  Active     Discontinued 12/18/19 0855   B Complex-Biotin-FA (B-COMPLEX PO) [id number]  Take 1 tablet by mouth daily. [provider]  Active Pharmacy Records  cyclobenzaprine (FLEXERIL) 10 MG tablet [id number]  TAKE 1/2-1 TABLET BY MOUTH 3 TIMES A DAY AS NEEDED [provider]  Active Pharmacy Records  fluticasone (FLONASE) 50 MCG/ACT nasal spray [id number]  PLACE 1 SPRAY INTO BOTH NOSTRILS DAILY. Reubin Milan, MD  Active Pharmacy Records  hydrOXYzine (VISTARIL) 25 MG capsule [id number]  TAKE 3 CAPSULES (75 MG TOTAL) BY MOUTH AT BEDTIME AS NEEDED. Reubin Milan, MD  Active   loratadine (CLARITIN) 10 MG tablet [id number]  Take 10 mg by mouth daily as needed for allergies. [provider]  Active Pharmacy Records  methadone (DOLOPHINE) 1 mg/ml oral solution [id number]  Take by mouth daily. 100 mg [provider]  Active Pharmacy Records  Multiple Vitamin (ONE-A-DAY 55 PLUS PO) [id number]  Take 1 tablet by mouth daily. [provider]  Active Pharmacy Records  olmesartan (BENICAR) 20 MG tablet [id number]  Take 1 tablet (20 mg total) by mouth daily. Reubin Milan, MD  Active   ondansetron (ZOFRAN-ODT) 8 MG disintegrating tablet [id number]  TAKE 1 TABLET (8 MG TOTAL) BY MOUTH EVERY 8 (EIGHT) HOURS AS NEEDED FOR NAUSEA OR VOMITING. Reubin Milan, MD  Active Pharmacy Records           Med Note Kirke Corin Sep 13, 2021  5:17 AM) Last Fill: 02/18/2021 8 MG TBDP (disp 20, 6d supply)   pantoprazole (PROTONIX) 40 MG tablet [id number]  Take 1 tablet (40 mg total) by mouth daily. Arnetha Courser, MD  Active             Home Care and Equipment/Supplies: Were Home Health Services Ordered?: No Any new equipment or medical supplies ordered?: No  Functional Questionnaire: Do you need assistance with bathing/showering or dressing?: No Do you need assistance with meal preparation?: No Do you need assistance with eating?: No Do you have difficulty maintaining continence: No Do you need assistance with getting out of bed/getting out of a chair/moving?: No Do you have difficulty managing or taking your medications?: No  Follow up appointments reviewed: PCP Follow-up appointment confirmed?: Yes Date of PCP follow-up appointment?: 03/28/23 Follow-up Provider: Dr Lori Graves Do you need transportation to your follow-up appointment?: No Do  you understand care options if your condition(s) worsen?: Yes-patient verbalized understanding    Lori Trevino  Primary Care & Sports Medicine at [facility] CMA, AAMA [address]  [place] [address] Office [phone]  Fax: [phone]

## 2023-03-29 ENCOUNTER — Telehealth: Payer: Self-pay | Admitting: Internal Medicine

## 2023-03-29 NOTE — Telephone Encounter (Signed)
Spoke with referral coordinator. Patient is scheduled for 07/3. She will call patient and informed.  - Haedyn Ancrum

## 2023-03-29 NOTE — Telephone Encounter (Signed)
Copied from CRM [id number]. Topic: Referral - Question >> Mar 29, 2023  9:22 AM Everette C wrote: Reason for CRM: The patient has called to follow up on previous discussions with C. McAdoo related to a potential GI referral   Please contact further when possible

## 2023-03-30 DIAGNOSIS — M1990 Unspecified osteoarthritis, unspecified site: Secondary | ICD-10-CM | POA: Insufficient documentation

## 2023-03-30 DIAGNOSIS — R0683 Snoring: Secondary | ICD-10-CM | POA: Insufficient documentation

## 2023-04-04 NOTE — Progress Notes (Unsigned)
Celso Amy, PA-C [address]  [address]  [place], [place] [address]  Main: [phone]  Fax: [phone]   Gastroenterology Consultation  Referring Provider:     Reubin Milan, MD Primary Care Physician:  Reubin Milan, MD Primary Gastroenterologist:  Celso Amy, PA-C / Dr. [name]  Reason for Consultation:     Abnormal CT scan, thickening of the colon        HPI:   Lori Trevino is a 72 y.o. y/o female referred for consultation & management  by Reubin Milan, MD.    Patient went to [facility] ED 03/27/2023 to evaluate nausea, dizziness, and chills.  She has had nausea and loose stools.  Labs showed slightly low sodium 132, normal GFR, BUN 11, creatinine 0.68, negative troponin, normocytic anemia with hemoglobin 10.9, hematocrit 33, MCV 91.  Normal white count 5.5.  Respiratory panel negative for influenza, RSV, and COVID.  No LFTs were checked.  Abdominal pelvic CT 03/27/2023 in the ED showed prior sleeve gastrectomy, short segment circumferential wall thickening of the mid ascending colon, moderate colon stool burden (constipation), mild left-sided diverticulosis.  Previous cholecystectomy with intrahepatic and extrahepatic biliary dilatation, dilated common bile duct up to 19 mm.  Atrophic pancreas with no inflammation.  Previous hysterectomy.  Symptoms: Patient is having chronic constipation and chronic nausea.  She denies abdominal pain, diarrhea, hematochezia, or melena.  Has occasional heartburn but no dysphagia.  No family history of colon cancer.  Denies unintentional weight loss.  She has had some weight gain.  No previous GI evaluation.  Never had a colonoscopy or EGD.  Current smoker.  No current alcohol or NSAID use.  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  Moved to [place] from [place] 10 years ago.  Lives [place]. No aspirin or blood thinners.   Past Medical History:  Diagnosis Date    Allergy    Hypertension    Hyponatremia, acute on chronic 09/13/2021   Radiculopathy    Tendonitis 11/2019   right wrist and right arm    Past Surgical History:  Procedure Laterality Date   AXILLARY SURGERY Right 2008   CHOLECYSTECTOMY     CYSTOCELE REPAIR N/A 02/08/2016   Procedure: ANTERIOR REPAIR (CYSTOCELE);  Surgeon: Suzy Bouchard, MD;  Location: [facility] ORS;  Service: Gynecology;  Laterality: N/A;   LAPAROSCOPIC GASTRIC SLEEVE RESECTION WITH HIATAL HERNIA REPAIR  2015   TONSILLECTOMY     TUBAL LIGATION     VAGINAL HYSTERECTOMY N/A 02/08/2016   Procedure: HYSTERECTOMY VAGINAL WITH BSO;  Surgeon: Suzy Bouchard, MD;  Location: [facility] ORS;  Service: Gynecology;  Laterality: N/A;   VARICOSE VEIN SURGERY      Prior to Admission medications   Medication Sig Start Date End Date Taking? Authorizing Provider  albuterol (VENTOLIN HFA) 108 (90 Base) MCG/ACT inhaler TAKE 2 PUFFS BY MOUTH EVERY 6 HOURS AS NEEDED FOR WHEEZE OR SHORTNESS OF BREATH 10/19/21   Reubin Milan, MD  B Complex-Biotin-FA (B-COMPLEX PO) Take 1 tablet by mouth daily.    [provider]  cyclobenzaprine (FLEXERIL) 10 MG tablet TAKE 1/2-1 TABLET BY MOUTH 3 TIMES A DAY AS NEEDED 12/10/20   [provider]  fluticasone (FLONASE) 50 MCG/ACT nasal spray PLACE 1 SPRAY INTO BOTH NOSTRILS DAILY. 10/30/20   Reubin Milan, MD  loratadine (CLARITIN) 10 MG tablet Take 10 mg by mouth daily as needed  for allergies.    [provider]  methadone (DOLOPHINE) 1 mg/ml oral solution Take by mouth daily. 100 mg    [provider]  Multiple Vitamin (ONE-A-DAY 55 PLUS PO) Take 1 tablet by mouth daily.    [provider]  olmesartan (BENICAR) 20 MG tablet TAKE 1 TABLET BY MOUTH EVERY DAY 02/28/23   Reubin Milan, MD  ondansetron (ZOFRAN-ODT) 8 MG disintegrating tablet Take 1 tablet (8 mg total) by mouth every 8 (eight) hours as needed for nausea or vomiting. 03/27/23   Merwyn Katos, MD  amLODipine (NORVASC) 5 MG tablet TAKE 1 TABLET (5 MG TOTAL) BY MOUTH DAILY. FOR HIGH BLOOD PRESSURE 09/09/19 12/18/19  Reubin Milan, MD    Family History  Problem Relation Age of Onset   Stroke Mother    Cancer Father    Breast cancer Neg Hx      Social History   Tobacco Use   Smoking status: Every Day    Packs/day: 0.10    Years: 60.00    Additional pack years: 0.00    Total pack years: 6.00    Types: Cigarettes   Smokeless tobacco: Never   Tobacco comments:    2-3 daily  Vaping Use   Vaping Use: Never used  Substance Use Topics   Alcohol use: Not Currently    Alcohol/week: 0.0 standard drinks of alcohol   Drug use: No    Allergies as of 04/05/2023 - Review Complete 04/05/2023  Allergen Reaction Noted   Neurontin [gabapentin] Swelling 06/30/2017   Zolpidem Other (See Comments) 06/12/2014    Review of Systems:    All systems reviewed and negative except where noted in HPI.   Physical Exam:  BP 135/88   Pulse 96   Temp 97.9 F (36.6 C)   Ht 5\' 2"  (1.575 m)   Wt 198 lb 9.6 oz (90.1 kg)   BMI 36.32 kg/m  No LMP recorded. Patient is postmenopausal. Psych:  Alert and cooperative. Anxious mood and affect. General:   Alert,  Well-developed, obese, well-nourished, pleasant and cooperative in NAD; walks with a cane. Head:  Normocephalic and atraumatic. Eyes:  Sclera clear, no icterus.   Conjunctiva pink. Neck:  Supple; no masses or thyromegaly. Lungs:  Respirations even and unlabored.  Clear throughout to auscultation.   No wheezes, crackles, or rhonchi. No acute distress. Heart:  Regular rate and rhythm; no murmurs, clicks, rubs, or gallops. Abdomen:  Normal bowel sounds.  No bruits.  Soft, and non-distended without masses, hepatosplenomegaly or hernias noted.  No Tenderness.  No guarding or rebound tenderness.    Neurologic:  Alert and oriented x3;  grossly normal neurologically. Psych:  Alert and cooperative.  Anxious mood and affect.  Imaging  Studies: CT ABDOMEN PELVIS W CONTRAST  Result Date: 03/27/2023 CLINICAL DATA:  Weakness and near-syncope. EXAM: CT ABDOMEN AND PELVIS WITH CONTRAST TECHNIQUE: Multidetector CT imaging of the abdomen and pelvis was performed using the standard protocol following bolus administration of intravenous contrast. RADIATION DOSE REDUCTION: This exam was performed according to the departmental dose-optimization program which includes automated exposure control, adjustment of the mA and/or kV according to patient size and/or use of iterative reconstruction technique. CONTRAST:  OMNIPAQUE IOHEXOL 300 MG/ML  SOLN COMPARISON:  None Available. FINDINGS: Lower chest: No acute abnormality. Hepatobiliary: No focal liver abnormality. Status post cholecystectomy. Mild left-greater-than-right intrahepatic biliary dilatation. Dilated common bile duct up to 19 mm. Pancreas: Atrophic. No ductal dilatation or surrounding inflammatory changes. Spleen:  Normal in size without focal abnormality. Adrenals/Urinary Tract: Adrenal glands are unremarkable. Kidneys are normal, without renal calculi, focal lesion, or hydronephrosis. Bladder is unremarkable. Stomach/Bowel: Postsurgical changes from prior sleeve gastrectomy. Focal short segment circumferential wall thickening of the mid ascending colon (series 2, image 34; series 5, images 37-39). Moderate colonic stool burden. Mild left-sided colonic diverticulosis. Normal appendix. Vascular/Lymphatic: Aortic atherosclerosis. No enlarged abdominal or pelvic lymph nodes. Reproductive: Status post hysterectomy. No adnexal masses. Other: No free fluid or pneumoperitoneum. Musculoskeletal: No acute or significant osseous findings. IMPRESSION: 1. No acute intra-abdominal process. 2. Focal short segment circumferential wall thickening of the mid ascending colon. Recommend colonoscopy to exclude underlying neoplasm. 3. Intrahepatic and extrahepatic biliary dilatation, greater than expected for  postcholecystectomy state. Correlate with LFTs and consider further evaluation with MRCP. 4.  Aortic Atherosclerosis (ICD10-I70.0). Electronically Signed   By: Obie Dredge M.D.   On: 03/27/2023 16:05    Assessment and Plan:   Pema Campion is a 72 y.o. y/o female has been referred for abnormal CT scan showing multiple abnormal findings.  CBC showed hemoglobin 10.9.  She has chronic anemia.  1.) Prior sleeve gastrectomy   2.) Short segment circumferential wall thickening of the mid ascending colon.  Scheduling Colonoscopy I discussed risks of colonoscopy with patient to include risk of bleeding, colon perforation, and risk of sedation.  Patient expressed understanding and agrees to proceed with colonoscopy.   3.) Moderate colon stool burden (Constipation), mild left-sided diverticulosis.    Start MiraLAX, 1 capful in a drink daily.  Continue Colace stool softener 100 mg once daily.  Drink 64 ounces of water fluids daily.  High-fiber diet with fruits, vegetables, whole grains.  4.) Previous cholecystectomy with intrahepatic and extrahepatic biliary dilatation, dilated common bile duct up to 19 mm.    Schedule abdominal MRI/MRCP  Lab: CMP  5.) Atrophic pancreas with no inflammation.    Lab: Lipase Schedule abdominal MRI/MRCP  6.)  Chronic Anemia  Labs: CBC, iron panel, ferritin, B12, folate  Scheduling EGD I discussed risks of EGD with patient to include risk of bleeding, perforation, and risk of sedation.  Patient expressed understanding and agrees to proceed with EGD.     Follow up in 4 weeks after EGD/colonoscopy with TG.  Celso Amy, PA-C

## 2023-04-05 ENCOUNTER — Ambulatory Visit (INDEPENDENT_AMBULATORY_CARE_PROVIDER_SITE_OTHER): Payer: Medicare Other | Admitting: Physician Assistant

## 2023-04-05 ENCOUNTER — Encounter: Payer: Self-pay | Admitting: Physician Assistant

## 2023-04-05 VITALS — BP 135/88 | HR 96 | Temp 97.9°F | Ht 62.0 in | Wt 198.6 lb

## 2023-04-05 DIAGNOSIS — K838 Other specified diseases of biliary tract: Secondary | ICD-10-CM | POA: Diagnosis not present

## 2023-04-05 DIAGNOSIS — R933 Abnormal findings on diagnostic imaging of other parts of digestive tract: Secondary | ICD-10-CM

## 2023-04-05 DIAGNOSIS — D649 Anemia, unspecified: Secondary | ICD-10-CM

## 2023-04-05 DIAGNOSIS — K8689 Other specified diseases of pancreas: Secondary | ICD-10-CM

## 2023-04-05 DIAGNOSIS — K5904 Chronic idiopathic constipation: Secondary | ICD-10-CM

## 2023-04-05 MED ORDER — PEG 3350-KCL-NA BICARB-NACL 420 G PO SOLR: 4000.0000 mL | Freq: Once | ORAL | 0 refills | Status: AC

## 2023-04-05 NOTE — Patient Instructions (Addendum)
MRI scheduled MRCP 04/13/23 @ 3:oo pm. [facility] entrance. Nothing to eat /drink 4 hours prior.

## 2023-04-06 LAB — IRON,TIBC AND FERRITIN PANEL
Ferritin: 18 ng/mL (ref 15–150)
Iron Saturation: 10 % — ABNORMAL LOW (ref 15–55)
Iron: 36 ug/dL (ref 27–139)
Total Iron Binding Capacity: 350 ug/dL (ref 250–450)
UIBC: 314 ug/dL (ref 118–369)

## 2023-04-06 LAB — CBC WITH DIFFERENTIAL
Basophils Absolute: 0 10*3/uL (ref 0.0–0.2)
Basos: 0 %
EOS (ABSOLUTE): 0.1 10*3/uL (ref 0.0–0.4)
Eos: 2 %
Hematocrit: 32.2 % — ABNORMAL LOW (ref 34.0–46.6)
Hemoglobin: 10.5 g/dL — ABNORMAL LOW (ref 11.1–15.9)
Immature Grans (Abs): 0 10*3/uL (ref 0.0–0.1)
Immature Granulocytes: 0 %
Lymphocytes Absolute: 0.9 10*3/uL (ref 0.7–3.1)
Lymphs: 20 %
MCH: 30 pg (ref 26.6–33.0)
MCHC: 32.6 g/dL (ref 31.5–35.7)
MCV: 92 fL (ref 79–97)
Monocytes Absolute: 0.5 10*3/uL (ref 0.1–0.9)
Monocytes: 11 %
Neutrophils Absolute: 3 10*3/uL (ref 1.4–7.0)
Neutrophils: 67 %
RBC: 3.5 x10E6/uL — ABNORMAL LOW (ref 3.77–5.28)
RDW: 13.3 % (ref 11.7–15.4)
WBC: 4.6 10*3/uL (ref 3.4–10.8)

## 2023-04-06 LAB — COMPREHENSIVE METABOLIC PANEL
ALT: 13 IU/L (ref 0–32)
AST: 21 IU/L (ref 0–40)
Albumin: 4.4 g/dL (ref 3.8–4.8)
Alkaline Phosphatase: 97 IU/L (ref 44–121)
BUN/Creatinine Ratio: 13 (ref 12–28)
BUN: 8 mg/dL (ref 8–27)
Bilirubin Total: 0.3 mg/dL (ref 0.0–1.2)
CO2: 26 mmol/L (ref 20–29)
Calcium: 9.4 mg/dL (ref 8.7–10.3)
Chloride: 97 mmol/L (ref 96–106)
Creatinine, Ser: 0.64 mg/dL (ref 0.57–1.00)
Globulin, Total: 2 g/dL (ref 1.5–4.5)
Glucose: 79 mg/dL (ref 70–99)
Potassium: 5 mmol/L (ref 3.5–5.2)
Sodium: 134 mmol/L (ref 134–144)
Total Protein: 6.4 g/dL (ref 6.0–8.5)
eGFR: 93 mL/min/{1.73_m2} (ref >59)

## 2023-04-06 LAB — B12 AND FOLATE PANEL
Folate: 9.2 ng/mL (ref >3.0)
Vitamin B-12: 341 pg/mL (ref 232–1245)

## 2023-04-06 LAB — LIPASE: Lipase: 10 U/L — ABNORMAL LOW (ref 14–85)

## 2023-04-07 ENCOUNTER — Telehealth: Payer: Self-pay | Admitting: Physician Assistant

## 2023-04-07 NOTE — Telephone Encounter (Signed)
Patient called stating that she needs a open MRI. She is schedule at [facility].

## 2023-04-10 ENCOUNTER — Ambulatory Visit: Payer: Medicare Other | Admitting: Internal Medicine

## 2023-04-10 ENCOUNTER — Telehealth: Payer: Self-pay

## 2023-04-10 NOTE — Telephone Encounter (Signed)
Patient requesting open MRI-spoke with Whitney at [facility] will call patient to schedule the appointment.

## 2023-04-13 ENCOUNTER — Ambulatory Visit: Payer: Medicare Other

## 2023-04-19 ENCOUNTER — Other Ambulatory Visit: Payer: Medicare Other

## 2023-04-20 ENCOUNTER — Ambulatory Visit
Admission: RE | Admit: 2023-04-20 | Discharge: 2023-04-20 | Disposition: A | Discharge status: Home / Self Care | Payer: Medicare Other | Source: Ambulatory Visit | Attending: Physician Assistant | Admitting: Physician Assistant

## 2023-04-20 ENCOUNTER — Telehealth: Payer: Self-pay | Admitting: Physician Assistant

## 2023-04-20 ENCOUNTER — Telehealth: Payer: Self-pay

## 2023-04-20 DIAGNOSIS — K838 Other specified diseases of biliary tract: Secondary | ICD-10-CM

## 2023-04-20 NOTE — Telephone Encounter (Signed)
Open MRI scheduled 05/27/23 @ 4:20 pm.[address] [place]

## 2023-04-20 NOTE — Telephone Encounter (Signed)
Patient called in because she was not able to do her MRI. She stated that she could not get her MRI done because she flipped out. She stated that she needs an open MRI.

## 2023-04-21 ENCOUNTER — Encounter: Payer: Self-pay | Admitting: Internal Medicine

## 2023-05-04 ENCOUNTER — Encounter: Payer: Self-pay | Admitting: Gastroenterology

## 2023-05-04 ENCOUNTER — Other Ambulatory Visit: Payer: Medicare Other

## 2023-05-09 ENCOUNTER — Telehealth: Payer: Self-pay

## 2023-05-09 MED ORDER — PEG 3350-KCL-NA BICARB-NACL 420 G PO SOLR: 4000.0000 mL | Freq: Once | ORAL | 0 refills | Status: AC

## 2023-05-09 NOTE — Telephone Encounter (Signed)
PT called and left message to cancel procedure on 05/11/2023 please return call to reschedule

## 2023-05-09 NOTE — Telephone Encounter (Signed)
Received call from patient -needs to reschedule colonoscopy and Endoscopy from 05/11/23 Dr.Wohl [place] to 06/30/23 Dr.Wohl [place]. Golytely sent to pharmacy. New instructions printed and mailed to patient.

## 2023-05-10 NOTE — Telephone Encounter (Signed)
Spoke with patient to let her know that her Colonoscopy/EGD has been switched to 9/27-24 Dr.Wohl in [place] per her request.

## 2023-05-19 ENCOUNTER — Telehealth: Payer: Self-pay

## 2023-05-19 NOTE — Telephone Encounter (Signed)
Spoke with patient to let her know I called scheduling and was told that there is no Open Mri in [place]. She is currently scheduled in [place] [facility]- she stated she seen it on the internet.    Patient is calling because she has a MRI schedule for 05/27/2023 in [place] she states. She states that someone told her they had a open MRI in [place] and she would like it done there. Please call patient back

## 2023-05-19 NOTE — Telephone Encounter (Signed)
Patient is calling because she has a MRI schedule for 05/27/2023 in [place] she states. She states that someone told her they had a open MRI in [place] and she would like it done there. Please call patient back

## 2023-05-25 ENCOUNTER — Other Ambulatory Visit: Payer: Self-pay | Admitting: Internal Medicine

## 2023-05-25 DIAGNOSIS — I10 Essential (primary) hypertension: Secondary | ICD-10-CM

## 2023-05-27 ENCOUNTER — Ambulatory Visit
Admission: RE | Admit: 2023-05-27 | Discharge: 2023-05-27 | Disposition: A | Discharge status: Home / Self Care | Payer: Medicare Other | Source: Ambulatory Visit | Attending: Physician Assistant | Admitting: Physician Assistant

## 2023-05-27 MED ORDER — GADOPICLENOL 0.5 MMOL/ML IV SOLN: 9.0000 mL | Freq: Once | INTRAVENOUS | Status: DC | PRN

## 2023-05-30 ENCOUNTER — Telehealth: Payer: Self-pay | Admitting: Physician Assistant

## 2023-05-30 NOTE — Telephone Encounter (Signed)
Pt requested call back to discuss problems completing MRI was inquiring about an arternative procedure

## 2023-05-30 NOTE — Telephone Encounter (Signed)
Patient states she tried 3 different times to have a MRI done and can not do this because she gets claustrophobic. She states she has tried 3 different places. She states the last places was the better place at all but she still got claustrophobic. Patient states the lady today at the imaging suggested that she would have to go to the hospital and get sedated to have the MRI done if it had to be done. Please advised

## 2023-05-31 NOTE — Telephone Encounter (Signed)
Called and [facility] states they have a open MRI machine and there machine is the biggest on the market. Called central scheduling and got 06/03/2023 [facility] mall at 9:30am for a 10:00am scan. Nothing to eat or drink 4 hours before. Patient verbalized understanding of instructions

## 2023-06-03 ENCOUNTER — Ambulatory Visit: Payer: Medicare Other

## 2023-06-06 ENCOUNTER — Encounter: Payer: Self-pay | Admitting: Physician Assistant

## 2023-06-06 ENCOUNTER — Ambulatory Visit: Payer: Medicare Other | Admitting: Physician Assistant

## 2023-06-07 ENCOUNTER — Telehealth: Payer: Self-pay

## 2023-06-07 ENCOUNTER — Telehealth: Payer: Self-pay | Admitting: Physician Assistant

## 2023-06-07 DIAGNOSIS — K838 Other specified diseases of biliary tract: Secondary | ICD-10-CM

## 2023-06-07 NOTE — Telephone Encounter (Signed)
Patient called in because she is having a hard time finding an MRI.

## 2023-06-07 NOTE — Telephone Encounter (Signed)
Spoke with patient to let her know that Inetta Fermo and Dr.Anna have discussed referring her to Dr.Mansouraty [facility] for EUS procedure. To evaluate dilated common bile duct.

## 2023-06-07 NOTE — Telephone Encounter (Signed)
Patient states she has tried 4 times to have MRI and due to her claustrophobia she cannot do. She has decided to not try anymore and wanted to know if you had anything else for her to do.

## 2023-06-08 ENCOUNTER — Other Ambulatory Visit: Payer: Self-pay

## 2023-06-08 ENCOUNTER — Telehealth: Payer: Self-pay | Admitting: Gastroenterology

## 2023-06-08 DIAGNOSIS — K838 Other specified diseases of biliary tract: Secondary | ICD-10-CM

## 2023-06-08 NOTE — Telephone Encounter (Signed)
Noted  

## 2023-06-08 NOTE — Telephone Encounter (Signed)
Understood and reasonable since she cannot tolerate MRI/MRCP. Proceed with scheduling upper EUS as available. Further evaluation of dilated bile duct status postcholecystectomy. Normal LFTs and lipase. Thanks. GM

## 2023-06-08 NOTE — Telephone Encounter (Signed)
Urgent referral in [place] for a EUS for dilated common bile duct 19 mm.  Records in EPIC.  Please review and advise scheduling.  Thanks

## 2023-06-08 NOTE — Telephone Encounter (Signed)
EUS has been set up for 08/03/23 at 10 am at [facility] with GM  Left message on machine to call back

## 2023-06-08 NOTE — Telephone Encounter (Signed)
Separate telephone message in chart. Placing Patty on this to close the loop on referral. GM

## 2023-06-08 NOTE — Telephone Encounter (Signed)
Thank you :)

## 2023-06-09 NOTE — Telephone Encounter (Signed)
EUS scheduled, pt instructed and medications reviewed.  Patient instructions mailed to home.  Patient to call with any questions or concerns.  

## 2023-06-15 ENCOUNTER — Telehealth: Payer: Self-pay | Admitting: Physician Assistant

## 2023-06-15 NOTE — Telephone Encounter (Signed)
Pt would like call back has a question about upcoming procedure

## 2023-06-26 ENCOUNTER — Encounter: Payer: Self-pay | Admitting: Gastroenterology

## 2023-06-26 NOTE — Anesthesia Preprocedure Evaluation (Addendum)
Anesthesia Evaluation  Patient identified by MRN, date of birth, ID band Patient awake    Reviewed: Allergy & Precautions, H&P , NPO status , Patient's Chart, lab work & pertinent test results  History of Anesthesia Complications (+) PONV and history of anesthetic complications  Airway Mallampati: II  TM Distance: >3 FB Neck ROM: Full    Dental no notable dental hx. (+) Poor Dentition, Upper Dentures   Pulmonary COPD, Current Smoker and Patient abstained from smoking.   Pulmonary exam normal breath sounds clear to auscultation       Cardiovascular hypertension, Normal cardiovascular exam Rhythm:Regular Rate:Normal  2D echocardiogram was performed 03/31/2020 which revealed normal left ventricular function, with LVEF greater than 55%, with trivial valvular insufficiencies.   Neuro/Psych  PSYCHIATRIC DISORDERS Anxiety Depression     Neuromuscular disease  negative psych ROS   GI/Hepatic Neg liver ROS, PUD,GERD  ,,Hx noted per Epic  substance abuse  IV drug use     Endo/Other  negative endocrine ROS    Renal/GU negative Renal ROS  negative genitourinary   Musculoskeletal  (+) Arthritis ,    Abdominal   Peds negative pediatric ROS (+)  Hematology negative hematology ROS (+)   Anesthesia Other Findings Radiculopathy  Tendonitis Hypertension Allergy Hyponatremia, acute on chronic  PONV (postoperative nausea and vomiting) Raynaud's disease  Chronic pain Gait instability Tinnitus Sensorineural hearing loss both ears, Major depressive disorder with anxiety COPD History hiatal hernia repair  Hx noted per Epic:  substance abuse  IV drug use    Reproductive/Obstetrics negative OB ROS                              Anesthesia Physical Anesthesia Plan  ASA: 3  Anesthesia Plan: General   Post-op Pain Management:    Induction: Intravenous  PONV Risk Score and Plan:   Airway  Management Planned: Natural Airway and Nasal Cannula  Additional Equipment:   Intra-op Plan:   Post-operative Plan:   Informed Consent: I have reviewed the patients History and Physical, chart, labs and discussed the procedure including the risks, benefits and alternatives for the proposed anesthesia with the patient or authorized representative who has indicated his/her understanding and acceptance.     Dental Advisory Given  Plan Discussed with: Anesthesiologist, CRNA and Surgeon  Anesthesia Plan Comments: (Patient consented for risks of anesthesia including but not limited to:  - adverse reactions to medications - risk of airway placement if required - damage to eyes, teeth, lips or other oral mucosa - nerve damage due to positioning  - sore throat or hoarseness - Damage to heart, brain, nerves, lungs, other parts of body or loss of life  Patient voiced understanding.)        Anesthesia Quick Evaluation

## 2023-06-28 ENCOUNTER — Telehealth: Payer: Self-pay

## 2023-06-28 MED ORDER — ONDANSETRON HCL 8 MG PO TABS: 8.0000 mg | ORAL_TABLET | Freq: Three times a day (TID) | ORAL | 0 refills | Status: DC | PRN

## 2023-06-28 NOTE — Telephone Encounter (Signed)
Notified patient that Zofran has been sent to pharmacy.

## 2023-06-28 NOTE — Telephone Encounter (Signed)
Patient called in stated that she is unable to drink the medicine for her colonoscopy and she wanted to be transfer to Mrs. Gerre [place] nurse.

## 2023-06-28 NOTE — Telephone Encounter (Signed)
Spoke with patient and she is nauseated all the time-she would like nausea medication to see if it will help with drinking the prep for her colonoscopy scheduled 06-30-23.

## 2023-06-28 NOTE — Telephone Encounter (Signed)
Patient called back in to request the nausea medication I informed her we are waiting on the PA.

## 2023-06-29 ENCOUNTER — Telehealth: Payer: Self-pay

## 2023-06-29 NOTE — Telephone Encounter (Signed)
error 

## 2023-07-27 ENCOUNTER — Telehealth: Payer: Self-pay

## 2023-07-27 ENCOUNTER — Other Ambulatory Visit: Payer: Self-pay

## 2023-07-27 ENCOUNTER — Encounter (HOSPITAL_COMMUNITY): Payer: Self-pay | Admitting: Gastroenterology

## 2023-07-27 ENCOUNTER — Telehealth: Payer: Self-pay | Admitting: Physician Assistant

## 2023-07-27 NOTE — Telephone Encounter (Signed)
Spoke with patient - rescheduled procedures to 08/21/23 with Dr.wohl on 08/21/23 [place]- mailed new instruction today.

## 2023-07-27 NOTE — Telephone Encounter (Addendum)
Patient called because she has 2 procedure done all at one time. The patient asked how will she be able to do it all back to back.

## 2023-08-03 ENCOUNTER — Ambulatory Visit (HOSPITAL_COMMUNITY): Payer: Self-pay | Admitting: Anesthesiology

## 2023-08-03 ENCOUNTER — Ambulatory Visit (HOSPITAL_COMMUNITY)
Admission: RE | Admit: 2023-08-03 | Discharge: 2023-08-03 | Disposition: A | Discharge status: Home / Self Care | Payer: Medicare Other | Attending: Gastroenterology | Admitting: Gastroenterology

## 2023-08-03 ENCOUNTER — Encounter (HOSPITAL_COMMUNITY): Payer: Self-pay | Admitting: Gastroenterology

## 2023-08-03 ENCOUNTER — Encounter (HOSPITAL_COMMUNITY)
Admission: RE | Disposition: A | Discharge status: Home / Self Care | Payer: Self-pay | Source: Home / Self Care | Attending: Gastroenterology

## 2023-08-03 ENCOUNTER — Other Ambulatory Visit: Payer: Self-pay

## 2023-08-03 DIAGNOSIS — K869 Disease of pancreas, unspecified: Secondary | ICD-10-CM | POA: Diagnosis not present

## 2023-08-03 DIAGNOSIS — I7300 Raynaud's syndrome without gangrene: Secondary | ICD-10-CM | POA: Insufficient documentation

## 2023-08-03 DIAGNOSIS — K209 Esophagitis, unspecified without bleeding: Secondary | ICD-10-CM | POA: Diagnosis not present

## 2023-08-03 DIAGNOSIS — F17210 Nicotine dependence, cigarettes, uncomplicated: Secondary | ICD-10-CM | POA: Diagnosis not present

## 2023-08-03 DIAGNOSIS — K259 Gastric ulcer, unspecified as acute or chronic, without hemorrhage or perforation: Secondary | ICD-10-CM

## 2023-08-03 DIAGNOSIS — Z9884 Bariatric surgery status: Secondary | ICD-10-CM | POA: Insufficient documentation

## 2023-08-03 DIAGNOSIS — F17200 Nicotine dependence, unspecified, uncomplicated: Secondary | ICD-10-CM | POA: Insufficient documentation

## 2023-08-03 DIAGNOSIS — K3189 Other diseases of stomach and duodenum: Secondary | ICD-10-CM | POA: Diagnosis not present

## 2023-08-03 DIAGNOSIS — G8929 Other chronic pain: Secondary | ICD-10-CM | POA: Diagnosis not present

## 2023-08-03 DIAGNOSIS — K2970 Gastritis, unspecified, without bleeding: Secondary | ICD-10-CM

## 2023-08-03 DIAGNOSIS — K2289 Other specified disease of esophagus: Secondary | ICD-10-CM | POA: Diagnosis not present

## 2023-08-03 DIAGNOSIS — I10 Essential (primary) hypertension: Secondary | ICD-10-CM | POA: Insufficient documentation

## 2023-08-03 DIAGNOSIS — K838 Other specified diseases of biliary tract: Secondary | ICD-10-CM | POA: Diagnosis present

## 2023-08-03 HISTORY — PX: BIOPSY: SHX5522

## 2023-08-03 HISTORY — PX: ESOPHAGOGASTRODUODENOSCOPY: SHX5428

## 2023-08-03 HISTORY — PX: EUS: SHX5427

## 2023-08-03 SURGERY — UPPER ENDOSCOPIC ULTRASOUND (EUS) RADIAL
Anesthesia: General

## 2023-08-03 MED ORDER — PROPOFOL 10 MG/ML IV BOLUS
INTRAVENOUS | Status: AC
  Filled 2023-08-03: qty 20

## 2023-08-03 MED ORDER — LIDOCAINE 2% (20 MG/ML) 5 ML SYRINGE
INTRAMUSCULAR | Status: DC | PRN
  Administered 2023-08-03: 100 mg via INTRAVENOUS

## 2023-08-03 MED ORDER — PROPOFOL 500 MG/50ML IV EMUL
INTRAVENOUS | Status: DC | PRN
  Administered 2023-08-03: 130 ug/kg/min via INTRAVENOUS

## 2023-08-03 MED ORDER — PROPOFOL 10 MG/ML IV BOLUS
INTRAVENOUS | Status: DC | PRN
  Administered 2023-08-03 (×2): 30 mg via INTRAVENOUS
  Administered 2023-08-03 (×2): 20 mg via INTRAVENOUS

## 2023-08-03 MED ORDER — SODIUM CHLORIDE 0.9 % IV SOLN: INTRAVENOUS | Status: DC | PRN

## 2023-08-03 MED ORDER — PANTOPRAZOLE SODIUM 40 MG PO TBEC
40.0000 mg | DELAYED_RELEASE_TABLET | Freq: Two times a day (BID) | ORAL | 12 refills | Status: AC
Start: 2023-08-03 — End: 2024-08-02

## 2023-08-03 MED ORDER — PROPOFOL 1000 MG/100ML IV EMUL
INTRAVENOUS | Status: AC
  Filled 2023-08-03: qty 100

## 2023-08-03 NOTE — Anesthesia Preprocedure Evaluation (Addendum)
Anesthesia Evaluation  Patient identified by MRN, date of birth, ID band Patient awake    Reviewed: Allergy & Precautions, NPO status , Patient's Chart, lab work & pertinent test results  History of Anesthesia Complications (+) PONV and history of anesthetic complications  Airway Mallampati: I       Dental  (+) Poor Dentition   Pulmonary Current Smoker and Patient abstained from smoking.   Pulmonary exam normal        Cardiovascular hypertension, Pt. on medications Normal cardiovascular exam     Neuro/Psych    GI/Hepatic ,,,(+)     substance abuse  IV drug use  Endo/Other    Renal/GU   negative genitourinary   Musculoskeletal   Abdominal  (+) + obese  Peds  Hematology   Anesthesia Other Findings     Reproductive/Obstetrics                             Anesthesia Physical Anesthesia Plan  ASA: 3  Anesthesia Plan: MAC   Post-op Pain Management:    Induction: Intravenous  PONV Risk Score and Plan: 4 or greater and Ondansetron, Dexamethasone and Treatment may vary due to age or medical condition  Airway Management Planned: Natural Airway and Simple Face Mask  Additional Equipment: None  Intra-op Plan:   Post-operative Plan:   Informed Consent: I have reviewed the patients History and Physical, chart, labs and discussed the procedure including the risks, benefits and alternatives for the proposed anesthesia with the patient or authorized representative who has indicated his/her understanding and acceptance.     Dental Advisory Given  Plan Discussed with: CRNA  Anesthesia Plan Comments:        Anesthesia Quick Evaluation

## 2023-08-03 NOTE — Discharge Instructions (Signed)

## 2023-08-03 NOTE — Op Note (Signed)
[facility] Patient Name: Lori Trevino Procedure Date: 08/03/2023 MRN: [id number] Attending MD: Corliss Parish , MD, [id number] Date of Birth: 06-22-1951 CSN: [id number] Age: 72 Admit Type: Outpatient Procedure:                Upper EUS Indications:              Common bile duct dilation (acquired) seen on CT                            scan, Abdominal bloating Providers:                Corliss Parish, MD, Norman Clay, RN, Harrington Challenger,                            Technician Referring MD:             Wyline Mood MD, MD, [name] MD, MD, Bari Edward, MD, Celso Amy Medicines:                Monitored Anesthesia Care Complications:            No immediate complications. Estimated Blood Loss:     Estimated blood loss was minimal. Procedure:                Pre-Anesthesia Assessment:                           - Prior to the procedure, a History and Physical                            was performed, and patient medications and                            allergies were reviewed. The patient's tolerance of                            previous anesthesia was also reviewed. The risks                            and benefits of the procedure and the sedation                            options and risks were discussed with the patient.                            All questions were answered, and informed consent                            was obtained. Prior Anticoagulants: The patient has                            taken no anticoagulant or antiplatelet agents. ASA  Grade Assessment: III - A patient with severe                            systemic disease. After reviewing the risks and                            benefits, the patient was deemed in satisfactory                            condition to undergo the procedure.                           After obtaining informed consent, the endoscope was                             passed under direct vision. Throughout the                            procedure, the patient's blood pressure, pulse, and                            oxygen saturations were monitored continuously. The                            [id number]-[id number] ([id number]) Olympus endoscope was introduced                            through the mouth, and advanced to the second part                            of duodenum. The [id number]-[id number] ([id number]) Olympus                            duodenoscope was introduced through the mouth, and                            advanced to the area of papilla. The [id number]                            ([id number]) Olympus linear ultrasound scope was                            introduced through the mouth, and advanced to the                            duodenum for ultrasound examination from the                            stomach and duodenum. The upper EUS was                            accomplished without difficulty. The patient  tolerated the procedure. Scope In: Scope Out: Findings:      ENDOSCOPIC FINDING: :      No gross lesions were noted in the proximal esophagus and in the mid       esophagus.      3 islands of salmon-colored mucosa were present from 36 to 38 cm. No       other visible abnormalities were present. Biopsies were taken with a       cold forceps for histology.      LA Grade A (one or more mucosal breaks less than 5 mm, not extending       between tops of 2 mucosal folds) esophagitis with no bleeding was found       in the distal esophagus.      The Z-line was irregular and was found 38 cm from the incisors.      Evidence of a sleeve gastrectomy was found in the gastric body. This was       characterized by congestion and an intact appearance.      Scattered moderate inflammation characterized by congestion (edema),       erosions, erythema, friability was found in the entire examined stomach       with 3 shallow ulcers noted in the  antrum region. Biopsies were taken       with a cold forceps for histology and Helicobacter pylori testing.      No gross lesions were noted in the duodenal bulb, in the first portion       of the duodenum and in the second portion of the duodenum.      The major papilla was normal hidden under hood.      ENDOSONOGRAPHIC FINDING: :      There was dilation in the common bile duct (6.4 ->12.3 -> 15.9 mm) and       in the common hepatic duct (15.6 mm).      Pancreatic parenchymal abnormalities were noted in the entire pancreas.       These consisted of atrophy and lobularity without honeycombing.      The diameter of the main pancreatic duct (MPD) measured:      - HOP 2.7 -> 2.6 mm (head of pancreas)      - NOP 2.5 mm (neck of pancreas)      - BOP 1.9 mm (body of the pancreas)      - TOP 1.3 mm (tail of the pancreas).      Endosonographic imaging of the ampulla showed no intramural       (subepithelial) lesion.      Endosonographic imaging in the visualized portion of the liver showed no       mass.      No malignant-appearing lymph nodes were visualized in the celiac region       (level 20), peripancreatic region and porta hepatis region.      The cealic region was visualized. Impression:               EGD Impression:                           - No gross lesions in the proximal esophagus and in                            the mid esophagus.                           -  Salmon-colored mucosal islands suspicious for                            short-segment Barrett's esophagus. Biopsied.                           - LA Grade A esophagitis with no bleeding found                            distally.                           - Z-line irregular, 38 cm from the incisors.                           - A sleeve gastrectomy was found, characterized by                            an intact appearance and congestion.                           - Gastritis and gastric ulcers. Biopsied for HP                             evaluation.                           - No gross lesions in the duodenal bulb, in the                            first portion of the duodenum and in the second                            portion of the duodenum.                           - Normal major papilla hidden under a hood.                           EUS Impression:                           - There was dilation in the common bile duct and in                            the common hepatic duct.                           - Pancreatic parenchymal abnormalities consisting                            of atrophy and lobularity were noted in the entire                            pancreas.                           -  Main pancreatic duct (MPD) diameter was measured.                            Endosonographically, the MPD had a normal                            appearance with slight prominence in the tail of                            the pancreas.                           - No malignant-appearing lymph nodes were                            visualized in the celiac region (level 20),                            peripancreatic region and porta hepatis region. Moderate Sedation:      Not Applicable - Patient had care per Anesthesia. Recommendation:           - The patient will be observed post-procedure,                            until all discharge criteria are met.                           - Discharge patient to home.                           - Patient has a contact number available for                            emergencies. The signs and symptoms of potential                            delayed complications were discussed with the                            patient. Return to normal activities tomorrow.                            Written discharge instructions were provided to the                            patient.                           - Resume previous diet.                           - Observe patient's clinical  course.                           - Protonix 40 mg BID.                           -  Repeat EGD in 64-months to ensure healing of                            gastric ulcers/gastritis.                           - Await path results.                           - The findings and recommendations were discussed                            with the patient.                           - The findings and recommendations were discussed                            with the designated responsible adult. Procedure Code(s):        --- Professional ---                           [id number], Esophagogastroduodenoscopy, flexible,                            transoral; with endoscopic ultrasound examination                            limited to the esophagus, stomach or duodenum, and                            adjacent structures                           43239, Esophagogastroduodenoscopy, flexible,                            transoral; with biopsy, single or multiple Diagnosis Code(s):        --- Professional ---                           K22.89, Other specified disease of esophagus                           K20.90, Esophagitis, unspecified without bleeding                           Z98.84, Bariatric surgery status                           K29.70, Gastritis, unspecified, without bleeding                           K83.8, Other specified diseases of biliary tract                           K86.9, Disease of pancreas, unspecified  I89.9, Noninfective disorder of lymphatic vessels                            and lymph nodes, unspecified                           R14.0, Abdominal distension (gaseous) CPT copyright 2022 American Medical Association. All rights reserved. The codes documented in this report are preliminary and upon coder review may  be revised to meet current compliance requirements. Corliss Parish, MD 08/03/2023 11:18:24 AM Number of Addenda: 0

## 2023-08-03 NOTE — Anesthesia Postprocedure Evaluation (Signed)
Anesthesia Post Note  Patient: Lori Trevino  Procedure(s) Performed: UPPER ENDOSCOPIC ULTRASOUND (EUS) RADIAL ESOPHAGOGASTRODUODENOSCOPY (EGD) BIOPSY     Patient location during evaluation: Endoscopy Anesthesia Type: General Level of consciousness: awake Pain management: pain level controlled Vital Signs Assessment: post-procedure vital signs reviewed and stable Respiratory status: spontaneous breathing Cardiovascular status: stable Postop Assessment: no apparent nausea or vomiting Anesthetic complications: no  No notable events documented.  Last Vitals:  Vitals:   08/03/23 1103 08/03/23 1116  BP: 101/64 (!) 172/90  Pulse: 60 85  Resp: 16 12  Temp: 36.8 C   SpO2: 100% 97%    Last Pain:  Vitals:   08/03/23 1116  TempSrc:   PainSc: 0-No pain                 Caren Macadam

## 2023-08-03 NOTE — H&P (Signed)
GASTROENTEROLOGY PROCEDURE H&P NOTE   Primary Care Physician: Reubin Milan, MD  HPI: Lori Trevino is a 72 y.o. female who presents for EGD/EUS to evaluate for dilated bile duct.  Past Medical History:  Diagnosis Date   Allergy    Chronic pain    Hypertension    Hyponatremia, acute on chronic 09/13/2021   PONV (postoperative nausea and vomiting)    Radiculopathy    Raynaud's disease    Raynaud's disease    Tendonitis 11/2019   right wrist and right arm   Past Surgical History:  Procedure Laterality Date   AXILLARY SURGERY Right 2008   CHOLECYSTECTOMY     CYSTOCELE REPAIR N/A 02/08/2016   Procedure: ANTERIOR REPAIR (CYSTOCELE);  Surgeon: Suzy Bouchard, MD;  Location: [facility] ORS;  Service: Gynecology;  Laterality: N/A;   LAPAROSCOPIC GASTRIC SLEEVE RESECTION WITH HIATAL HERNIA REPAIR  2015   TONSILLECTOMY     TUBAL LIGATION     VAGINAL HYSTERECTOMY N/A 02/08/2016   Procedure: HYSTERECTOMY VAGINAL WITH BSO;  Surgeon: Suzy Bouchard, MD;  Location: [facility] ORS;  Service: Gynecology;  Laterality: N/A;   VARICOSE VEIN SURGERY     No current facility-administered medications for this encounter.   Current Outpatient Medications  Medication Sig Dispense Refill   B Complex-Biotin-FA (B-COMPLEX PO) Take 1 tablet by mouth daily.     cyclobenzaprine (FLEXERIL) 10 MG tablet TAKE 1/2-1 TABLET BY MOUTH 3 TIMES A DAY AS NEEDED     methadone (DOLOPHINE) 1 mg/ml oral solution Take by mouth daily. 100 mg     Multiple Vitamin (ONE-A-DAY 55 PLUS PO) Take 1 tablet by mouth daily.     olmesartan (BENICAR) 20 MG tablet TAKE 1 TABLET BY MOUTH EVERY DAY 90 tablet 0   ondansetron (ZOFRAN) 8 MG tablet Take 1 tablet (8 mg total) by mouth every 8 (eight) hours as needed for nausea or vomiting. 20 tablet 0   ondansetron (ZOFRAN-ODT) 8 MG disintegrating tablet Take 1 tablet (8 mg total) by mouth every 8 (eight) hours as needed for nausea or vomiting. 20 tablet 0   pantoprazole (PROTONIX) 40  MG tablet Take 1 tablet (40 mg total) by mouth 2 (two) times daily before a meal. 60 tablet 12   albuterol (VENTOLIN HFA) 108 (90 Base) MCG/ACT inhaler TAKE 2 PUFFS BY MOUTH EVERY 6 HOURS AS NEEDED FOR WHEEZE OR SHORTNESS OF BREATH (Patient not taking: Reported on 06/26/2023) 8.5 each 1   fluticasone (FLONASE) 50 MCG/ACT nasal spray PLACE 1 SPRAY INTO BOTH NOSTRILS DAILY. (Patient not taking: Reported on 06/26/2023) 16 mL 2   loratadine (CLARITIN) 10 MG tablet Take 10 mg by mouth daily as needed for allergies. (Patient not taking: Reported on 06/26/2023)     No current facility-administered medications for this encounter.  Current Outpatient Medications:    B Complex-Biotin-FA (B-COMPLEX PO), Take 1 tablet by mouth daily., Disp: , Rfl:    cyclobenzaprine (FLEXERIL) 10 MG tablet, TAKE 1/2-1 TABLET BY MOUTH 3 TIMES A DAY AS NEEDED, Disp: , Rfl:    methadone (DOLOPHINE) 1 mg/ml oral solution, Take by mouth daily. 100 mg, Disp: , Rfl:    Multiple Vitamin (ONE-A-DAY 55 PLUS PO), Take 1 tablet by mouth daily., Disp: , Rfl:    olmesartan (BENICAR) 20 MG tablet, TAKE 1 TABLET BY MOUTH EVERY DAY, Disp: 90 tablet, Rfl: 0   ondansetron (ZOFRAN) 8 MG tablet, Take 1 tablet (8 mg total) by mouth every 8 (eight) hours as needed for nausea or  vomiting., Disp: 20 tablet, Rfl: 0   ondansetron (ZOFRAN-ODT) 8 MG disintegrating tablet, Take 1 tablet (8 mg total) by mouth every 8 (eight) hours as needed for nausea or vomiting., Disp: 20 tablet, Rfl: 0   pantoprazole (PROTONIX) 40 MG tablet, Take 1 tablet (40 mg total) by mouth 2 (two) times daily before a meal., Disp: 60 tablet, Rfl: 12   albuterol (VENTOLIN HFA) 108 (90 Base) MCG/ACT inhaler, TAKE 2 PUFFS BY MOUTH EVERY 6 HOURS AS NEEDED FOR WHEEZE OR SHORTNESS OF BREATH (Patient not taking: Reported on 06/26/2023), Disp: 8.5 each, Rfl: 1   fluticasone (FLONASE) 50 MCG/ACT nasal spray, PLACE 1 SPRAY INTO BOTH NOSTRILS DAILY. (Patient not taking: Reported on 06/26/2023),  Disp: 16 mL, Rfl: 2   loratadine (CLARITIN) 10 MG tablet, Take 10 mg by mouth daily as needed for allergies. (Patient not taking: Reported on 06/26/2023), Disp: , Rfl:  Allergies  Allergen Reactions   Neurontin [Gabapentin] Swelling   Zolpidem Other (See Comments)    sleepwalking    Family History  Problem Relation Age of Onset   Stroke Mother    Cancer Father    Breast cancer Neg Hx    Social History   Socioeconomic History   Marital status: Divorced    Spouse name: N/A   Number of children: 0   Years of education: 12   Highest education level: Some college, no degree  Occupational History   Not on file  Tobacco Use   Smoking status: Every Day    Current packs/day: 0.10    Average packs/day: 0.1 packs/day for 60.0 years (6.0 ttl pk-yrs)    Types: Cigarettes   Smokeless tobacco: Never   Tobacco comments:    2-3 daily  Vaping Use   Vaping status: Never Used  Substance and Sexual Activity   Alcohol use: Not Currently    Alcohol/week: 0.0 standard drinks of alcohol   Drug use: No   Sexual activity: Yes  Other Topics Concern   Not on file  Social History Narrative   Not on file   Social Determinants of Health   Financial Resource Strain: Low Risk  (08/03/2022)   Overall Financial Resource Strain (CARDIA)    Difficulty of Paying Living Expenses: Not hard at all  Food Insecurity: No Food Insecurity (08/03/2022)   Hunger Vital Sign    Worried About Running Out of Food in the Last Year: Never true    Ran Out of Food in the Last Year: Never true  Transportation Needs: No Transportation Needs (08/03/2022)   PRAPARE - Administrator, Civil Service (Medical): No    Lack of Transportation (Non-Medical): No  Physical Activity: Inactive (08/03/2022)   Exercise Vital Sign    Days of Exercise per Week: 0 days    Minutes of Exercise per Session: 0 min  Stress: No Stress Concern Present (08/03/2022)   [employer] of Occupational Health - Occupational Stress  Questionnaire    Feeling of Stress : Only a little  Social Connections: Socially Isolated (08/03/2022)   Social Connection and Isolation Panel [NHANES]    Frequency of Communication with Friends and Family: Three times a week    Frequency of Social Gatherings with Friends and Family: Three times a week    Attends Religious Services: Never    Active Member of Clubs or Organizations: No    Attends Banker Meetings: Never    Marital Status: Divorced  Intimate Partner Violence: Not At Risk (08/03/2022)  Humiliation, Afraid, Rape, and Kick questionnaire    Fear of Current or Ex-Partner: No    Emotionally Abused: No    Physically Abused: No    Sexually Abused: No    Physical Exam: Today's Vitals   08/03/23 1103 08/03/23 1116 08/03/23 1130 08/03/23 1137  BP: 101/64 (!) 172/90 (!) 163/87 (!) 158/83  Pulse: 60 85 67 68  Resp: 16 12 18 18   Temp: 98.2 F (36.8 C)     TempSrc: Temporal     SpO2: 100% 97% 96% 98%  Weight:      Height:      PainSc: 0-No pain 0-No pain 0-No pain 0-No pain   Body mass index is 34.76 kg/m. GEN: NAD EYE: Sclerae anicteric ENT: MMM CV: Non-tachycardic GI: Soft, NT/ND NEURO:  Alert & Oriented x 3  Lab Results: No results for input(s): "WBC", "HGB", "HCT", "PLT" in the last 72 hours. BMET No results for input(s): "NA", "K", "CL", "CO2", "GLUCOSE", "BUN", "CREATININE", "CALCIUM" in the last 72 hours. LFT No results for input(s): "PROT", "ALBUMIN", "AST", "ALT", "ALKPHOS", "BILITOT", "BILIDIR", "IBILI" in the last 72 hours. PT/INR No results for input(s): "LABPROT", "INR" in the last 72 hours.   Impression / Plan: This is a 72 y.o.female who presents for EGD/EUS to evaluate for dilated bile duct.  The risks of an EUS including intestinal perforation, bleeding, infection, aspiration, and medication effects were discussed as was the possibility it may not give a definitive diagnosis if a biopsy is performed.  When a biopsy of the pancreas is  done as part of the EUS, there is an additional risk of pancreatitis at the rate of about 1-2%.  It was explained that procedure related pancreatitis is typically mild, although it can be severe and even life threatening, which is why we do not perform random pancreatic biopsies and only biopsy a lesion/area we feel is concerning enough to warrant the risk.   The risks and benefits of endoscopic evaluation/treatment were discussed with the patient and/or family; these include but are not limited to the risk of perforation, infection, bleeding, missed lesions, lack of diagnosis, severe illness requiring hospitalization, as well as anesthesia and sedation related illnesses.  The patient's history has been reviewed, patient examined, no change in status, and deemed stable for procedure.  The patient and/or family is agreeable to proceed.    Corliss Parish, MD [facility] Advanced [facility]oscopy Office # [phone]

## 2023-08-03 NOTE — Transfer of Care (Signed)
Immediate Anesthesia Transfer of Care Note  Patient: Lori Trevino  Procedure(s) Performed: UPPER ENDOSCOPIC ULTRASOUND (EUS) RADIAL ESOPHAGOGASTRODUODENOSCOPY (EGD) BIOPSY  Patient Location: PACU  Anesthesia Type:MAC  Level of Consciousness: sedated  Airway & Oxygen Therapy: Patient Spontanous Breathing and Patient connected to face mask oxygen  Post-op Assessment: Report given to RN and Post -op Vital signs reviewed and stable  Post vital signs: Reviewed and stable  Last Vitals:  Vitals Value Taken Time  BP    Temp    Pulse    Resp    SpO2      Last Pain:  Vitals:   08/03/23 0849  TempSrc: Temporal  PainSc: 0-No pain         Complications: No notable events documented.

## 2023-08-04 ENCOUNTER — Encounter (HOSPITAL_COMMUNITY): Payer: Self-pay | Admitting: Gastroenterology

## 2023-08-11 ENCOUNTER — Telehealth: Payer: Self-pay | Admitting: Internal Medicine

## 2023-08-11 NOTE — Telephone Encounter (Signed)
Copied from CRM [id number]. Topic: Medicare AWV >> Aug 11, 2023  2:20 PM Payton Doughty wrote: Reason for CRM: LVM 08/11/23 AWV scheduled in telehealth appt. AWV need to be r/s or keep as telehealth appt [facility]  Verlee Rossetti; Care Guide Ambulatory Clinical Support [place] [facility] Direct Dial: [phone]

## 2023-08-14 LAB — SURGICAL PATHOLOGY

## 2023-08-15 ENCOUNTER — Encounter: Payer: Self-pay | Admitting: Gastroenterology

## 2023-08-18 ENCOUNTER — Telehealth: Payer: Self-pay

## 2023-08-18 NOTE — Telephone Encounter (Signed)
Patient called because she states she loss the second page of her instructions for her colonoscopy. Her colonoscopy is schedule for Monday. I asked her if she could get on [facility] mychart and explain on how she could find the instructions on her mychart. She states she always has trouble with that. I also explained the instructions to her and she wrote it down of what she needed to do.

## 2023-08-21 ENCOUNTER — Encounter
Admission: RE | Disposition: A | Discharge status: Home / Self Care | Payer: Self-pay | Source: Ambulatory Visit | Attending: Gastroenterology

## 2023-08-21 ENCOUNTER — Ambulatory Visit
Admission: RE | Admit: 2023-08-21 | Discharge: 2023-08-21 | Disposition: A | Discharge status: Home / Self Care | Payer: Medicare Other | Source: Ambulatory Visit | Attending: Gastroenterology | Admitting: Gastroenterology

## 2023-08-21 ENCOUNTER — Encounter: Payer: Self-pay | Admitting: Gastroenterology

## 2023-08-21 ENCOUNTER — Ambulatory Visit: Payer: Medicare Other | Admitting: Anesthesiology

## 2023-08-21 ENCOUNTER — Other Ambulatory Visit: Payer: Self-pay

## 2023-08-21 DIAGNOSIS — D509 Iron deficiency anemia, unspecified: Secondary | ICD-10-CM | POA: Diagnosis present

## 2023-08-21 DIAGNOSIS — D649 Anemia, unspecified: Secondary | ICD-10-CM

## 2023-08-21 DIAGNOSIS — F17210 Nicotine dependence, cigarettes, uncomplicated: Secondary | ICD-10-CM | POA: Insufficient documentation

## 2023-08-21 DIAGNOSIS — M1990 Unspecified osteoarthritis, unspecified site: Secondary | ICD-10-CM | POA: Insufficient documentation

## 2023-08-21 DIAGNOSIS — I7300 Raynaud's syndrome without gangrene: Secondary | ICD-10-CM | POA: Diagnosis not present

## 2023-08-21 DIAGNOSIS — J449 Chronic obstructive pulmonary disease, unspecified: Secondary | ICD-10-CM | POA: Insufficient documentation

## 2023-08-21 DIAGNOSIS — F32A Depression, unspecified: Secondary | ICD-10-CM | POA: Insufficient documentation

## 2023-08-21 DIAGNOSIS — I10 Essential (primary) hypertension: Secondary | ICD-10-CM | POA: Diagnosis not present

## 2023-08-21 DIAGNOSIS — K279 Peptic ulcer, site unspecified, unspecified as acute or chronic, without hemorrhage or perforation: Secondary | ICD-10-CM | POA: Insufficient documentation

## 2023-08-21 DIAGNOSIS — K219 Gastro-esophageal reflux disease without esophagitis: Secondary | ICD-10-CM | POA: Insufficient documentation

## 2023-08-21 DIAGNOSIS — F419 Anxiety disorder, unspecified: Secondary | ICD-10-CM | POA: Insufficient documentation

## 2023-08-21 HISTORY — DX: Tinnitus, unspecified ear: H93.19

## 2023-08-21 HISTORY — DX: Chronic obstructive pulmonary disease, unspecified: J44.9

## 2023-08-21 HISTORY — DX: Other chronic pain: G89.29

## 2023-08-21 HISTORY — DX: Other specified anxiety disorders: F41.8

## 2023-08-21 HISTORY — PX: COLONOSCOPY WITH PROPOFOL: SHX5780

## 2023-08-21 HISTORY — DX: Sensorineural hearing loss, bilateral: H90.3

## 2023-08-21 HISTORY — DX: Personal history of other diseases of the digestive system: Z87.19

## 2023-08-21 HISTORY — DX: Raynaud's syndrome without gangrene: I73.00

## 2023-08-21 SURGERY — COLONOSCOPY WITH PROPOFOL
Anesthesia: General

## 2023-08-21 MED ORDER — PROPOFOL 10 MG/ML IV BOLUS
INTRAVENOUS | Status: DC | PRN
  Administered 2023-08-21: 100 mg via INTRAVENOUS

## 2023-08-21 MED ORDER — SODIUM CHLORIDE 0.9 % IV SOLN: INTRAVENOUS | Status: DC

## 2023-08-21 MED ORDER — PROPOFOL 500 MG/50ML IV EMUL
INTRAVENOUS | Status: DC | PRN
  Administered 2023-08-21: 140 ug/kg/min via INTRAVENOUS

## 2023-08-21 MED ORDER — LACTATED RINGERS IV SOLN: INTRAVENOUS | Status: DC

## 2023-08-21 SURGICAL SUPPLY — 36 items
BALLN DILATOR 12-15 8 (BALLOONS)
BALLN DILATOR 15-18 8 (BALLOONS)
BALLN DILATOR CRE 0-12 8 (BALLOONS)
BALLN DILATOR ESOPH 8 10 CRE (MISCELLANEOUS) IMPLANT
BALLOON DILATOR 12-15 8 (BALLOONS) IMPLANT
BALLOON DILATOR 15-18 8 (BALLOONS) IMPLANT
BALLOON DILATOR CRE 0-12 8 (BALLOONS) IMPLANT
BLOCK BITE 60FR ADLT L/F GRN (MISCELLANEOUS) ×1 IMPLANT
CLIP HMST 235XBRD CATH ROT (MISCELLANEOUS) IMPLANT
CLIP RESOLUTION 360 11X235 (MISCELLANEOUS)
ELECT REM PT RETURN 9FT ADLT (ELECTROSURGICAL)
ELECTRODE REM PT RTRN 9FT ADLT (ELECTROSURGICAL) IMPLANT
FCP ESCP3.2XJMB 240X2.8X (MISCELLANEOUS)
FORCEPS BIOP RAD 4 LRG CAP 4 (CUTTING FORCEPS) IMPLANT
FORCEPS BIOP RJ4 240 W/NDL (MISCELLANEOUS)
FORCEPS ESCP3.2XJMB 240X2.8X (MISCELLANEOUS) IMPLANT
GOWN CVR UNV OPN BCK APRN NK (MISCELLANEOUS) ×2 IMPLANT
GOWN ISOL THUMB LOOP REG UNIV (MISCELLANEOUS) ×2
INJECTOR VARIJECT VIN23 (MISCELLANEOUS) IMPLANT
KIT DEFENDO VALVE AND CONN (KITS) IMPLANT
KIT PRC NS LF DISP ENDO (KITS) ×1 IMPLANT
KIT PROCEDURE OLYMPUS (KITS) ×1
MANIFOLD NEPTUNE II (INSTRUMENTS) ×1 IMPLANT
MARKER SPOT ENDO TATTOO 5ML (MISCELLANEOUS) IMPLANT
PROBE APC STR FIRE (PROBE) IMPLANT
RETRIEVER NET PLAT FOOD (MISCELLANEOUS) IMPLANT
RETRIEVER NET ROTH 2.5X230 LF (MISCELLANEOUS) IMPLANT
SNARE COLD EXACTO (MISCELLANEOUS) IMPLANT
SNARE SHORT THROW 13M SML OVAL (MISCELLANEOUS) IMPLANT
SNARE SHORT THROW 30M LRG OVAL (MISCELLANEOUS) IMPLANT
SNARE SNG USE RND 15MM (INSTRUMENTS) IMPLANT
SYR INFLATION 60ML (SYRINGE) IMPLANT
TRAP ETRAP POLY (MISCELLANEOUS) IMPLANT
VARIJECT INJECTOR VIN23 (MISCELLANEOUS)
WATER STERILE IRR 250ML POUR (IV SOLUTION) ×1 IMPLANT
WIRE CRE 18-20MM 8CM F G (MISCELLANEOUS) IMPLANT

## 2023-08-21 NOTE — Op Note (Signed)
[facility] Gastroenterology Patient Name: Lori Trevino Procedure Date: 08/21/2023 9:34 AM MRN: [id number] Account #: [id number] Date of Birth: [birth year] Admit Type: Outpatient Age: 72 Room: [facility] OR ROOM 01 Gender: Female Note Status: Finalized Instrument Name: [id number] Procedure:             Colonoscopy Indications:           Iron deficiency anemia Providers:             [name] MD, MD Referring MD:          Bari Edward, MD (Referring MD) Medicines:             Propofol per Anesthesia Complications:         No immediate complications. Procedure:             Pre-Anesthesia Assessment:                        - Prior to the procedure, a History and Physical was                         performed, and patient medications and allergies were                         reviewed. The patient's tolerance of previous                         anesthesia was also reviewed. The risks and benefits                         of the procedure and the sedation options and risks                         were discussed with the patient. All questions were                         answered, and informed consent was obtained. Prior                         Anticoagulants: The patient has taken no anticoagulant                         or antiplatelet agents. ASA Grade Assessment: II - A                         patient with mild systemic disease. After reviewing                         the risks and benefits, the patient was deemed in                         satisfactory condition to undergo the procedure.                        After obtaining informed consent, the colonoscope was                         passed under direct vision. Throughout the procedure,  the patient's blood pressure, pulse, and oxygen                         saturations were monitored continuously. The was                         introduced through the anus and advanced to the the                          descending colon. The colonoscopy was performed                         without difficulty. The patient tolerated the                         procedure well. The quality of the bowel preparation                         was not adequate to identify polyps greater than 5 mm                         in size. Findings:      The perianal and digital rectal examinations were normal.      A large amount of stool was found in the recto-sigmoid colon, in the       sigmoid colon and in the descending colon, precluding visualization. Impression:            - Preparation of the colon was inadequate.                        - Stool in the recto-sigmoid colon, in the sigmoid                         colon and in the descending colon.                        - No specimens collected. Recommendation:        - Discharge patient to home.                        - Resume previous diet.                        - Continue present medications.                        - Repeat colonoscopy because the bowel preparation was                         poor. Procedure Code(s):     --- Professional ---                        [phone], 53, Colonoscopy, flexible; diagnostic,                         including collection of specimen(s) by brushing or                         washing, when performed (separate procedure) Diagnosis Code(s):     ---  Professional ---                        D50.9, Iron deficiency anemia, unspecified CPT copyright 2022 American Medical Association. All rights reserved. The codes documented in this report are preliminary and upon coder review may  be revised to meet current compliance requirements. [name] MD, MD 08/21/2023 9:51:11 AM This report has been signed electronically. Number of Addenda: 0 Note Initiated On: 08/21/2023 9:34 AM Total Procedure Duration: 0 hours 3 minutes 8 seconds  Estimated Blood Loss:  Estimated blood loss: none.      [facility]

## 2023-08-21 NOTE — Transfer of Care (Signed)
Immediate Anesthesia Transfer of Care Note  Patient: Lori Trevino  Procedure(s) Performed: COLONOSCOPY WITH PROPOFOL  Patient Location: PACU  Anesthesia Type:General  Level of Consciousness: awake, alert , and oriented  Airway & Oxygen Therapy: Patient Spontanous Breathing  Post-op Assessment: Report given to RN and Post -op Vital signs reviewed and stable  Post vital signs: Reviewed and stable  Last Vitals:  Vitals Value Taken Time  BP 112/62 08/21/23 0952  Temp    Pulse 65 08/21/23 0954  Resp 18 08/21/23 0954  SpO2 95 % 08/21/23 0954  Vitals shown include unfiled device data.  Last Pain:  Vitals:   08/21/23 0832  TempSrc: Temporal  PainSc: 0-No pain         Complications: No notable events documented.

## 2023-08-21 NOTE — H&P (Signed)
Lori Minium, MD [facility] [address] [place], [place] [address] [phone] Fax : [phone]  Primary Care Physician:  Lori Milan, MD Primary Gastroenterologist:  Dr. Servando Snare  Pre-Procedure History & Physical: HPI:  Lori Trevino is a 72 y.o. female is here for an colonoscopy.   Past Medical History:  Diagnosis Date   Allergy    Chronic pain    Hypertension    Hyponatremia, acute on chronic 09/13/2021   PONV (postoperative nausea and vomiting)    Radiculopathy    Raynaud's disease    Raynaud's disease    Tendonitis 11/2019   right wrist and right arm    Past Surgical History:  Procedure Laterality Date   AXILLARY SURGERY Right 2008   BIOPSY  08/03/2023   Procedure: BIOPSY;  Surgeon: Lemar Lofty., MD;  Location: [facility];  Service: Gastroenterology;;   CHOLECYSTECTOMY     CYSTOCELE REPAIR N/A 02/08/2016   Procedure: ANTERIOR REPAIR (CYSTOCELE);  Surgeon: Suzy Bouchard, MD;  Location: [facility];  Service: Gynecology;  Laterality: N/A;   ESOPHAGOGASTRODUODENOSCOPY N/A 08/03/2023   Procedure: ESOPHAGOGASTRODUODENOSCOPY (EGD);  Surgeon: Lemar Lofty., MD;  Location: [facility];  Service: Gastroenterology;  Laterality: N/A;   EUS N/A 08/03/2023   Procedure: UPPER ENDOSCOPIC ULTRASOUND (EUS) RADIAL;  Surgeon: Lemar Lofty., MD;  Location: [facility];  Service: Gastroenterology;  Laterality: N/A;   LAPAROSCOPIC GASTRIC SLEEVE RESECTION WITH HIATAL HERNIA REPAIR  2015   TONSILLECTOMY     TUBAL LIGATION     VAGINAL HYSTERECTOMY N/A 02/08/2016   Procedure: HYSTERECTOMY VAGINAL WITH BSO;  Surgeon: Suzy Bouchard, MD;  Location: [facility];  Service: Gynecology;  Laterality: N/A;   VARICOSE VEIN SURGERY      Prior to Admission medications   Medication Sig Start Date End Date Taking? Authorizing Provider  cyclobenzaprine (FLEXERIL) 10 MG tablet TAKE 1/2-1 TABLET BY MOUTH 3 TIMES A DAY AS NEEDED 12/10/20  Yes  [provider]  methadone (DOLOPHINE) 1 mg/ml oral solution Take by mouth daily. 100 mg   Yes [provider]  olmesartan (BENICAR) 20 MG tablet TAKE 1 TABLET BY MOUTH EVERY DAY 05/25/23  Yes Lori Milan, MD  ondansetron (ZOFRAN) 8 MG tablet Take 1 tablet (8 mg total) by mouth every 8 (eight) hours as needed for nausea or vomiting. 06/28/23  Yes Celso Amy, PA-C  pantoprazole (PROTONIX) 40 MG tablet Take 1 tablet (40 mg total) by mouth 2 (two) times daily before a meal. 08/03/23 08/02/24 Yes Mansouraty, Netty Starring., MD  albuterol (VENTOLIN HFA) 108 (90 Base) MCG/ACT inhaler TAKE 2 PUFFS BY MOUTH EVERY 6 HOURS AS NEEDED FOR WHEEZE OR SHORTNESS OF BREATH Patient not taking: Reported on 06/26/2023 10/19/21   Lori Milan, MD  B Complex-Biotin-FA (B-COMPLEX PO) Take 1 tablet by mouth daily.    [provider]  fluticasone (FLONASE) 50 MCG/ACT nasal spray PLACE 1 SPRAY INTO BOTH NOSTRILS DAILY. Patient not taking: Reported on 06/26/2023 10/30/20   Lori Milan, MD  loratadine (CLARITIN) 10 MG tablet Take 10 mg by mouth daily as needed for allergies. Patient not taking: Reported on 06/26/2023    [provider]  Multiple Vitamin (ONE-A-DAY 55 PLUS PO) Take 1 tablet by mouth daily.    [provider]  ondansetron (ZOFRAN-ODT) 8 MG disintegrating tablet Take 1 tablet (8 mg total) by mouth every 8 (eight) hours as needed for nausea or vomiting. 03/27/23   Merwyn Katos, MD  amLODipine (NORVASC) 5  MG tablet TAKE 1 TABLET (5 MG TOTAL) BY MOUTH DAILY. FOR HIGH BLOOD PRESSURE 09/09/19 12/18/19  Lori Milan, MD    Allergies as of 04/05/2023 - Review Complete 04/05/2023  Allergen Reaction Noted   Neurontin [gabapentin] Swelling 06/30/2017   Zolpidem Other (See Comments) 06/12/2014    Family History  Problem Relation Age of Onset   Stroke Mother    Cancer Father    Breast cancer Neg Hx     Social History   Socioeconomic History   Marital  status: Divorced    Spouse name: N/A   Number of children: 0   Years of education: 12   Highest education level: Some college, no degree  Occupational History   Not on file  Tobacco Use   Smoking status: Every Day    Current packs/day: 0.10    Average packs/day: 0.1 packs/day for 60.0 years (6.0 ttl pk-yrs)    Types: Cigarettes   Smokeless tobacco: Never   Tobacco comments:    2-3 daily  Vaping Use   Vaping status: Never Used  Substance and Sexual Activity   Alcohol use: Not Currently    Alcohol/week: 0.0 standard drinks of alcohol   Drug use: No   Sexual activity: Yes  Other Topics Concern   Not on file  Social History Narrative   Not on file   Social Determinants of Health   Financial Resource Strain: Low Risk  (08/03/2022)   Overall Financial Resource Strain (CARDIA)    Difficulty of Paying Living Expenses: Not hard at all  Food Insecurity: No Food Insecurity (08/03/2022)   Hunger Vital Sign    Worried About Running Out of Food in the Last Year: Never true    Ran Out of Food in the Last Year: Never true  Transportation Needs: No Transportation Needs (08/03/2022)   PRAPARE - Administrator, Civil Service (Medical): No    Lack of Transportation (Non-Medical): No  Physical Activity: Inactive (08/03/2022)   Exercise Vital Sign    Days of Exercise per Week: 0 days    Minutes of Exercise per Session: 0 min  Stress: No Stress Concern Present (08/03/2022)   [employer] of Occupational Health - Occupational Stress Questionnaire    Feeling of Stress : Only a little  Social Connections: Socially Isolated (08/03/2022)   Social Connection and Isolation Panel [NHANES]    Frequency of Communication with Friends and Family: Three times a week    Frequency of Social Gatherings with Friends and Family: Three times a week    Attends Religious Services: Never    Active Member of Clubs or Organizations: No    Attends Banker Meetings: Never    Marital  Status: Divorced  Catering manager Violence: Not At Risk (08/03/2022)   Humiliation, Afraid, Rape, and Kick questionnaire    Fear of Current or Ex-Partner: No    Emotionally Abused: No    Physically Abused: No    Sexually Abused: No    Review of Systems: See HPI, otherwise negative ROS  Physical Exam: Ht 5\' 2"  (1.575 m)   Wt 86.2 kg   BMI 34.75 kg/m  General:   Alert,  pleasant and cooperative in NAD Head:  Normocephalic and atraumatic. Neck:  Supple; no masses or thyromegaly. Lungs:  Clear throughout to auscultation.    Heart:  Regular rate and rhythm. Abdomen:  Soft, nontender and nondistended. Normal bowel sounds, without guarding, and without rebound.   Neurologic:  Alert and  oriented  x4;  grossly normal neurologically.  Impression/Plan: Leanny Kelder is here for an colonoscopy to be performed for abnormal CT scan of the colon   Risks, benefits, limitations, and alternatives regarding  colonoscopy have been reviewed with the patient.  Questions have been answered.  All parties agreeable.   Lori Minium, MD  08/21/2023, 8:36 AM

## 2023-08-21 NOTE — Anesthesia Postprocedure Evaluation (Signed)
Anesthesia Post Note  Patient: Lori Trevino  Procedure(s) Performed: COLONOSCOPY WITH PROPOFOL  Patient location during evaluation: PACU Anesthesia Type: General Level of consciousness: awake and alert Pain management: pain level controlled Vital Signs Assessment: post-procedure vital signs reviewed and stable Respiratory status: spontaneous breathing, nonlabored ventilation, respiratory function stable and patient connected to nasal cannula oxygen Cardiovascular status: blood pressure returned to baseline and stable Postop Assessment: no apparent nausea or vomiting Anesthetic complications: no   No notable events documented.   Last Vitals:  Vitals:   08/21/23 0953 08/21/23 1000  BP: 112/62 119/73  Pulse: 68 67  Resp: (!) 9 17  Temp:    SpO2: 97% 95%    Last Pain:  Vitals:   08/21/23 0832  TempSrc: Temporal  PainSc: 0-No pain                 Maxton Noreen C Sanders Manninen

## 2023-08-22 ENCOUNTER — Encounter: Payer: Self-pay | Admitting: Gastroenterology

## 2023-08-24 ENCOUNTER — Ambulatory Visit: Payer: Medicare Other | Admitting: Emergency Medicine

## 2023-08-24 ENCOUNTER — Ambulatory Visit: Payer: Medicare Other

## 2023-08-24 VITALS — Ht 64.0 in | Wt 180.0 lb

## 2023-08-24 DIAGNOSIS — Z1231 Encounter for screening mammogram for malignant neoplasm of breast: Secondary | ICD-10-CM

## 2023-08-24 DIAGNOSIS — Z780 Asymptomatic menopausal state: Secondary | ICD-10-CM | POA: Diagnosis not present

## 2023-08-24 DIAGNOSIS — Z0000 Encounter for general adult medical examination without abnormal findings: Secondary | ICD-10-CM | POA: Diagnosis not present

## 2023-08-24 NOTE — Progress Notes (Signed)
Subjective:   Lori Trevino is a 72 y.o. female who presents for Medicare Annual (Subsequent) preventive examination.  Visit Complete: Virtual I connected with  [name] on 08/24/23 by a audio enabled telemedicine application and verified that I am speaking with the correct person using two identifiers.  Patient Location: Home  Provider Location: Home Office  I discussed the limitations of evaluation and management by telemedicine. The patient expressed understanding and agreed to proceed.  Vital Signs: Because this visit was a virtual/telehealth visit, some criteria may be missing or patient reported. Any vitals not documented were not able to be obtained and vitals that have been documented are patient reported.   Cardiac Risk Factors include: advanced age (>110men, >20 women);hypertension;dyslipidemia;obesity (BMI >30kg/m2)     Objective:    Today's Vitals   08/24/23 1544  Weight: 180 lb (81.6 kg)  Height: 5\' 4"  (1.626 m)   Body mass index is 30.9 kg/m.     08/24/2023    3:57 PM 08/21/2023    8:17 AM 08/03/2023    8:46 AM 03/27/2023   12:34 PM 08/03/2022   11:04 AM 09/12/2021    8:05 PM 06/12/2021    9:58 AM  Advanced Directives  Does Patient Have a Medical Advance Directive? No No No No No No No  Would patient like information on creating a medical advance directive? Yes (MAU/Ambulatory/Procedural Areas - Information given) No - Patient declined No - Patient declined  No - Patient declined No - Patient declined No - Patient declined    Current Medications (verified) Outpatient Encounter Medications as of 08/24/2023  Medication Sig   cyclobenzaprine (FLEXERIL) 10 MG tablet TAKE 1/2-1 TABLET BY MOUTH 3 TIMES A DAY AS NEEDED   fluticasone (FLONASE) 50 MCG/ACT nasal spray PLACE 1 SPRAY INTO BOTH NOSTRILS DAILY.   methadone (DOLOPHINE) 1 mg/ml oral solution Take by mouth daily. 100 mg   olmesartan (BENICAR) 20 MG tablet TAKE 1 TABLET BY MOUTH EVERY DAY   pantoprazole  (PROTONIX) 40 MG tablet Take 1 tablet (40 mg total) by mouth 2 (two) times daily before a meal.   albuterol (VENTOLIN HFA) 108 (90 Base) MCG/ACT inhaler TAKE 2 PUFFS BY MOUTH EVERY 6 HOURS AS NEEDED FOR WHEEZE OR SHORTNESS OF BREATH (Patient not taking: Reported on 06/26/2023)   B Complex-Biotin-FA (B-COMPLEX PO) Take 1 tablet by mouth daily. (Patient not taking: Reported on 08/24/2023)   loratadine (CLARITIN) 10 MG tablet Take 10 mg by mouth daily as needed for allergies. (Patient not taking: Reported on 08/24/2023)   Multiple Vitamin (ONE-A-DAY 55 PLUS PO) Take 1 tablet by mouth daily. (Patient not taking: Reported on 08/24/2023)   ondansetron (ZOFRAN) 8 MG tablet Take 1 tablet (8 mg total) by mouth every 8 (eight) hours as needed for nausea or vomiting. (Patient not taking: Reported on 08/24/2023)   ondansetron (ZOFRAN-ODT) 8 MG disintegrating tablet Take 1 tablet (8 mg total) by mouth every 8 (eight) hours as needed for nausea or vomiting. (Patient not taking: Reported on 08/24/2023)   [DISCONTINUED] amLODipine (NORVASC) 5 MG tablet TAKE 1 TABLET (5 MG TOTAL) BY MOUTH DAILY. FOR HIGH BLOOD PRESSURE   No facility-administered encounter medications on file as of 08/24/2023.    Allergies (verified) Neurontin [gabapentin] and Zolpidem   History: Past Medical History:  Diagnosis Date   Allergy    Chronic pain    COPD (chronic obstructive pulmonary disease) (HCC)    Depression with anxiety    History of repair of hiatal hernia  Hypertension    Hyponatremia, acute on chronic 09/13/2021   PONV (postoperative nausea and vomiting)    Radiculopathy    Raynaud's disease    Sensorineural hearing loss (SNHL) of both ears    Tendonitis 11/2019   right wrist and right arm   Tinnitus    Past Surgical History:  Procedure Laterality Date   AXILLARY SURGERY Right 2008   BIOPSY  08/03/2023   Procedure: BIOPSY;  Surgeon: Lemar Lofty., MD;  Location: [facility];  Service:  Gastroenterology;;   CHOLECYSTECTOMY     COLONOSCOPY WITH PROPOFOL N/A 08/21/2023   Procedure: COLONOSCOPY WITH PROPOFOL;  Surgeon: [name], MD;  Location: [facility];  Service: Endoscopy;  Laterality: N/A;  poor prep   CYSTOCELE REPAIR N/A 02/08/2016   Procedure: ANTERIOR REPAIR (CYSTOCELE);  Surgeon: Suzy Bouchard, MD;  Location: [facility];  Service: Gynecology;  Laterality: N/A;   ESOPHAGOGASTRODUODENOSCOPY N/A 08/03/2023   Procedure: ESOPHAGOGASTRODUODENOSCOPY (EGD);  Surgeon: Lemar Lofty., MD;  Location: [facility];  Service: Gastroenterology;  Laterality: N/A;   EUS N/A 08/03/2023   Procedure: UPPER ENDOSCOPIC ULTRASOUND (EUS) RADIAL;  Surgeon: Lemar Lofty., MD;  Location: [facility];  Service: Gastroenterology;  Laterality: N/A;   LAPAROSCOPIC GASTRIC SLEEVE RESECTION WITH HIATAL HERNIA REPAIR  2015   TONSILLECTOMY     TUBAL LIGATION     VAGINAL HYSTERECTOMY N/A 02/08/2016   Procedure: HYSTERECTOMY VAGINAL WITH BSO;  Surgeon: Suzy Bouchard, MD;  Location: [facility];  Service: Gynecology;  Laterality: N/A;   VARICOSE VEIN SURGERY     Family History  Problem Relation Age of Onset   Stroke Mother    Cancer Father    Breast cancer Neg Hx    Social History   Socioeconomic History   Marital status: Divorced    Spouse name: N/A   Number of children: 2   Years of education: 12   Highest education level: Some college, no degree  Occupational History   Occupation: retired  Tobacco Use   Smoking status: Every Day    Current packs/day: 0.10    Average packs/day: 0.1 packs/day for 56.9 years (5.7 ttl pk-yrs)    Types: Cigarettes    Start date: 1968   Smokeless tobacco: Never   Tobacco comments:    2-3 daily  Vaping Use   Vaping status: Never Used  Substance and Sexual Activity   Alcohol use: Not Currently    Alcohol/week: 0.0 standard drinks of alcohol   Drug use: No   Sexual activity: Yes  Other Topics Concern   Not on  file  Social History Narrative   Not on file   Social Determinants of Health   Financial Resource Strain: Low Risk  (08/24/2023)   Overall Financial Resource Strain (CARDIA)    Difficulty of Paying Living Expenses: Not hard at all  Food Insecurity: No Food Insecurity (08/24/2023)   Hunger Vital Sign    Worried About Running Out of Food in the Last Year: Never true    Ran Out of Food in the Last Year: Never true  Transportation Needs: No Transportation Needs (08/24/2023)   PRAPARE - Administrator, Civil Service (Medical): No    Lack of Transportation (Non-Medical): No  Physical Activity: Inactive (08/24/2023)   Exercise Vital Sign    Days of Exercise per Week: 0 days    Minutes of Exercise per Session: 0 min  Stress: No Stress Concern Present (08/24/2023)   [employer] of Occupational Health -  Occupational Stress Questionnaire    Feeling of Stress : Not at all  Social Connections: Socially Isolated (08/24/2023)   Social Connection and Isolation Panel [NHANES]    Frequency of Communication with Friends and Family: More than three times a week    Frequency of Social Gatherings with Friends and Family: More than three times a week    Attends Religious Services: Never    Database administrator or Organizations: No    Attends Engineer, structural: Never    Marital Status: Divorced    Tobacco Counseling Ready to quit: Not Answered Counseling given: Not Answered Tobacco comments: 2-3 daily   Clinical Intake:  Pre-visit preparation completed: Yes  Pain : No/denies pain     BMI - recorded: 30.9 Nutritional Status: BMI > 30  Obese Nutritional Risks: Nausea/ vomitting/ diarrhea Diabetes: No  How often do you need to have someone help you when you read instructions, pamphlets, or other written materials from your doctor or pharmacy?: 1 - Never  Interpreter Needed?: No  Information entered by :: Tora Kindred, CMA   Activities of Daily  Living    08/24/2023    3:55 PM 08/21/2023    8:16 AM  In your present state of health, do you have any difficulty performing the following activities:  Hearing? 0 0  Vision? 0 0  Difficulty concentrating or making decisions? 0 0  Walking or climbing stairs? 1   Comment cane   Dressing or bathing? 0   Doing errands, shopping? 0   Preparing Food and eating ? N   Using the Toilet? N   In the past six months, have you accidently leaked urine? N   Do you have problems with loss of bowel control? N   Managing your Medications? N   Managing your Finances? N   Housekeeping or managing your Housekeeping? N     Patient Care Team: Reubin Milan, MD as PCP - General (Internal Medicine) Merri Ray, MD as Referring Physician (Physical Medicine and Rehabilitation) Linus Salmons, MD (Otolaryngology) Sherlean Foot, MD (Psychiatry)  Indicate any recent Medical Services you may have received from other than [facility] providers in the past year (date may be approximate).     Assessment:   This is a routine wellness examination for [place].  Hearing/Vision screen Hearing Screening - Comments:: Denies hearing loss Vision Screening - Comments:: Gets eye exams   Goals Addressed               This Visit's Progress     Exercise 3x per week (30 min per time) (pt-stated)        Depression Screen    08/24/2023    3:53 PM 09/13/2022    2:42 PM 08/03/2022   11:03 AM 09/23/2021   10:45 AM 06/12/2021    9:20 AM 12/17/2020    8:38 AM 08/25/2020    9:43 AM  PHQ 2/9 Scores  PHQ - 2 Score 0 0 0 0 0 0 2  PHQ- 9 Score 0 0 0 0 0 3 8    Fall Risk    08/24/2023    3:58 PM 09/13/2022    2:42 PM 08/03/2022   11:17 AM 09/23/2021   10:46 AM 06/12/2021    9:29 AM  Fall Risk   Falls in the past year? 0 1 1 1  0  Number falls in past yr: 0 1 1 1  0  Injury with Fall? 0 0 0 0 0  Risk for fall due  to : No Fall Risks History of fall(s) History of fall(s) No Fall Risks No Fall Risks  Follow up  Falls prevention discussed Falls evaluation completed Falls evaluation completed Falls evaluation completed Falls evaluation completed    MEDICARE RISK AT HOME: Medicare Risk at Home Any stairs in or around the home?: Yes If so, are there any without handrails?: No Home free of loose throw rugs in walkways, pet beds, electrical cords, etc?: Yes Adequate lighting in your home to reduce risk of falls?: Yes Life alert?: No Use of a cane, walker or w/c?: Yes (cane) Grab bars in the bathroom?: No Shower chair or bench in shower?: No Elevated toilet seat or a handicapped toilet?: No  TIMED UP AND GO:  Was the test performed?  No    Cognitive Function:        08/24/2023    3:59 PM 08/03/2022   11:17 AM 06/12/2021    9:40 AM  6CIT Screen  What Year? 0 points 0 points 0 points  What month? 0 points 0 points 0 points  What time? 0 points 0 points 0 points  Count back from 20 0 points 0 points 0 points  Months in reverse 0 points 0 points 0 points  Repeat phrase 0 points 0 points 0 points  Total Score 0 points 0 points 0 points    Immunizations Immunization History  Administered Date(s) Administered   Influenza, High Dose Seasonal PF 10/27/2017   Influenza-Unspecified 09/08/2015, 10/22/2016   Moderna Sars-Covid-2 Vaccination 12/28/2019, 01/25/2020, 09/15/2020   PNEUMOCOCCAL CONJUGATE-20 09/13/2022   Pneumococcal Conjugate-13 10/27/2017   Tdap 07/16/2014   Unspecified SARS-COV-2 Vaccination 09/01/2022    TDAP status: Up to date  Flu Vaccine status: Due, Education has been provided regarding the importance of this vaccine. Advised may receive this vaccine at local pharmacy or Health Dept. Aware to provide a copy of the vaccination record if obtained from local pharmacy or Health Dept. Verbalized acceptance and understanding.  Pneumococcal vaccine status: Up to date  Covid-19 vaccine status: Information provided on how to obtain vaccines.   Qualifies for Shingles Vaccine? Yes    Zostavax completed No   Shingrix Completed?: No.    Education has been provided regarding the importance of this vaccine. Patient has been advised to call insurance company to determine out of pocket expense if they have not yet received this vaccine. Advised may also receive vaccine at local pharmacy or Health Dept. Verbalized acceptance and understanding.  Screening Tests Health Maintenance  Topic Date Due   Zoster Vaccines- Shingrix (1 of 2) Never done   DEXA SCAN  Never done   MAMMOGRAM  06/16/2017   Fecal DNA (Cologuard)  12/11/2022   INFLUENZA VACCINE  05/04/2023   COVID-19 Vaccine (5 - 2023-24 season) 06/04/2023   DTaP/Tdap/Td (2 - Td or Tdap) 07/16/2024   Medicare Annual Wellness (AWV)  08/23/2024   Pneumonia Vaccine 66+ Years old  Completed   Hepatitis C Screening  Completed   HPV VACCINES  Aged Out   Colonoscopy  Discontinued    Health Maintenance  Health Maintenance Due  Topic Date Due   Zoster Vaccines- Shingrix (1 of 2) Never done   DEXA SCAN  Never done   MAMMOGRAM  06/16/2017   Fecal DNA (Cologuard)  12/11/2022   INFLUENZA VACCINE  05/04/2023   COVID-19 Vaccine (5 - 2023-24 season) 06/04/2023    Colorectal screening status: Colonoscopy done 08/21/23. Needs repeat due to poor prep.  Mammogram status: Ordered 08/24/23. Pt provided  with contact info and advised to call to schedule appt.   Bone Density status: Ordered 08/24/23. Pt provided with contact info and advised to call to schedule appt.  Lung Cancer Screening: (Low Dose CT Chest recommended if Age 72-80 years, 20 pack-year currently smoking OR have quit w/in 15years.) does not qualify.   Lung Cancer Screening Referral: n/a  Additional Screening:  Hepatitis C Screening: does not qualify; Completed 09/13/22  Vision Screening: Recommended annual ophthalmology exams for early detection of glaucoma and other disorders of the eye.  Dental Screening: Recommended annual dental exams for proper oral  hygiene   Community Resource Referral / Chronic Care Management: CRR required this visit?  No   CCM required this visit?  No     Plan:     I have personally reviewed and noted the following in the patient's chart:   Medical and social history Use of alcohol, tobacco or illicit drugs  Current medications and supplements including opioid prescriptions. Patient is currently taking opioid prescriptions. Information provided to patient regarding non-opioid alternatives. Patient advised to discuss non-opioid treatment plan with their provider. Functional ability and status Nutritional status Physical activity Advanced directives List of other physicians Hospitalizations, surgeries, and ER visits in previous 12 months Vitals Screenings to include cognitive, depression, and falls Referrals and appointments  In addition, I have reviewed and discussed with patient certain preventive protocols, quality metrics, and best practice recommendations. A written personalized care plan for preventive services as well as general preventive health recommendations were provided to patient.     Tora Kindred, CMA   08/24/2023   After Visit Summary: (MyChart) Due to this being a telephonic visit, the after visit summary with patients personalized plan was offered to patient via MyChart   Nurse Notes:  Needs appointment, last seen 11/23/22 Needs flu and covid vaccines Will  consider shingles vaccines Placed order for a MMG and DEXA scan Need repeat colonoscopy due to poor prep from 08/21/23 procedure

## 2023-08-24 NOTE — Patient Instructions (Addendum)
Ms. Lori Trevino , Thank you for taking time to come for your Medicare Wellness Visit. I appreciate your ongoing commitment to your health goals. Please review the following plan we discussed and let me know if I can assist you in the future.   Referrals/Orders/Follow-Ups/Clinician Recommendations:  Get the flu and covid vaccines at your earliest convenience. Consider getting the shingles vaccine. I have placed an order for a mammogram and bone density test. Call [phone] to schedule at your earliest convenience. Tell them you would like to have both tests done at the same visit. You need to schedule an appointment with Dr. Judithann Graves. You last saw her 11/23/22.  This is a list of the screening recommended for you and due dates:  Health Maintenance  Topic Date Due   Zoster (Shingles) Vaccine (1 of 2) Never done   DEXA scan (bone density measurement)  Never done   Mammogram  06/16/2017   Cologuard (Stool DNA test)  12/11/2022   COVID-19 Vaccine (5 - 2023-24 season) 06/04/2023   Flu Shot  01/01/2024*   DTaP/Tdap/Td vaccine (2 - Td or Tdap) 07/16/2024   Medicare Annual Wellness Visit  08/23/2024   Pneumonia Vaccine  Completed   Hepatitis C Screening  Completed   HPV Vaccine  Aged Out   Colon Cancer Screening  Discontinued  *Topic was postponed. The date shown is not the original due date.    Advanced directives: (Provided) Advance directive discussed with you today. I have provided a copy for you to complete at home and have notarized. Once this is complete, please bring a copy in to our office so we can scan it into your chart.   Next Medicare Annual Wellness Visit scheduled for next year: Yes, 09/12/24 @ 10:40am  Managing Pain Without Opioids Opioids are strong medicines used to treat moderate to severe pain. For some people, especially those who have long-term (chronic) pain, opioids may not be the best choice for pain management due to: Side effects like nausea, constipation, and  sleepiness. The risk of addiction (opioid use disorder). The longer you take opioids, the greater your risk of addiction. Pain that lasts for more than 3 months is called chronic pain. Managing chronic pain usually requires more than one approach and is often provided by a team of health care providers working together (multidisciplinary approach). Pain management may be done at a pain management center or pain clinic. How to manage pain without the use of opioids Use non-opioid medicines Non-opioid medicines for pain may include: Over-the-counter or prescription non-steroidal anti-inflammatory drugs (NSAIDs). These may be the first medicines used for pain. They work well for muscle and bone pain, and they reduce swelling. Acetaminophen. This over-the-counter medicine may work well for milder pain but not swelling. Antidepressants. These may be used to treat chronic pain. A certain type of antidepressant (tricyclics) is often used. These medicines are given in lower doses for pain than when used for depression. Anticonvulsants. These are usually used to treat seizures but may also reduce nerve (neuropathic) pain. Muscle relaxants. These relieve pain caused by sudden muscle tightening (spasms). You may also use a pain medicine that is applied to the skin as a patch, cream, or gel (topical analgesic), such as a numbing medicine. These may cause fewer side effects than medicines taken by mouth. Do certain therapies as directed Some therapies can help with pain management. They include: Physical therapy. You will do exercises to gain strength and flexibility. A physical therapist may teach you exercises to move  and stretch parts of your body that are weak, stiff, or painful. You can learn these exercises at physical therapy visits and practice them at home. Physical therapy may also involve: Massage. Heat wraps or applying heat or cold to affected areas. Electrical signals that interrupt pain signals  (transcutaneous electrical nerve stimulation, TENS). Weak lasers that reduce pain and swelling (low-level laser therapy). Signals from your body that help you learn to regulate pain (biofeedback). Occupational therapy. This helps you to learn ways to function at home and work with less pain. Recreational therapy. This involves trying new activities or hobbies, such as a physical activity or drawing. Mental health therapy, including: Cognitive behavioral therapy (CBT). This helps you learn coping skills for dealing with pain. Acceptance and commitment therapy (ACT) to change the way you think and react to pain. Relaxation therapies, including muscle relaxation exercises and mindfulness-based stress reduction. Pain management counseling. This may be individual, family, or group counseling.  Receive medical treatments Medical treatments for pain management include: Nerve block injections. These may include a pain blocker and anti-inflammatory medicines. You may have injections: Near the spine to relieve chronic back or neck pain. Into joints to relieve back or joint pain. Into nerve areas that supply a painful area to relieve body pain. Into muscles (trigger point injections) to relieve some painful muscle conditions. A medical device placed near your spine to help block pain signals and relieve nerve pain or chronic back pain (spinal cord stimulation device). Acupuncture. Follow these instructions at home Medicines Take over-the-counter and prescription medicines only as told by your health care provider. If you are taking pain medicine, ask your health care providers about possible side effects to watch out for. Do not drive or use heavy machinery while taking prescription opioid pain medicine. Lifestyle  Do not use drugs or alcohol to reduce pain. If you drink alcohol, limit how much you have to: 0-1 drink a day for women who are not pregnant. 0-2 drinks a day for men. Know how much  alcohol is in a drink. In the [place] one drink equals one 12 oz bottle of beer (355 mL), one 5 oz glass of wine (148 mL), or one 1 oz glass of hard liquor (44 mL). Do not use any products that contain nicotine or tobacco. These products include cigarettes, chewing tobacco, and vaping devices, such as e-cigarettes. If you need help quitting, ask your health care provider. Eat a healthy diet and maintain a healthy weight. Poor diet and excess weight may make pain worse. Eat foods that are high in fiber. These include fresh fruits and vegetables, whole grains, and beans. Limit foods that are high in fat and processed sugars, such as fried and sweet foods. Exercise regularly. Exercise lowers stress and may help relieve pain. Ask your health care provider what activities and exercises are safe for you. If your health care provider approves, join an exercise class that combines movement and stress reduction. Examples include yoga and tai chi. Get enough sleep. Lack of sleep may make pain worse. Lower stress as much as possible. Practice stress reduction techniques as told by your therapist. General instructions Work with all your pain management providers to find the treatments that work best for you. You are an important member of your pain management team. There are many things you can do to reduce pain on your own. Consider joining an online or in-person support group for people who have chronic pain. Keep all follow-up visits. This is important. Where  to find more information You can find more information about managing pain without opioids from: American Academy of Pain Medicine: painmed.org Institute for Chronic Pain: instituteforchronicpain.org American Chronic Pain Association: theacpa.org Contact a health care provider if: You have side effects from pain medicine. Your pain gets worse or does not get better with treatments or home therapy. You are struggling with anxiety or  depression. Summary Many types of pain can be managed without opioids. Chronic pain may respond better to pain management without opioids. Pain is best managed when you and a team of health care providers work together. Pain management without opioids may include non-opioid medicines, medical treatments, physical therapy, mental health therapy, and lifestyle changes. Tell your health care providers if your pain gets worse or is not being managed well enough. This information is not intended to replace advice given to you by your health care provider. Make sure you discuss any questions you have with your health care provider. Document Revised: 12/30/2020 Document Reviewed: 12/30/2020 Elsevier Patient Education  2024 [employer].   Fall Prevention in the Home, Adult Falls can cause injuries and affect people of all ages. There are many simple things that you can do to make your home safe and to help prevent falls. If you need it, ask for help making these changes. What actions can I take to prevent falls? General information Use good lighting in all rooms. Make sure to: Replace any light bulbs that burn out. Turn on lights if it is dark and use night-lights. Keep items that you use often in easy-to-reach places. Lower the shelves around your home if needed. Move furniture so that there are clear paths around it. Do not keep throw rugs or other things on the floor that can make you trip. If any of your floors are uneven, fix them. Add color or contrast paint or tape to clearly mark and help you see: Grab bars or handrails. First and last steps of staircases. Where the edge of each step is. If you use a ladder or stepladder: Make sure that it is fully opened. Do not climb a closed ladder. Make sure the sides of the ladder are locked in place. Have someone hold the ladder while you use it. Know where your pets are as you move through your home. What can I do in the bathroom?      Keep the floor dry. Clean up any water that is on the floor right away. Remove soap buildup in the bathtub or shower. Buildup makes bathtubs and showers slippery. Use non-skid mats or decals on the floor of the bathtub or shower. Attach bath mats securely with double-sided, non-slip rug tape. If you need to sit down while you are in the shower, use a non-slip stool. Install grab bars by the toilet and in the bathtub and shower. Do not use towel bars as grab bars. What can I do in the bedroom? Make sure that you have a light by your bed that is easy to reach. Do not use any sheets or blankets on your bed that hang to the floor. Have a firm bench or chair with side arms that you can use for support when you get dressed. What can I do in the kitchen? Clean up any spills right away. If you need to reach something above you, use a sturdy step stool that has a grab bar. Keep electrical cables out of the way. Do not use floor polish or wax that makes floors slippery. What  can I do with my stairs? Do not leave anything on the stairs. Make sure that you have a light switch at the top and the bottom of the stairs. Have them installed if you do not have them. Make sure that there are handrails on both sides of the stairs. Fix handrails that are broken or loose. Make sure that handrails are as long as the staircases. Install non-slip stair treads on all stairs in your home if they do not have carpet. Avoid having throw rugs at the top or bottom of stairs, or secure the rugs with carpet tape to prevent them from moving. Choose a carpet design that does not hide the edge of steps on the stairs. Make sure that carpet is firmly attached to the stairs. Fix any carpet that is loose or worn. What can I do on the outside of my home? Use bright outdoor lighting. Repair the edges of walkways and driveways and fix any cracks. Clear paths of anything that can make you trip, such as tools or rocks. Add color or  contrast paint or tape to clearly mark and help you see high doorway thresholds. Trim any bushes or trees on the main path into your home. Check that handrails are securely fastened and in good repair. Both sides of all steps should have handrails. Install guardrails along the edges of any raised decks or porches. Have leaves, snow, and ice cleared regularly. Use sand, salt, or ice melt on walkways during winter months if you live where there is ice and snow. In the garage, clean up any spills right away, including grease or oil spills. What other actions can I take? Review your medicines with your health care provider. Some medicines can make you confused or feel dizzy. This can increase your chance of falling. Wear closed-toe shoes that fit well and support your feet. Wear shoes that have rubber soles and low heels. Use a cane, walker, scooter, or crutches that help you move around if needed. Talk with your provider about other ways that you can decrease your risk of falls. This may include seeing a physical therapist to learn to do exercises to improve movement and strength. Where to find more information Centers for Disease Control and Prevention, [employer]: TonerPromos.no [employer] on Aging: BaseRingTones.pl National Institute on Aging: BaseRingTones.pl Contact a health care provider if: You are afraid of falling at home. You feel weak, drowsy, or dizzy at home. You fall at home. Get help right away if you: Lose consciousness or have trouble moving after a fall. Have a fall that causes a head injury. These symptoms may be an emergency. Get help right away. Call 911. Do not wait to see if the symptoms will go away. Do not drive yourself to the hospital. This information is not intended to replace advice given to you by your health care provider. Make sure you discuss any questions you have with your health care provider. Document Revised: 05/23/2022 Document Reviewed: 05/23/2022 Elsevier Patient  Education  2024 [employer].

## 2023-08-30 ENCOUNTER — Other Ambulatory Visit: Payer: Self-pay | Admitting: Internal Medicine

## 2023-08-30 DIAGNOSIS — I10 Essential (primary) hypertension: Secondary | ICD-10-CM

## 2023-08-30 NOTE — Telephone Encounter (Signed)
Requested medication (s) are due for refill today: yes  Requested medication (s) are on the active medication list: yes  Last refill:  05/25/23 #90  Future visit scheduled: no  Notes to clinic:  called pt and LM on VM to call back to make appt    Requested Prescriptions  Pending Prescriptions Disp Refills   olmesartan (BENICAR) 20 MG tablet [Pharmacy Med Name: OLMESARTAN MEDOXOMIL 20 MG TAB] 90 tablet 0    Sig: TAKE 1 TABLET BY MOUTH EVERY DAY     Cardiovascular:  Angiotensin Receptor Blockers Passed - 08/30/2023  1:31 AM      Passed - Cr in normal range and within 180 days    Creatinine, Ser  Date Value Ref Range Status  04/05/2023 0.64 0.57 - 1.00 mg/dL Final         Passed - K in normal range and within 180 days    Potassium  Date Value Ref Range Status  04/05/2023 5.0 3.5 - 5.2 mmol/L Final         Passed - Patient is not pregnant      Passed - Last BP in normal range    BP Readings from Last 1 Encounters:  08/21/23 119/73         Passed - Valid encounter within last 6 months    Recent Outpatient Visits           9 months ago 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   [place] [facility] at [facility], Nyoka Cowden, MD   11 months ago Essential hypertension   [place] [facility] at [facility], Nyoka Cowden, MD   1 year ago Essential hypertension   [place] [facility] at [facility], Nyoka Cowden, MD   2 years ago Raynaud'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   [place] [facility] at [facility], Nyoka Cowden, MD   3 years ago Essential hypertension   [facility] at [facility], Nyoka Cowden, MD

## 2023-10-02 ENCOUNTER — Telehealth: Payer: Self-pay | Admitting: Physician Assistant

## 2023-10-02 NOTE — Telephone Encounter (Signed)
The patient called in and left a voicemail to requesting to reschedule her appointment on 10/05/23. I called the patient back to let her know we got her message and no answer I left a voicemail for her to call us back. I inform her that her appointment is on 10/19/23.

## 2023-10-05 ENCOUNTER — Other Ambulatory Visit: Payer: Self-pay | Admitting: Internal Medicine

## 2023-10-05 DIAGNOSIS — I10 Essential (primary) hypertension: Secondary | ICD-10-CM

## 2023-10-09 ENCOUNTER — Telehealth: Payer: Medicare Other | Admitting: Internal Medicine

## 2023-10-09 NOTE — Telephone Encounter (Signed)
 Requested medication (s) are due for refill today: yes  Requested medication (s) are on the active medication list: yes  Last refill:  08/30/23 30/0  Future visit scheduled: no  Notes to clinic:  Unable to refill per protocol, appointment needed. Pt called, LVMTCB to schedule      Requested Prescriptions  Pending Prescriptions Disp Refills   olmesartan  (BENICAR ) 20 MG tablet [Pharmacy Med Name: OLMESARTAN  MEDOXOMIL 20 MG TAB] 90 tablet 1    Sig: TAKE 1 TABLET BY MOUTH EVERY DAY     Cardiovascular:  Angiotensin Receptor Blockers Failed - 10/09/2023 11:19 AM      Failed - Cr in normal range and within 180 days    Creatinine, Ser  Date Value Ref Range Status  04/05/2023 0.64 0.57 - 1.00 mg/dL Final         Failed - K in normal range and within 180 days    Potassium  Date Value Ref Range Status  04/05/2023 5.0 3.5 - 5.2 mmol/L Final         Passed - Patient is not pregnant      Passed - Last BP in normal range    BP Readings from Last 1 Encounters:  08/21/23 119/73         Passed - Valid encounter within last 6 months    Recent Outpatient Visits           10 months ago 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   [place] [facility] at [facility], Leita DEL, MD   1 year ago Essential hypertension   [place] [facility] at [facility], Leita DEL, MD   2 years ago Essential hypertension   [place] [facility] at [facility], Leita DEL, MD   2 years ago Raynaud'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   [place] [facility] at [facility], Leita DEL, MD   3 years ago Essential hypertension   [facility] [facility] at [facility], Leita DEL, MD

## 2023-10-09 NOTE — Telephone Encounter (Signed)
 Patient called, left VM to return the call to the office to scheduled an appt for medication refill request.

## 2023-10-11 ENCOUNTER — Telehealth: Payer: Medicare Other | Admitting: Internal Medicine

## 2023-10-19 ENCOUNTER — Ambulatory Visit: Payer: Medicare Other | Admitting: Physician Assistant

## 2023-10-19 NOTE — Progress Notes (Deleted)
Celso Amy, PA-C [address]  [address]  [place], [place] [address]  Main: [phone]  Fax: [phone]   Primary Care Physician: Reubin Milan, MD  Primary Gastroenterologist:  Celso Amy, PA-C / Dr. [name]    CC:  F/U IDA Anemia, Abnormal CT Colon, Constipation, Dilated Bile Duct  HPI: Lori Trevino is a 73 y.o. female returns for 63-month follow-up of chronic anemia, chronic constipation, dilated common bile duct (19 mm), atrophic pancreas, abnormal CT showing thickening of the ascending colon.  Previous sleeve gastrectomy.  She was started on MiraLAX and Colace stool softener.  Labs 04/2023: Low lipase 10, hemoglobin 10.5/MCV 92 (stable), normal CMP, normal LFTs, total iron 36, slightly low iron saturation 10, ferritin 18.  Normal B12 and folate.  She was started on OTC iron tablet once daily.  Abdominal MRI/MRCP was ordered, but patient did not complete.  08/03/2023 EGD by Dr. Meridee Score: LA grade a esophagitis.  3 islands of salmon-colored mucosa.  Evidence of sleeve gastrectomy.  Moderate gastritis with erosions.  3 small gastric ulcers.  Gastric biopsies negative for H. pylori, duodenal biopsies negative for celiac, esophageal biopsies negative for Barrett's.  She was darted on Protonix 40 Mg twice daily.  Repeat EGD in 4 months to ensure healing of gastric ulcers/gastritis.  08/03/2023 EUS by Dr. Meridee Score:  - There was dilation in the common bile duct and in the common hepatic duct. - Pancreatic parenchymal abnormalities consisting of atrophy and lobularity were noted in the entire pancreas. - Main pancreatic duct ( MPD) diameter was measured. Endosonographically, the MPD had a normal appearance with slight prominence in the tail of the pancreas. - No malignant- appearing lymph nodes were visualized in the celiac region ( level 20) , peripancreatic region and porta hepatis region.  08/21/2023 colonoscopy by Dr. Servando Snare: Poor prep.  Stool in the rectosigmoid  colon and descending colon.  No specimens collected.  Prep was inadequate to identify polyps.  Repeat colonoscopy with 2-day prep in a few months.  Alcohol??? Constipaiton??? NSAIDs???  Current Outpatient Medications  Medication Sig Dispense Refill   albuterol (VENTOLIN HFA) 108 (90 Base) MCG/ACT inhaler TAKE 2 PUFFS BY MOUTH EVERY 6 HOURS AS NEEDED FOR WHEEZE OR SHORTNESS OF BREATH (Patient not taking: Reported on 06/26/2023) 8.5 each 1   B Complex-Biotin-FA (B-COMPLEX PO) Take 1 tablet by mouth daily. (Patient not taking: Reported on 08/24/2023)     cyclobenzaprine (FLEXERIL) 10 MG tablet TAKE 1/2-1 TABLET BY MOUTH 3 TIMES A DAY AS NEEDED     fluticasone (FLONASE) 50 MCG/ACT nasal spray PLACE 1 SPRAY INTO BOTH NOSTRILS DAILY. 16 mL 2   loratadine (CLARITIN) 10 MG tablet Take 10 mg by mouth daily as needed for allergies. (Patient not taking: Reported on 08/24/2023)     methadone (DOLOPHINE) 1 mg/ml oral solution Take by mouth daily. 100 mg     Multiple Vitamin (ONE-A-DAY 55 PLUS PO) Take 1 tablet by mouth daily. (Patient not taking: Reported on 08/24/2023)     olmesartan (BENICAR) 20 MG tablet TAKE 1 TABLET BY MOUTH EVERY DAY 30 tablet 0   ondansetron (ZOFRAN) 8 MG tablet Take 1 tablet (8 mg total) by mouth every 8 (eight) hours as needed for nausea or vomiting. (Patient not taking: Reported on 08/24/2023) 20 tablet 0   ondansetron (ZOFRAN-ODT) 8 MG disintegrating tablet Take 1 tablet (8 mg total) by mouth every 8 (eight) hours as needed for nausea or vomiting. (Patient not taking: Reported on 08/24/2023) 20  tablet 0   pantoprazole (PROTONIX) 40 MG tablet Take 1 tablet (40 mg total) by mouth 2 (two) times daily before a meal. 60 tablet 12   No current facility-administered medications for this visit.    Allergies as of 10/20/2023 - Review Complete 08/24/2023  Allergen Reaction Noted   Neurontin [gabapentin] Swelling 06/30/2017   Zolpidem Other (See Comments) 06/12/2014    Past Medical  History:  Diagnosis Date   Allergy    Chronic pain    COPD (chronic obstructive pulmonary disease) (HCC)    Depression with anxiety    History of repair of hiatal hernia    Hypertension    Hyponatremia, acute on chronic 09/13/2021   PONV (postoperative nausea and vomiting)    Radiculopathy    Raynaud's disease    Sensorineural hearing loss (SNHL) of both ears    Tendonitis 11/2019   right wrist and right arm   Tinnitus     Past Surgical History:  Procedure Laterality Date   AXILLARY SURGERY Right 2008   BIOPSY  08/03/2023   Procedure: BIOPSY;  Surgeon: Lemar Lofty., MD;  Location: [facility];  Service: Gastroenterology;;   CHOLECYSTECTOMY     COLONOSCOPY WITH PROPOFOL N/A 08/21/2023   Procedure: COLONOSCOPY WITH PROPOFOL;  Surgeon: [name], MD;  [facility]: [facility];  Service: Endoscopy;  Laterality: N/A;  poor prep   CYSTOCELE REPAIR N/A 02/08/2016   Procedure: ANTERIOR REPAIR (CYSTOCELE);  Surgeon: Suzy Bouchard, MD;  Location: [facility];  Service: Gynecology;  Laterality: N/A;   ESOPHAGOGASTRODUODENOSCOPY N/A 08/03/2023   Procedure: ESOPHAGOGASTRODUODENOSCOPY (EGD);  Surgeon: Lemar Lofty., MD;  Location: [facility];  Service: Gastroenterology;  Laterality: N/A;   EUS N/A 08/03/2023   Procedure: UPPER ENDOSCOPIC ULTRASOUND (EUS) RADIAL;  Surgeon: Lemar Lofty., MD;  Location: [facility];  Service: Gastroenterology;  Laterality: N/A;   LAPAROSCOPIC GASTRIC SLEEVE RESECTION WITH HIATAL HERNIA REPAIR  2015   TONSILLECTOMY     TUBAL LIGATION     VAGINAL HYSTERECTOMY N/A 02/08/2016   Procedure: HYSTERECTOMY VAGINAL WITH BSO;  Surgeon: Suzy Bouchard, MD;  Location: [facility];  Service: Gynecology;  Laterality: N/A;   VARICOSE VEIN SURGERY      Review of Systems:    All systems reviewed and negative except where noted in HPI.   Physical Examination:   There were no vitals taken for this visit.  General:  Well-nourished, well-developed in no acute distress.  Lungs: Clear to auscultation bilaterally. Non-labored. Heart: Regular rate and rhythm, no murmurs rubs or gallops.  Abdomen: Bowel sounds are normal; Abdomen is Soft; No hepatosplenomegaly, masses or hernias;  No Abdominal Tenderness; No guarding or rebound tenderness. Neuro: Alert and oriented x 3.  Grossly intact.  Psych: Alert and cooperative, normal mood and affect.   Imaging Studies: No results found.  Assessment and Plan:   Lori Trevino is a 73 y.o. y/o female returns for follow-up of  1.  Chronic anemia /iron deficiency anemia  Previous colonoscopy had poor prep, unable to be completed.  Repeat labs: Labs: CBC, iron panel, ferritin, B12, folate, and celiac lab.   Continue or stop iron:  Schedule repeat colonoscopy with 2-day prep.   2.  Chronic constipation  Linzess  High-fiber diet and 64 ounces of water daily  3.  Dilated bile duct  Reassurance regarding recent EUS results from Dr. Irish Lack Roddy.  No evidence of bile duct stone or malignancy.  4.  Atrophic pancreas  Stop/avoid alcohol  5.  Erosive  gastritis (H. pylori negative)  Repeat EGD to verify healing of gastric ulcers on pantoprazole 40 Mg twice daily  Avoid NSAIDs and alcohol  6.  GERD with esophagitis  Continue PPI  Decrease pantoprazole to 40 mg once daily dose long-term  Recommend Lifestyle Modifications to prevent Acid Reflux.  Rec. Avoid coffee, sodas, peppermint, garlic, onions, alcohol, citrus fruits, chocolate, tomatoes, fatty and spicey foods.  Avoid eating 2-3 hours before bedtime.      Celso Amy, PA-C  Follow up ***  BP check ***

## 2023-10-20 ENCOUNTER — Ambulatory Visit: Payer: Medicare Other | Admitting: Physician Assistant

## 2023-10-20 ENCOUNTER — Ambulatory Visit: Payer: Medicare Other | Admitting: Internal Medicine

## 2023-12-01 ENCOUNTER — Other Ambulatory Visit: Payer: Self-pay | Admitting: Internal Medicine

## 2023-12-01 DIAGNOSIS — I10 Essential (primary) hypertension: Secondary | ICD-10-CM

## 2023-12-01 NOTE — Telephone Encounter (Signed)
 Copied from CRM [id number]. Topic: Clinical - Medication Refill >> Dec 01, 2023  8:02 AM Everette C wrote: Most Recent Primary Care Visit:  Provider: Tora Kindred  Department: [facility] [place]  Visit Type: MEDICARE AWV, SEQUENTIAL  Date: 08/24/2023  Medication: olmesartan (BENICAR) 20 MG tablet [[id number]]  Has the patient contacted their pharmacy? Yes (Agent: If no, request that the patient contact the pharmacy for the refill. If patient does not wish to contact the pharmacy document the reason why and proceed with request.) (Agent: If yes, when and what did the pharmacy advise?)  Is this the correct pharmacy for this prescription? Yes If no, delete pharmacy and type the correct one.  This is the patient's preferred pharmacy:  [id number] [place], [place] - [address] [place] [address] Phone: [phone] Fax: [phone]   Has the prescription been filled recently? No  Is the patient out of the medication? Yes  Has the patient been seen for an appointment in the last year OR does the patient have an upcoming appointment? Yes  Can we respond through MyChart? No  Agent: Please be advised that Rx refills may take up to 3 business days. We ask that you follow-up with your pharmacy.

## 2023-12-04 ENCOUNTER — Other Ambulatory Visit: Payer: Self-pay | Admitting: Internal Medicine

## 2023-12-04 NOTE — Telephone Encounter (Signed)
 Requested medications are due for refill today.  yes  Requested medications are on the active medications list.  yes  Last refill. 08/30/2023 #30 0 rf  Future visit scheduled.   no  Notes to clinic.  Pt is more than 3 months over due for an ov. Pt has cancelled or no showed several appts. Please advise.    Requested Prescriptions  Pending Prescriptions Disp Refills   olmesartan (BENICAR) 20 MG tablet 30 tablet 0    Sig: Take 1 tablet (20 mg total) by mouth daily.     Cardiovascular:  Angiotensin Receptor Blockers Failed - 12/04/2023  9:44 AM      Failed - Cr in normal range and within 180 days    Creatinine, Ser  Date Value Ref Range Status  04/05/2023 0.64 0.57 - 1.00 mg/dL Final         Failed - K in normal range and within 180 days    Potassium  Date Value Ref Range Status  04/05/2023 5.0 3.5 - 5.2 mmol/L Final         Passed - Patient is not pregnant      Passed - Last BP in normal range    BP Readings from Last 1 Encounters:  08/21/23 119/73         Passed - Valid encounter within last 6 months    Recent Outpatient Visits           1 year ago 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   [place] [facility] at [facility], Nyoka Cowden, MD   1 year ago Essential hypertension   [place] [facility] at [facility], Nyoka Cowden, MD   2 years ago Essential hypertension   [place] [facility] at [facility], Nyoka Cowden, MD   2 years ago Raynaud'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    Primary Care & Sports Medicine at [facility], Nyoka Cowden, MD   3 years ago Essential hypertension   [facility] at [facility], Nyoka Cowden, MD

## 2023-12-04 NOTE — Progress Notes (Unsigned)
 Date:  12/04/2023   Name:  Lori Trevino   DOB:  [birth year]   MRN:  [id number]   Chief Complaint: No chief complaint on file.  HPI  Review of Systems   Lab Results  Component Value Date   NA 134 04/05/2023   K 5.0 04/05/2023   CO2 26 04/05/2023   GLUCOSE 79 04/05/2023   BUN 8 04/05/2023   CREATININE 0.64 04/05/2023   CALCIUM 9.4 04/05/2023   EGFR 93 04/05/2023   GFRNONAA >60 03/27/2023   Lab Results  Component Value Date   CHOL 245 (H) 09/13/2022   HDL 116 09/13/2022   LDLCALC 116 (H) 09/13/2022   TRIG 78 09/13/2022   CHOLHDL 2.1 09/13/2022   Lab Results  Component Value Date   TSH 2.290 09/13/2022   No results found for: "HGBA1C" Lab Results  Component Value Date   WBC 4.6 04/05/2023   HGB 10.5 (L) 04/05/2023   HCT 32.2 (L) 04/05/2023   MCV 92 04/05/2023   PLT 269 03/27/2023   Lab Results  Component Value Date   ALT 13 04/05/2023   AST 21 04/05/2023   ALKPHOS 97 04/05/2023   BILITOT 0.3 04/05/2023   Lab Results  Component Value Date   VD25OH 32.01 09/13/2021     Patient Active Problem List   Diagnosis Date Noted   Dilated bile duct 08/03/2023   Gastric ulcer without hemorrhage or perforation 08/03/2023   Gastritis and gastroduodenitis 08/03/2023   Morbid obesity (HCC) 03/30/2023   Arthritis 03/30/2023   Snoring 03/30/2023   Lymphedema 11/28/2022   Gastroesophageal reflux disease 09/13/2022   Chronic venous insufficiency 07/31/2022   Raynaud'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 12/17/2020   Pedal edema 02/26/2020   Sensorineural hearing loss (SNHL) of both ears 09/17/2019   Essential hypertension 08/23/2019   Tinnitus of both ears 08/23/2019   Lump on finger 02/13/2018   Swelling of finger of left hand 02/09/2018   Gait instability 10/27/2017   Environmental and seasonal allergies 02/14/2017   Mixed hyperlipidemia 12/08/2015   Body mass index (BMI) greater than 25 06/09/2015   Radiculopathy of lumbar region 06/16/2014   COPD with chronic  bronchitis (HCC) 06/12/2014   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HCC) 06/12/2014   Chronic insomnia 06/12/2014   Vitamin D deficiency 05/31/2014    Allergies  Allergen Reactions   Neurontin [Gabapentin] Swelling   Zolpidem Other (See Comments)    sleepwalking     Past Surgical History:  Procedure Laterality Date   AXILLARY SURGERY Right 2008   BIOPSY  08/03/2023   Procedure: BIOPSY;  Surgeon: Lemar Lofty., MD;  Location: [facility];  Service: Gastroenterology;;   CHOLECYSTECTOMY     COLONOSCOPY WITH PROPOFOL N/A 08/21/2023   Procedure: COLONOSCOPY WITH PROPOFOL;  Surgeon: [name], MD;  [facility];  Service: Endoscopy;  Laterality: N/A;  poor prep   CYSTOCELE REPAIR N/A 02/08/2016   Procedure: ANTERIOR REPAIR (CYSTOCELE);  Surgeon: Suzy Bouchard, MD;  Location: [facility];  Service: Gynecology;  Laterality: N/A;   ESOPHAGOGASTRODUODENOSCOPY N/A 08/03/2023   Procedure: ESOPHAGOGASTRODUODENOSCOPY (EGD);  Surgeon: Lemar Lofty., MD;  Location: [facility];  Service: Gastroenterology;  Laterality: N/A;   EUS N/A 08/03/2023   Procedure: UPPER ENDOSCOPIC ULTRASOUND (EUS) RADIAL;  Surgeon: Lemar Lofty., MD;  Location: [facility];  Service: Gastroenterology;  Laterality: N/A;   LAPAROSCOPIC GASTRIC SLEEVE RESECTION WITH HIATAL HERNIA REPAIR  2015   TONSILLECTOMY     TUBAL LIGATION  VAGINAL HYSTERECTOMY N/A 02/08/2016   Procedure: HYSTERECTOMY VAGINAL WITH BSO;  Surgeon: Suzy Bouchard, MD;  Location: [facility];  Service: Gynecology;  Laterality: N/A;   VARICOSE VEIN SURGERY      Social History   Tobacco Use   Smoking status: Every Day    Current packs/day: 0.10    Average packs/day: 0.1 packs/day for 57.2 years (5.7 ttl pk-yrs)    Types: Cigarettes    Start date: 1968   Smokeless tobacco: Never   Tobacco comments:    2-3 daily  Vaping Use   Vaping status: Never Used  Substance Use  Topics   Alcohol use: Not Currently    Alcohol/week: 0.0 standard drinks of alcohol   Drug use: No     Medication list has been reviewed and updated.  No outpatient medications have been marked as taking for the 12/04/23 encounter (Orders Only) with Reubin Milan, MD.       09/13/2022    2:42 PM 09/23/2021   10:46 AM 06/12/2021    9:35 AM 12/17/2020    8:39 AM  GAD 7 : Generalized Anxiety Score  Nervous, Anxious, on Edge 0 0 0 1  Control/stop worrying 0 0 0 1  Worry too much - different things 0 0 0 1  Trouble relaxing 0 0 0 0  Restless 0 0 0 0  Easily annoyed or irritable 0 0 0 0  Afraid - awful might happen 0 0 0 0  Total GAD 7 Score 0 0 0 3  Anxiety Difficulty Not difficult at all Not difficult at all Not difficult at all        08/24/2023    3:53 PM 09/13/2022    2:42 PM 08/03/2022   11:03 AM  Depression screen PHQ 2/9  Decreased Interest 0 0 0  Down, Depressed, Hopeless 0 0 0  PHQ - 2 Score 0 0 0  Altered sleeping 0 0 0  Tired, decreased energy 0 0 0  Change in appetite 0 0 0  Feeling bad or failure about yourself  0 0 0  Trouble concentrating 0 0 0  Moving slowly or fidgety/restless 0 0 0  Suicidal thoughts 0 0 0  PHQ-9 Score 0 0 0  Difficult doing work/chores Not difficult at all Not difficult at all Not difficult at all    BP Readings from Last 3 Encounters:  08/21/23 119/73  08/03/23 (!) 158/83  04/05/23 135/88    Physical Exam  Wt Readings from Last 3 Encounters:  08/24/23 180 lb (81.6 kg)  08/21/23 188 lb 1.6 oz (85.3 kg)  08/03/23 190 lb 0.6 oz (86.2 kg)    There were no vitals taken for this visit.  Assessment and Plan:  Problem List Items Addressed This Visit   None   No follow-ups on file.    Reubin Milan, MD [facility] [place]

## 2023-12-04 NOTE — Telephone Encounter (Signed)
 Requested medication (s) are due for refill today: Yes  Requested medication (s) are on the active medication list: Yes  Last refill:  08/30/23, #30, 0 refills  Future visit scheduled: No  Notes to clinic:  Unable to refill per protocol, courtesy refill already given, routing for provider approval.      Requested Prescriptions  Pending Prescriptions Disp Refills   olmesartan (BENICAR) 20 MG tablet [Pharmacy Med Name: OLMESARTAN MEDOXOMIL 20 MG TAB] 30 tablet 0    Sig: TAKE 1 TABLET BY MOUTH EVERY DAY     Cardiovascular:  Angiotensin Receptor Blockers Failed - 12/04/2023  1:27 PM      Failed - Cr in normal range and within 180 days    Creatinine, Ser  Date Value Ref Range Status  04/05/2023 0.64 0.57 - 1.00 mg/dL Final         Failed - K in normal range and within 180 days    Potassium  Date Value Ref Range Status  04/05/2023 5.0 3.5 - 5.2 mmol/L Final         Passed - Patient is not pregnant      Passed - Last BP in normal range    BP Readings from Last 1 Encounters:  08/21/23 119/73         Passed - Valid encounter within last 6 months    Recent Outpatient Visits           1 year ago 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   [place] [facility] at [facility], Nyoka Cowden, MD   1 year ago Essential hypertension   [place] [facility] at [facility], Nyoka Cowden, MD   2 years ago Essential hypertension   [place] [facility] at [facility], Nyoka Cowden, MD   2 years ago Raynaud'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    Primary Care & Sports Medicine at [facility], Nyoka Cowden, MD   3 years ago Essential hypertension   [facility] Medicine at [facility], Nyoka Cowden, MD

## 2023-12-05 ENCOUNTER — Ambulatory Visit: Admitting: Internal Medicine

## 2023-12-21 ENCOUNTER — Telehealth: Payer: Self-pay

## 2023-12-21 ENCOUNTER — Encounter: Payer: Self-pay | Admitting: Internal Medicine

## 2023-12-21 ENCOUNTER — Ambulatory Visit (INDEPENDENT_AMBULATORY_CARE_PROVIDER_SITE_OTHER): Admitting: Internal Medicine

## 2023-12-21 VITALS — BP 150/80 | HR 102 | Ht 64.0 in | Wt 180.0 lb

## 2023-12-21 DIAGNOSIS — Z1211 Encounter for screening for malignant neoplasm of colon: Secondary | ICD-10-CM

## 2023-12-21 DIAGNOSIS — K2990 Gastroduodenitis, unspecified, without bleeding: Secondary | ICD-10-CM

## 2023-12-21 DIAGNOSIS — I10 Essential (primary) hypertension: Secondary | ICD-10-CM | POA: Diagnosis not present

## 2023-12-21 DIAGNOSIS — K2970 Gastritis, unspecified, without bleeding: Secondary | ICD-10-CM | POA: Diagnosis not present

## 2023-12-21 DIAGNOSIS — J4489 Other specified chronic obstructive pulmonary disease: Secondary | ICD-10-CM

## 2023-12-21 MED ORDER — OLMESARTAN MEDOXOMIL 20 MG PO TABS: 20.0000 mg | ORAL_TABLET | Freq: Every day | ORAL | 1 refills | Status: DC

## 2023-12-21 MED ORDER — ALBUTEROL SULFATE HFA 108 (90 BASE) MCG/ACT IN AERS: 2.0000 | INHALATION_SPRAY | Freq: Four times a day (QID) | RESPIRATORY_TRACT | 0 refills | Status: DC | PRN

## 2023-12-21 NOTE — Progress Notes (Signed)
 Date:  12/21/2023   Name:  Lori Trevino   DOB:  June 03, 1951   MRN:  [id number]   Chief Complaint: Hypertension  Hypertension This is a chronic problem. The problem is uncontrolled (ran out of meds 3 weeks ago). Pertinent negatives include no chest pain, headaches, palpitations, peripheral edema or shortness of breath. Past treatments include angiotensin blockers.    Review of Systems  Constitutional:  Negative for chills, fatigue and fever.  HENT:  Negative for trouble swallowing.   Eyes:  Negative for visual disturbance.  Respiratory:  Negative for shortness of breath.   Cardiovascular:  Negative for chest pain and palpitations.  Gastrointestinal:  Negative for blood in stool.  Musculoskeletal:  Positive for arthralgias, back pain and gait problem (uses cane).  Neurological:  Negative for dizziness and headaches.  Psychiatric/Behavioral:  Negative for dysphoric mood and sleep disturbance. The patient is not nervous/anxious.      Lab Results  Component Value Date   NA 134 04/05/2023   K 5.0 04/05/2023   CO2 26 04/05/2023   GLUCOSE 79 04/05/2023   BUN 8 04/05/2023   CREATININE 0.64 04/05/2023   CALCIUM 9.4 04/05/2023   EGFR 93 04/05/2023   GFRNONAA >60 03/27/2023   Lab Results  Component Value Date   CHOL 245 (H) 09/13/2022   HDL 116 09/13/2022   LDLCALC 116 (H) 09/13/2022   TRIG 78 09/13/2022   CHOLHDL 2.1 09/13/2022   Lab Results  Component Value Date   TSH 2.290 09/13/2022   No results found for: "HGBA1C" Lab Results  Component Value Date   WBC 4.6 04/05/2023   HGB 10.5 (L) 04/05/2023   HCT 32.2 (L) 04/05/2023   MCV 92 04/05/2023   PLT 269 03/27/2023   Lab Results  Component Value Date   ALT 13 04/05/2023   AST 21 04/05/2023   ALKPHOS 97 04/05/2023   BILITOT 0.3 04/05/2023   Lab Results  Component Value Date   VD25OH 32.01 09/13/2021     Patient Active Problem List   Diagnosis Date Noted   Dilated bile duct 08/03/2023   Gastric ulcer without  hemorrhage or perforation 08/03/2023   Gastritis and gastroduodenitis 08/03/2023   Morbid obesity (HCC) 03/30/2023   Arthritis 03/30/2023   Snoring 03/30/2023   Lymphedema 11/28/2022   Gastroesophageal reflux disease 09/13/2022   Chronic venous insufficiency 07/31/2022   Raynaud'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 12/17/2020   Pedal edema 02/26/2020   Sensorineural hearing loss (SNHL) of both ears 09/17/2019   Essential hypertension 08/23/2019   Tinnitus of both ears 08/23/2019   Lump on finger 02/13/2018   Swelling of finger of left hand 02/09/2018   Gait instability 10/27/2017   Environmental and seasonal allergies 02/14/2017   Mixed hyperlipidemia 12/08/2015   Body mass index (BMI) greater than 25 06/09/2015   Radiculopathy of lumbar region 06/16/2014   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HCC) 06/12/2014   Chronic insomnia 06/12/2014   Vitamin D deficiency 05/31/2014    Allergies  Allergen Reactions   Neurontin [Gabapentin] Swelling   Zolpidem Other (See Comments)    sleepwalking     Past Surgical History:  Procedure Laterality Date   AXILLARY SURGERY Right 2008   BIOPSY  08/03/2023   Procedure: BIOPSY;  Surgeon: Lemar Lofty., MD;  Location: [facility];  Service: Gastroenterology;;   CHOLECYSTECTOMY     COLONOSCOPY WITH PROPOFOL N/A 08/21/2023   Procedure: COLONOSCOPY WITH PROPOFOL;  Surgeon: Servando Snare,  Darren, MD;  Location: [facility];  Service: Endoscopy;  Laterality: N/A;  poor prep   CYSTOCELE REPAIR N/A 02/08/2016   Procedure: ANTERIOR REPAIR (CYSTOCELE);  Surgeon: Suzy Bouchard, MD;  Location: [facility];  Service: Gynecology;  Laterality: N/A;   ESOPHAGOGASTRODUODENOSCOPY N/A 08/03/2023   Procedure: ESOPHAGOGASTRODUODENOSCOPY (EGD);  Surgeon: Lemar Lofty., MD;  Location: [facility];  Service: Gastroenterology;  Laterality: N/A;   EUS N/A 08/03/2023   Procedure:  UPPER ENDOSCOPIC ULTRASOUND (EUS) RADIAL;  Surgeon: Lemar Lofty., MD;  Location: [facility];  Service: Gastroenterology;  Laterality: N/A;   LAPAROSCOPIC GASTRIC SLEEVE RESECTION WITH HIATAL HERNIA REPAIR  2015   TONSILLECTOMY     TUBAL LIGATION     VAGINAL HYSTERECTOMY N/A 02/08/2016   Procedure: HYSTERECTOMY VAGINAL WITH BSO;  Surgeon: Suzy Bouchard, MD;  Location: [facility];  Service: Gynecology;  Laterality: N/A;   VARICOSE VEIN SURGERY      Social History   Tobacco Use   Smoking status: Every Day    Current packs/day: 0.10    Average packs/day: 0.1 packs/day for 57.2 years (5.7 ttl pk-yrs)    Types: Cigarettes    Start date: 1968   Smokeless tobacco: Never   Tobacco comments:    2-3 daily  Vaping Use   Vaping status: Never Used  Substance Use Topics   Alcohol use: Not Currently    Alcohol/week: 0.0 standard drinks of alcohol   Drug use: No     Medication list has been reviewed and updated.  Current Meds  Medication Sig   fluticasone (FLONASE) 50 MCG/ACT nasal spray PLACE 1 SPRAY INTO BOTH NOSTRILS DAILY.   methadone (DOLOPHINE) 1 mg/ml oral solution Take by mouth daily. 100 mg   pantoprazole (PROTONIX) 40 MG tablet Take 1 tablet (40 mg total) by mouth 2 (two) times daily before a meal.       12/21/2023    9:27 AM 09/13/2022    2:42 PM 09/23/2021   10:46 AM 06/12/2021    9:35 AM  GAD 7 : Generalized Anxiety Score  Nervous, Anxious, on Edge  0 0 0  Control/stop worrying 0 0 0 0  Worry too much - different things 0 0 0 0  Trouble relaxing 0 0 0 0  Restless 0 0 0 0  Easily annoyed or irritable 0 0 0 0  Afraid - awful might happen 0 0 0 0  Total GAD 7 Score  0 0 0  Anxiety Difficulty Not difficult at all Not difficult at all Not difficult at all Not difficult at all       12/21/2023    9:27 AM 08/24/2023    3:53 PM 09/13/2022    2:42 PM  Depression screen PHQ 2/9  Decreased Interest 0 0 0  Down, Depressed, Hopeless 0 0 0  PHQ - 2 Score 0  0 0  Altered sleeping 0 0 0  Tired, decreased energy 0 0 0  Change in appetite 0 0 0  Feeling bad or failure about yourself  0 0 0  Trouble concentrating 0 0 0  Moving slowly or fidgety/restless 0 0 0  Suicidal thoughts 0 0 0  PHQ-9 Score 0 0 0  Difficult doing work/chores Not difficult at all Not difficult at all Not difficult at all    BP Readings from Last 3 Encounters:  12/21/23 (!) 150/80  08/21/23 119/73  08/03/23 (!) 158/83    Physical Exam Vitals and nursing note reviewed.  Constitutional:  General: She is not in acute distress.    Appearance: Normal appearance. She is well-developed.  HENT:     Head: Normocephalic and atraumatic.  Neck:     Vascular: No carotid bruit.  Cardiovascular:     Rate and Rhythm: Normal rate and regular rhythm.     Heart sounds: No murmur heard. Pulmonary:     Effort: Pulmonary effort is normal. No respiratory distress.     Breath sounds: No wheezing or rhonchi.  Musculoskeletal:     Cervical back: Normal range of motion.     Right lower leg: No edema.     Left lower leg: No edema.  Lymphadenopathy:     Cervical: No cervical adenopathy.  Skin:    General: Skin is warm and dry.     Findings: No rash.  Neurological:     General: No focal deficit present.     Mental Status: She is alert and oriented to person, place, and time.  Psychiatric:        Mood and Affect: Mood normal.        Behavior: Behavior normal.     Wt Readings from Last 3 Encounters:  12/21/23 180 lb (81.6 kg)  08/24/23 180 lb (81.6 kg)  08/21/23 188 lb 1.6 oz (85.3 kg)    BP (!) 150/80   Pulse (!) 102   Ht 5\' 4"  (1.626 m)   Wt 180 lb (81.6 kg)   SpO2 97%   BMI 30.90 kg/m   Assessment and Plan:  Problem List Items Addressed This Visit       Unprioritized   COPD with chronic bronchitis (HCC) (Chronic)   Intermittent mild shortness of breath and chest tightness, exacerbated by weather changes and spring pollen. Continue Albuterol MDI prn       Relevant Medications   albuterol (VENTOLIN HFA) 108 (90 Base) MCG/ACT inhaler   Essential hypertension - Primary (Chronic)   BP not controlled due to being out of medications. Encourage her to call for refills to avoid missed doses Resume Benicar.      Relevant Medications   olmesartan (BENICAR) 20 MG tablet   Gastritis and gastroduodenitis   Reflux symptoms are controlled on pantoprazole. Patient denies red flag symptoms - no melena, weight loss, dysphagia.       Other Visit Diagnoses       Colon cancer screening       Relevant Orders   Cologuard       Return in about 4 months (around 04/21/2024) for Medicare annual, HTN.    Reubin Milan, MD [facility] [place]

## 2023-12-21 NOTE — Telephone Encounter (Signed)
 Copied from CRM [id number]. Topic: Clinical - Prescription Issue >> Dec 21, 2023 10:18 AM Fredrich Romans wrote: Reason for CRM: Patient called in stating that flexeril was suppose to be sent into pharmacy for her per visit with provider today.However the pharmacy did not have prescription for her.

## 2023-12-21 NOTE — Assessment & Plan Note (Signed)
 BP not controlled due to being out of medications. Encourage her to call for refills to avoid missed doses Resume Benicar.

## 2023-12-21 NOTE — Assessment & Plan Note (Signed)
 Intermittent mild shortness of breath and chest tightness, exacerbated by weather changes and spring pollen. Continue Albuterol MDI prn

## 2023-12-21 NOTE — Telephone Encounter (Signed)
 Called pt let her know that she would need to call her specialist for them to send in a refill. Pt stated she doesn't feel lke she is still a patient there. Told pt to try to call provider to see if she can get an appointment and a refill. Pt states she will call them and call us if she feels like she really needs the medication. Pt stated she "isn't sure if it is working for her anyway's".

## 2023-12-21 NOTE — Telephone Encounter (Signed)
 Please review

## 2023-12-21 NOTE — Assessment & Plan Note (Signed)
 Reflux symptoms are controlled on pantoprazole. Patient denies red flag symptoms - no melena, weight loss, dysphagia.

## 2024-02-18 LAB — COLOGUARD: COLOGUARD: POSITIVE — AB

## 2024-02-19 ENCOUNTER — Ambulatory Visit: Payer: Self-pay | Admitting: Internal Medicine

## 2024-02-20 ENCOUNTER — Other Ambulatory Visit: Payer: Self-pay | Admitting: Internal Medicine

## 2024-02-20 ENCOUNTER — Telehealth: Payer: Self-pay | Admitting: Internal Medicine

## 2024-02-20 DIAGNOSIS — Z1211 Encounter for screening for malignant neoplasm of colon: Secondary | ICD-10-CM

## 2024-02-20 NOTE — Telephone Encounter (Signed)
 Copied from CRM [id number]. Topic: Referral - Request for Referral >> Feb 20, 2024  9:50 AM Lizabeth Riggs wrote: Did the patient discuss referral with their provider in the last year? Yes (If No - schedule appointment) (If Yes - send message)  Appointment offered? No  Type of order/referral and detailed reason for visit: Your Cologuard test is positive.  This means that you need a colonoscopy.  Do you have preference of gastroenterologist? Please rush per Thressa Flora; she is worried.   Preference of office, provider, location: [facility] at [facility]; [address] [place], [place] [address] Phone: [phone]  If referral order, have you been seen by this specialty before? No (If Yes, this issue or another issue? When? Where?  Can we respond through MyChart? No

## 2024-02-20 NOTE — Progress Notes (Unsigned)
 Date:  02/20/2024   Name:  Lori Trevino   DOB:  1950/11/22   MRN:  [id number]   Chief Complaint: No chief complaint on file.  HPI  Review of Systems   Lab Results  Component Value Date   NA 134 04/05/2023   K 5.0 04/05/2023   CO2 26 04/05/2023   GLUCOSE 79 04/05/2023   BUN 8 04/05/2023   CREATININE 0.64 04/05/2023   CALCIUM 9.4 04/05/2023   EGFR 93 04/05/2023   GFRNONAA >60 03/27/2023   Lab Results  Component Value Date   CHOL 245 (H) 09/13/2022   HDL 116 09/13/2022   LDLCALC 116 (H) 09/13/2022   TRIG 78 09/13/2022   CHOLHDL 2.1 09/13/2022   Lab Results  Component Value Date   TSH 2.290 09/13/2022   No results found for: "HGBA1C" Lab Results  Component Value Date   WBC 4.6 04/05/2023   HGB 10.5 (L) 04/05/2023   HCT 32.2 (L) 04/05/2023   MCV 92 04/05/2023   PLT 269 03/27/2023   Lab Results  Component Value Date   ALT 13 04/05/2023   AST 21 04/05/2023   ALKPHOS 97 04/05/2023   BILITOT 0.3 04/05/2023   Lab Results  Component Value Date   VD25OH 32.01 09/13/2021     Patient Active Problem List   Diagnosis Date Noted   Dilated bile duct 08/03/2023   Gastric ulcer without hemorrhage or perforation 08/03/2023   Gastritis and gastroduodenitis 08/03/2023   Morbid obesity (HCC) 03/30/2023   Arthritis 03/30/2023   Snoring 03/30/2023   Lymphedema 11/28/2022   Gastroesophageal reflux disease 09/13/2022   Chronic venous insufficiency 07/31/2022   Raynaud'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 12/17/2020   Pedal edema 02/26/2020   Sensorineural hearing loss (SNHL) of both ears 09/17/2019   Essential hypertension 08/23/2019   Tinnitus of both ears 08/23/2019   Lump on finger 02/13/2018   Swelling of finger of left hand 02/09/2018   Gait instability 10/27/2017   Environmental and seasonal allergies 02/14/2017   Mixed hyperlipidemia 12/08/2015   Body mass index (BMI) greater than 25 06/09/2015   Radiculopathy of lumbar region 06/16/2014   COPD with chronic  bronchitis (HCC) 06/12/2014   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HCC) 06/12/2014   Chronic insomnia 06/12/2014   Vitamin D deficiency 05/31/2014    Allergies  Allergen Reactions   Neurontin [Gabapentin] Swelling   Zolpidem Other (See Comments)    sleepwalking     Past Surgical History:  Procedure Laterality Date   AXILLARY SURGERY Right 2008   BIOPSY  08/03/2023   Procedure: BIOPSY;  Surgeon: Normie Becton., MD;  Location: [facility];  Service: Gastroenterology;;   CHOLECYSTECTOMY     COLONOSCOPY WITH PROPOFOL  N/A 08/21/2023   Procedure: COLONOSCOPY WITH PROPOFOL ;  Surgeon: Marnee Sink, MD;  Location: [facility];  Service: Endoscopy;  Laterality: N/A;  poor prep   CYSTOCELE REPAIR N/A 02/08/2016   Procedure: ANTERIOR REPAIR (CYSTOCELE);  Surgeon: Carolynn Citrin, MD;  Location: [facility];  Service: Gynecology;  Laterality: N/A;   ESOPHAGOGASTRODUODENOSCOPY N/A 08/03/2023   Procedure: ESOPHAGOGASTRODUODENOSCOPY (EGD);  Surgeon: Normie Becton., MD;  Location: [facility];  Service: Gastroenterology;  Laterality: N/A;   EUS N/A 08/03/2023   Procedure: UPPER ENDOSCOPIC ULTRASOUND (EUS) RADIAL;  Surgeon: Normie Becton., MD;  Location: [facility];  Service: Gastroenterology;  Laterality: N/A;   LAPAROSCOPIC GASTRIC SLEEVE RESECTION WITH HIATAL HERNIA REPAIR  2015   TONSILLECTOMY     TUBAL LIGATION  VAGINAL HYSTERECTOMY N/A 02/08/2016   Procedure: HYSTERECTOMY VAGINAL WITH BSO;  Surgeon: Carolynn Citrin, MD;  Location: [facility];  Service: Gynecology;  Laterality: N/A;   VARICOSE VEIN SURGERY      Social History   Tobacco Use   Smoking status: Every Day    Current packs/day: 0.10    Average packs/day: 0.1 packs/day for 57.4 years (5.7 ttl pk-yrs)    Types: Cigarettes    Start date: 1968   Smokeless tobacco: Never   Tobacco comments:    2-3 daily  Vaping Use   Vaping status: Never Used  Substance Use  Topics   Alcohol use: Not Currently    Alcohol/week: 0.0 standard drinks of alcohol   Drug use: No     Medication list has been reviewed and updated.  No outpatient medications have been marked as taking for the 02/20/24 encounter (Orders Only) with Lori Dixons, MD.       12/21/2023    9:27 AM 09/13/2022    2:42 PM 09/23/2021   10:46 AM 06/12/2021    9:35 AM  GAD 7 : Generalized Anxiety Score  Nervous, Anxious, on Edge  0 0 0  Control/stop worrying 0 0 0 0  Worry too much - different things 0 0 0 0  Trouble relaxing 0 0 0 0  Restless 0 0 0 0  Easily annoyed or irritable 0 0 0 0  Afraid - awful might happen 0 0 0 0  Total GAD 7 Score  0 0 0  Anxiety Difficulty Not difficult at all Not difficult at all Not difficult at all Not difficult at all       12/21/2023    9:27 AM 08/24/2023    3:53 PM 09/13/2022    2:42 PM  Depression screen PHQ 2/9  Decreased Interest 0 0 0  Down, Depressed, Hopeless 0 0 0  PHQ - 2 Score 0 0 0  Altered sleeping 0 0 0  Tired, decreased energy 0 0 0  Change in appetite 0 0 0  Feeling bad or failure about yourself  0 0 0  Trouble concentrating 0 0 0  Moving slowly or fidgety/restless 0 0 0  Suicidal thoughts 0 0 0  PHQ-9 Score 0 0 0  Difficult doing work/chores Not difficult at all Not difficult at all Not difficult at all    BP Readings from Last 3 Encounters:  12/21/23 (!) 150/80  08/21/23 119/73  08/03/23 (!) 158/83    Physical Exam  Wt Readings from Last 3 Encounters:  12/21/23 180 lb (81.6 kg)  08/24/23 180 lb (81.6 kg)  08/21/23 188 lb 1.6 oz (85.3 kg)    There were no vitals taken for this visit.  Assessment and Plan:  Problem List Items Addressed This Visit   None   No follow-ups on file.    Lori Dixons, MD [facility] [place]

## 2024-02-20 NOTE — Telephone Encounter (Signed)
Please review referral request

## 2024-02-21 ENCOUNTER — Telehealth: Payer: Self-pay | Admitting: Physician Assistant

## 2024-02-21 NOTE — Telephone Encounter (Signed)
 The patient called to schedule her repeat colonoscopy. She requested that the procedure be done in [place].

## 2024-02-22 ENCOUNTER — Other Ambulatory Visit: Payer: Self-pay

## 2024-02-22 ENCOUNTER — Telehealth: Payer: Self-pay

## 2024-02-22 DIAGNOSIS — D649 Anemia, unspecified: Secondary | ICD-10-CM

## 2024-02-22 MED ORDER — SUTAB 1479-225-188 MG PO TABS
ORAL_TABLET | ORAL | 0 refills | Status: AC
Start: 2024-02-22 — End: 2024-02-24

## 2024-02-22 NOTE — Telephone Encounter (Signed)
 Spoke with Lori Trevino-scheduled Colonoscopy 03-11-24 with Sutab. Mailed instructions. She stated she had problems with drinking a lot of fluids so we decided to try sutab. If insurance doesn't cover she will call office for sample.

## 2024-03-04 ENCOUNTER — Encounter: Payer: Self-pay | Admitting: Gastroenterology

## 2024-03-08 ENCOUNTER — Telehealth: Payer: Self-pay

## 2024-03-08 NOTE — Telephone Encounter (Signed)
 Pt contacted office to cancel/reschedule her colonoscopy with Dr. Ole Berkeley that was scheduled for 03/11/24 due to transportation.  Informed patient that she can call back to reschedule once she checks with her driver to let me know when she would like to reschedule.  Procedure has been canceled.  Colonoscopy 69629 Anemia D64.9 Canceled Dr. Ole Berkeley [facility]  Thanks,  [place], CMA

## 2024-03-11 ENCOUNTER — Ambulatory Visit: Admission: RE | Admit: 2024-03-11 | Source: Ambulatory Visit | Admitting: Gastroenterology

## 2024-03-11 SURGERY — COLONOSCOPY
Anesthesia: General

## 2024-03-13 ENCOUNTER — Telehealth: Payer: Self-pay

## 2024-03-13 NOTE — Telephone Encounter (Signed)
 Returned phone call to patient to reschedule her previously canceled colonoscopy with Dr. Ole Berkeley due to transportation.  Note: Colonoscopy 33295 Anemia D64.9  Former patient of Tina's.  Thanks,  [place], [place]

## 2024-03-25 ENCOUNTER — Telehealth: Payer: Self-pay

## 2024-03-25 ENCOUNTER — Other Ambulatory Visit: Payer: Self-pay

## 2024-03-25 DIAGNOSIS — D649 Anemia, unspecified: Secondary | ICD-10-CM

## 2024-03-25 NOTE — Telephone Encounter (Signed)
 Colonoscopy has been rescheduled to 04/03/24  with Dr. Marinda at [facility].  Instructions updated. Referral updated.  Pt has been asked to stop by office to pick ups SuTab  from previously schedule procedures.  Thanks,  [place], CMA

## 2024-03-25 NOTE — Telephone Encounter (Signed)
 Returned phone call from patient received on Friday requesting to get colonoscopy rescheduled from previously canceled procedure.  LVM for her to call me back.  Thanks,  [place], CMA

## 2024-03-28 ENCOUNTER — Telehealth: Payer: Self-pay

## 2024-03-28 NOTE — Telephone Encounter (Signed)
 Called pt left VM to call back. Pt is 73 years old jury duty cut off is 72. Pt needs to call or write on the paper that she is 73 years old.  KP

## 2024-03-28 NOTE — Telephone Encounter (Signed)
 Spoke with patient she would like to have colonoscopy performed in [place].  I gave her the number to [facility] to call them and get rescheduled or change locations of SX  JM  Copied from CRM [id number]. Topic: Appointments - Scheduling Inquiry for Clinic >> Mar 28, 2024 12:15 PM Lori Trevino wrote: Reason for CRM: Pt calling to relocate surgery 7/2 due to not allowed to take [place]. Callback number is [phone].

## 2024-03-28 NOTE — Telephone Encounter (Signed)
 Copied from CRM [id number]. Topic: Medical Record Request - Records Request >> Mar 28, 2024 10:06 AM Wyona SQUIBB wrote: Reason for CRM: pt needs a Jury excuse signed and needs to be before 04/11/2024. Pt needs to be excused from jury duty due to back scoliosis condition and has a form that needs to be signed.  Pt would like to know if appt is needed or can paper be dropped off.    PT [phone]

## 2024-03-29 ENCOUNTER — Telehealth: Payer: Self-pay

## 2024-03-29 NOTE — Telephone Encounter (Signed)
 Voice message has been left for pt to call office to discuss colonoscopy.    Unfortunately [facility] is not an option for her to have her colonoscopy due to staff changes we are only scheduling at [facility].  I will convey this to her when she calls back.  Thanks,  [place], CMA

## 2024-03-29 NOTE — Telephone Encounter (Signed)
 Discussed colonoscopy appt with patient currently scheduled with Dr. Jinny 04/03/24.  She is unable to get transportation for her colonoscopy because her son is battling brain cancer.  She has asked to have her colonoscopy at [facility].  I informed her that one of our former provider's Dr. Unk may be doing procedures in [place] in the future but she is with [facility] now but I can message her to see if she has started to do them in [place].  Venetia appreciated my efforts to help her.  I told her I will reach out to her on Monday after I check with Dr. Unk to see if she will be doing procedures.  If she says she will be doing procedures in [place] I will message her PCP to send a referral to [facility].  Thanks,  [place], CMa

## 2024-04-01 ENCOUNTER — Other Ambulatory Visit: Payer: Self-pay | Admitting: Internal Medicine

## 2024-04-01 DIAGNOSIS — R9389 Abnormal findings on diagnostic imaging of other specified body structures: Secondary | ICD-10-CM

## 2024-04-01 DIAGNOSIS — K639 Disease of intestine, unspecified: Secondary | ICD-10-CM

## 2024-04-01 NOTE — Telephone Encounter (Signed)
 Colonoscopy canceled for 04/03/24.  Pt needed procedure at [facility].  PCP has been asked to send over a new referral to [facility] (Dr. Therisa or Dr. Unk). Inbasket messaged from referral.  Thanks, [place], CMA

## 2024-04-01 NOTE — Progress Notes (Unsigned)
 Date:  04/01/2024   Name:  Lori Trevino   DOB:  Mar 21, 1951   MRN:  [id number]   Chief Complaint: No chief complaint on file.  HPI  Review of Systems   Lab Results  Component Value Date   NA 134 04/05/2023   K 5.0 04/05/2023   CO2 26 04/05/2023   GLUCOSE 79 04/05/2023   BUN 8 04/05/2023   CREATININE 0.64 04/05/2023   CALCIUM 9.4 04/05/2023   EGFR 93 04/05/2023   GFRNONAA >60 03/27/2023   Lab Results  Component Value Date   CHOL 245 (H) 09/13/2022   HDL 116 09/13/2022   LDLCALC 116 (H) 09/13/2022   TRIG 78 09/13/2022   CHOLHDL 2.1 09/13/2022   Lab Results  Component Value Date   TSH 2.290 09/13/2022   No results found for: HGBA1C Lab Results  Component Value Date   WBC 4.6 04/05/2023   HGB 10.5 (L) 04/05/2023   HCT 32.2 (L) 04/05/2023   MCV 92 04/05/2023   PLT 269 03/27/2023   Lab Results  Component Value Date   ALT 13 04/05/2023   AST 21 04/05/2023   ALKPHOS 97 04/05/2023   BILITOT 0.3 04/05/2023   Lab Results  Component Value Date   VD25OH 32.01 09/13/2021     Patient Active Problem List   Diagnosis Date Noted   Dilated bile duct 08/03/2023   Gastric ulcer without hemorrhage or perforation 08/03/2023   Gastritis and gastroduodenitis 08/03/2023   Morbid obesity (HCC) 03/30/2023   Arthritis 03/30/2023   Snoring 03/30/2023   Lymphedema 11/28/2022   Gastroesophageal reflux disease 09/13/2022   Chronic venous insufficiency 07/31/2022   Raynaud'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 12/17/2020   Pedal edema 02/26/2020   Sensorineural hearing loss (SNHL) of both ears 09/17/2019   Essential hypertension 08/23/2019   Tinnitus of both ears 08/23/2019   Lump on finger 02/13/2018   Swelling of finger of left hand 02/09/2018   Gait instability 10/27/2017   Environmental and seasonal allergies 02/14/2017   Mixed hyperlipidemia 12/08/2015   Body mass index (BMI) greater than 25 06/09/2015   Radiculopathy of lumbar region 06/16/2014   COPD with chronic  bronchitis (HCC) 06/12/2014   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HCC) 06/12/2014   Chronic insomnia 06/12/2014   Vitamin D deficiency 05/31/2014    Allergies  Allergen Reactions   Neurontin [Gabapentin] Swelling    Past Surgical History:  Procedure Laterality Date   AXILLARY SURGERY Right 2008   BIOPSY  08/03/2023   Procedure: BIOPSY;  Surgeon: Wilhelmenia Aloha Raddle., MD;  Location: [facility];  Service: Gastroenterology;;   CHOLECYSTECTOMY     COLONOSCOPY WITH PROPOFOL  N/A 08/21/2023   Procedure: COLONOSCOPY WITH PROPOFOL ;  Surgeon: Jinny Carmine, MD;  Location: [facility];  Service: Endoscopy;  Laterality: N/A;  poor prep   CYSTOCELE REPAIR N/A 02/08/2016   Procedure: ANTERIOR REPAIR (CYSTOCELE);  Surgeon: Debby JINNY Dinsmore, MD;  Location: [facility];  Service: Gynecology;  Laterality: N/A;   ESOPHAGOGASTRODUODENOSCOPY N/A 08/03/2023   Procedure: ESOPHAGOGASTRODUODENOSCOPY (EGD);  Surgeon: Wilhelmenia Aloha Raddle., MD;  Location: [facility];  Service: Gastroenterology;  Laterality: N/A;   EUS N/A 08/03/2023   Procedure: UPPER ENDOSCOPIC ULTRASOUND (EUS) RADIAL;  Surgeon: Wilhelmenia Aloha Raddle., MD;  Location: [facility];  Service: Gastroenterology;  Laterality: N/A;   LAPAROSCOPIC GASTRIC SLEEVE RESECTION WITH HIATAL HERNIA REPAIR  2015   TONSILLECTOMY     TUBAL LIGATION     VAGINAL HYSTERECTOMY N/A 02/08/2016   Procedure: HYSTERECTOMY VAGINAL  WITH BSO;  Surgeon: Debby JINNY Dinsmore, MD;  Location: [facility];  Service: Gynecology;  Laterality: N/A;   VARICOSE VEIN SURGERY      Social History   Tobacco Use   Smoking status: Some Days    Current packs/day: 0.10    Average packs/day: 0.1 packs/day for 57.5 years (5.7 ttl pk-yrs)    Types: Cigarettes    Start date: 1968   Smokeless tobacco: Never   Tobacco comments:    2-3 daily  Vaping Use   Vaping status: Never Used  Substance Use Topics   Alcohol use: Not Currently     Alcohol/week: 0.0 standard drinks of alcohol   Drug use: No     Medication list has been reviewed and updated.  No outpatient medications have been marked as taking for the 04/01/24 encounter (Orders Only) with Justus Leita DEL, MD.       12/21/2023    9:27 AM 09/13/2022    2:42 PM 09/23/2021   10:46 AM 06/12/2021    9:35 AM  GAD 7 : Generalized Anxiety Score  Nervous, Anxious, on Edge  0 0 0  Control/stop worrying 0 0 0 0  Worry too much - different things 0 0 0 0  Trouble relaxing 0 0 0 0  Restless 0 0 0 0  Easily annoyed or irritable 0 0 0 0  Afraid - awful might happen 0 0 0 0  Total GAD 7 Score  0 0 0  Anxiety Difficulty Not difficult at all Not difficult at all Not difficult at all Not difficult at all       12/21/2023    9:27 AM 08/24/2023    3:53 PM 09/13/2022    2:42 PM  Depression screen PHQ 2/9  Decreased Interest 0 0 0  Down, Depressed, Hopeless 0 0 0  PHQ - 2 Score 0 0 0  Altered sleeping 0 0 0  Tired, decreased energy 0 0 0  Change in appetite 0 0 0  Feeling bad or failure about yourself  0 0 0  Trouble concentrating 0 0 0  Moving slowly or fidgety/restless 0 0 0  Suicidal thoughts 0 0 0  PHQ-9 Score 0 0 0  Difficult doing work/chores Not difficult at all Not difficult at all Not difficult at all    BP Readings from Last 3 Encounters:  12/21/23 (!) 150/80  08/21/23 119/73  08/03/23 (!) 158/83    Physical Exam  Wt Readings from Last 3 Encounters:  12/21/23 180 lb (81.6 kg)  08/24/23 180 lb (81.6 kg)  08/21/23 188 lb 1.6 oz (85.3 kg)    There were no vitals taken for this visit.  Assessment and Plan:  Problem List Items Addressed This Visit   None   No follow-ups on file.    Leita HILARIO Justus, MD [facility] [place]

## 2024-04-03 ENCOUNTER — Encounter: Admission: RE | Payer: Self-pay | Source: Ambulatory Visit

## 2024-04-03 ENCOUNTER — Ambulatory Visit: Admission: RE | Admit: 2024-04-03 | Source: Ambulatory Visit | Admitting: General Surgery

## 2024-04-03 SURGERY — COLONOSCOPY
Anesthesia: General

## 2024-04-22 ENCOUNTER — Ambulatory Visit: Admitting: Internal Medicine

## 2024-04-22 ENCOUNTER — Encounter: Payer: Self-pay | Admitting: Internal Medicine

## 2024-04-22 NOTE — Progress Notes (Deleted)
 Date:  04/22/2024   Name:  Lori Trevino   DOB:  1950-10-16   MRN:  [id number]   Chief Complaint: No chief complaint on file. Lori Trevino is a 73 y.o. female who presents today for her Complete Annual Exam. She feels {DESC; WELL/FAIRLY WELL/POORLY:18703}. She reports exercising ***. She reports she is sleeping {DESC; WELL/FAIRLY WELL/POORLY:18703}. Breast complaints ***.  Health Maintenance  Topic Date Due   Zoster (Shingles) Vaccine (1 of 2) Never done   DEXA scan (bone density measurement)  Never done   Mammogram  06/16/2017   COVID-19 Vaccine (5 - 2024-25 season) 06/04/2023   Flu Shot  05/03/2024   DTaP/Tdap/Td vaccine (2 - Td or Tdap) 07/16/2024   Medicare Annual Wellness Visit  08/23/2024   Cologuard (Stool DNA test)  02/13/2027   Pneumococcal Vaccine for age over 11  Completed   Hepatitis C Screening  Completed   Hepatitis B Vaccine  Aged Out   HPV Vaccine  Aged Out   Meningitis B Vaccine  Aged Out   Colon Cancer Screening  Discontinued    Hypertension This is a chronic problem. The problem is controlled. Associated symptoms include shortness of breath. Pertinent negatives include no chest pain, headaches or palpitations. Past treatments include angiotensin blockers. The current treatment provides moderate improvement.  Gastroesophageal Reflux She complains of heartburn. She reports no abdominal pain, no chest pain, no coughing or no wheezing. This is a recurrent problem. The problem occurs occasionally. Pertinent negatives include no fatigue. She has tried a PPI for the symptoms.  COPD - uses albuterol  MDI prn, not on any maintenance medications.  Review of Systems  Constitutional:  Negative for fatigue and unexpected weight change.  HENT:  Negative for trouble swallowing.   Eyes:  Negative for visual disturbance.  Respiratory:  Positive for chest tightness and shortness of breath. Negative for cough and wheezing.   Cardiovascular:  Negative for chest pain, palpitations  and leg swelling.  Gastrointestinal:  Positive for heartburn. Negative for abdominal pain, constipation and diarrhea.  Genitourinary:  Negative for dysuria and hematuria.  Musculoskeletal:  Negative for arthralgias and myalgias.  Neurological:  Negative for dizziness, weakness, light-headedness and headaches.  Psychiatric/Behavioral:  Negative for dysphoric mood and sleep disturbance. The patient is not nervous/anxious.      Lab Results  Component Value Date   NA 134 04/05/2023   K 5.0 04/05/2023   CO2 26 04/05/2023   GLUCOSE 79 04/05/2023   BUN 8 04/05/2023   CREATININE 0.64 04/05/2023   CALCIUM 9.4 04/05/2023   EGFR 93 04/05/2023   GFRNONAA >60 03/27/2023   Lab Results  Component Value Date   CHOL 245 (H) 09/13/2022   HDL 116 09/13/2022   LDLCALC 116 (H) 09/13/2022   TRIG 78 09/13/2022   CHOLHDL 2.1 09/13/2022   Lab Results  Component Value Date   TSH 2.290 09/13/2022   No results found for: HGBA1C Lab Results  Component Value Date   WBC 4.6 04/05/2023   HGB 10.5 (L) 04/05/2023   HCT 32.2 (L) 04/05/2023   MCV 92 04/05/2023   PLT 269 03/27/2023   Lab Results  Component Value Date   ALT 13 04/05/2023   AST 21 04/05/2023   ALKPHOS 97 04/05/2023   BILITOT 0.3 04/05/2023   Lab Results  Component Value Date   VD25OH 32.01 09/13/2021     Patient Active Problem List   Diagnosis Date Noted   Dilated bile duct 08/03/2023   Gastric ulcer without hemorrhage  or perforation 08/03/2023   Gastritis and gastroduodenitis 08/03/2023   Morbid obesity (HCC) 03/30/2023   Arthritis 03/30/2023   Snoring 03/30/2023   Lymphedema 11/28/2022   Gastroesophageal reflux disease 09/13/2022   Chronic venous insufficiency 07/31/2022   Raynaud'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 12/17/2020   Pedal edema 02/26/2020   Sensorineural hearing loss (SNHL) of both ears 09/17/2019   Essential hypertension 08/23/2019   Tinnitus of both ears 08/23/2019   Lump on finger 02/13/2018   Swelling of  finger of left hand 02/09/2018   Gait instability 10/27/2017   Environmental and seasonal allergies 02/14/2017   Mixed hyperlipidemia 12/08/2015   Body mass index (BMI) greater than 25 06/09/2015   Radiculopathy of lumbar region 06/16/2014   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HCC) 06/12/2014   Chronic insomnia 06/12/2014   Vitamin D deficiency 05/31/2014    Allergies  Allergen Reactions   Neurontin [Gabapentin] Swelling    Past Surgical History:  Procedure Laterality Date   AXILLARY SURGERY Right 2008   BIOPSY  08/03/2023   Procedure: BIOPSY;  Surgeon: Wilhelmenia Aloha Raddle., MD;  Location: [facility];  Service: Gastroenterology;;   CHOLECYSTECTOMY     COLONOSCOPY WITH PROPOFOL  N/A 08/21/2023   Procedure: COLONOSCOPY WITH PROPOFOL ;  Surgeon: Jinny Carmine, MD;  Location: [facility];  Service: Endoscopy;  Laterality: N/A;  poor prep   CYSTOCELE REPAIR N/A 02/08/2016   Procedure: ANTERIOR REPAIR (CYSTOCELE);  Surgeon: Debby JINNY Dinsmore, MD;  Location: [facility];  Service: Gynecology;  Laterality: N/A;   ESOPHAGOGASTRODUODENOSCOPY N/A 08/03/2023   Procedure: ESOPHAGOGASTRODUODENOSCOPY (EGD);  Surgeon: Wilhelmenia Aloha Raddle., MD;  Location: [facility];  Service: Gastroenterology;  Laterality: N/A;   EUS N/A 08/03/2023   Procedure: UPPER ENDOSCOPIC ULTRASOUND (EUS) RADIAL;  Surgeon: Wilhelmenia Aloha Raddle., MD;  Location: [facility];  Service: Gastroenterology;  Laterality: N/A;   LAPAROSCOPIC GASTRIC SLEEVE RESECTION WITH HIATAL HERNIA REPAIR  2015   TONSILLECTOMY     TUBAL LIGATION     VAGINAL HYSTERECTOMY N/A 02/08/2016   Procedure: HYSTERECTOMY VAGINAL WITH BSO;  Surgeon: Debby JINNY Dinsmore, MD;  Location: [facility];  Service: Gynecology;  Laterality: N/A;   VARICOSE VEIN SURGERY      Social History   Tobacco Use   Smoking status: Some Days    Current packs/day: 0.10    Average  packs/day: 0.1 packs/day for 57.6 years (5.8 ttl pk-yrs)    Types: Cigarettes    Start date: 1968   Smokeless tobacco: Never   Tobacco comments:    2-3 daily  Vaping Use   Vaping status: Never Used  Substance Use Topics   Alcohol use: Not Currently    Alcohol/week: 0.0 standard drinks of alcohol   Drug use: No     Medication list has been reviewed and updated.  No outpatient medications have been marked as taking for the 04/22/24 encounter (Appointment) with Justus Leita DEL, MD.       12/21/2023    9:27 AM 09/13/2022    2:42 PM 09/23/2021   10:46 AM 06/12/2021    9:35 AM  GAD 7 : Generalized Anxiety Score  Nervous, Anxious, on Edge  0 0 0  Control/stop worrying 0 0 0 0  Worry too much - different things 0 0 0 0  Trouble relaxing 0 0 0 0  Restless 0 0 0 0  Easily annoyed or irritable 0 0 0 0  Afraid - awful might happen 0 0 0 0  Total GAD 7 Score  0 0 0  Anxiety Difficulty Not difficult at all Not difficult at all Not difficult at all Not difficult at all       12/21/2023    9:27 AM 08/24/2023    3:53 PM 09/13/2022    2:42 PM  Depression screen PHQ 2/9  Decreased Interest 0 0 0  Down, Depressed, Hopeless 0 0 0  PHQ - 2 Score 0 0 0  Altered sleeping 0 0 0  Tired, decreased energy 0 0 0  Change in appetite 0 0 0  Feeling bad or failure about yourself  0 0 0  Trouble concentrating 0 0 0  Moving slowly or fidgety/restless 0 0 0  Suicidal thoughts 0 0 0  PHQ-9 Score 0 0 0  Difficult doing work/chores Not difficult at all Not difficult at all Not difficult at all    BP Readings from Last 3 Encounters:  12/21/23 (!) 150/80  08/21/23 119/73  08/03/23 (!) 158/83    Physical Exam Vitals and nursing note reviewed.  Constitutional:      General: She is not in acute distress.    Appearance: She is well-developed.  HENT:     Head: Normocephalic and atraumatic.     Right Ear: Tympanic membrane and ear canal normal.     Left Ear: Tympanic membrane and ear canal  normal.     Nose:     Right Sinus: No maxillary sinus tenderness.     Left Sinus: No maxillary sinus tenderness.  Eyes:     General: No scleral icterus.       Right eye: No discharge.        Left eye: No discharge.     Conjunctiva/sclera: Conjunctivae normal.  Neck:     Thyroid : No thyromegaly.     Vascular: No carotid bruit.  Cardiovascular:     Rate and Rhythm: Normal rate and regular rhythm.     Pulses: Normal pulses.     Heart sounds: Normal heart sounds.  Pulmonary:     Effort: Pulmonary effort is normal. No respiratory distress.     Breath sounds: No wheezing.  Abdominal:     General: Bowel sounds are normal.     Palpations: Abdomen is soft.     Tenderness: There is no abdominal tenderness.  Musculoskeletal:     Cervical back: Normal range of motion. No erythema.     Right lower leg: No edema.     Left lower leg: No edema.  Lymphadenopathy:     Cervical: No cervical adenopathy.  Skin:    General: Skin is warm and dry.     Findings: No rash.  Neurological:     Mental Status: She is alert and oriented to person, place, and time.     Cranial Nerves: No cranial nerve deficit.     Sensory: No sensory deficit.     Deep Tendon Reflexes: Reflexes are normal and symmetric.  Psychiatric:        Attention and Perception: Attention normal.        Mood and Affect: Mood normal.     Wt Readings from Last 3 Encounters:  12/21/23 180 lb (81.6 kg)  08/24/23 180 lb (81.6 kg)  08/21/23 188 lb 1.6 oz (85.3 kg)    There were no vitals taken for this visit.  Assessment and Plan:  Problem List Items Addressed This Visit   None   No follow-ups on file.    Leita HILARIO Adie, MD [facility] [place]

## 2024-06-19 ENCOUNTER — Encounter: Payer: Self-pay | Admitting: Gastroenterology

## 2024-06-23 ENCOUNTER — Other Ambulatory Visit: Payer: Self-pay | Admitting: Internal Medicine

## 2024-06-23 DIAGNOSIS — I10 Essential (primary) hypertension: Secondary | ICD-10-CM

## 2024-06-24 NOTE — Telephone Encounter (Signed)
 Requested medications are due for refill today.  yes  Requested medications are on the active medications list.  yes  Last refill. 3/20/205 #100 1 rf  Future visit scheduled.   yes  Notes to clinic.  Labs are expired.    Requested Prescriptions  Pending Prescriptions Disp Refills   olmesartan  (BENICAR ) 20 MG tablet [Pharmacy Med Name: OLMESARTAN  MEDOXOMIL 20 MG TAB] 90 tablet 2    Sig: TAKE 1 TABLET BY MOUTH EVERY DAY     Cardiovascular:  Angiotensin Receptor Blockers Failed - 06/24/2024  4:15 PM      Failed - Cr in normal range and within 180 days    Creatinine, Ser  Date Value Ref Range Status  04/05/2023 0.64 0.57 - 1.00 mg/dL Final         Failed - K in normal range and within 180 days    Potassium  Date Value Ref Range Status  04/05/2023 5.0 3.5 - 5.2 mmol/L Final         Failed - Last BP in normal range    BP Readings from Last 1 Encounters:  12/21/23 (!) 150/80         Failed - Valid encounter within last 6 months    Recent Outpatient Visits           6 months ago Essential hypertension   [facility], Leita DEL, MD              Passed - Patient is not pregnant

## 2024-06-25 ENCOUNTER — Other Ambulatory Visit: Payer: Self-pay

## 2024-06-25 ENCOUNTER — Ambulatory Visit
Admission: RE | Admit: 2024-06-25 | Discharge: 2024-06-25 | Disposition: A | Discharge status: Home / Self Care | Attending: Gastroenterology | Admitting: Gastroenterology

## 2024-06-25 ENCOUNTER — Ambulatory Visit: Payer: Self-pay | Admitting: Anesthesiology

## 2024-06-25 ENCOUNTER — Encounter
Admission: RE | Disposition: A | Discharge status: Home / Self Care | Payer: Self-pay | Source: Home / Self Care | Attending: Gastroenterology

## 2024-06-25 ENCOUNTER — Encounter: Payer: Self-pay | Admitting: Gastroenterology

## 2024-06-25 DIAGNOSIS — F17210 Nicotine dependence, cigarettes, uncomplicated: Secondary | ICD-10-CM | POA: Diagnosis not present

## 2024-06-25 DIAGNOSIS — Z8711 Personal history of peptic ulcer disease: Secondary | ICD-10-CM | POA: Diagnosis not present

## 2024-06-25 DIAGNOSIS — E119 Type 2 diabetes mellitus without complications: Secondary | ICD-10-CM | POA: Diagnosis not present

## 2024-06-25 DIAGNOSIS — R933 Abnormal findings on diagnostic imaging of other parts of digestive tract: Secondary | ICD-10-CM | POA: Diagnosis present

## 2024-06-25 DIAGNOSIS — D125 Benign neoplasm of sigmoid colon: Secondary | ICD-10-CM | POA: Diagnosis not present

## 2024-06-25 DIAGNOSIS — K5730 Diverticulosis of large intestine without perforation or abscess without bleeding: Secondary | ICD-10-CM | POA: Insufficient documentation

## 2024-06-25 DIAGNOSIS — I10 Essential (primary) hypertension: Secondary | ICD-10-CM | POA: Insufficient documentation

## 2024-06-25 DIAGNOSIS — Z538 Procedure and treatment not carried out for other reasons: Secondary | ICD-10-CM | POA: Insufficient documentation

## 2024-06-25 DIAGNOSIS — D509 Iron deficiency anemia, unspecified: Secondary | ICD-10-CM | POA: Diagnosis present

## 2024-06-25 DIAGNOSIS — G709 Myoneural disorder, unspecified: Secondary | ICD-10-CM | POA: Insufficient documentation

## 2024-06-25 DIAGNOSIS — J449 Chronic obstructive pulmonary disease, unspecified: Secondary | ICD-10-CM | POA: Insufficient documentation

## 2024-06-25 DIAGNOSIS — K449 Diaphragmatic hernia without obstruction or gangrene: Secondary | ICD-10-CM | POA: Diagnosis not present

## 2024-06-25 DIAGNOSIS — K219 Gastro-esophageal reflux disease without esophagitis: Secondary | ICD-10-CM | POA: Diagnosis not present

## 2024-06-25 HISTORY — DX: Unspecified osteoarthritis, unspecified site: M19.90

## 2024-06-25 HISTORY — DX: Dyspnea, unspecified: R06.00

## 2024-06-25 HISTORY — PX: POLYPECTOMY: SHX149

## 2024-06-25 HISTORY — PX: COLONOSCOPY: SHX5424

## 2024-06-25 HISTORY — DX: Prediabetes: R73.03

## 2024-06-25 SURGERY — COLONOSCOPY
Anesthesia: General

## 2024-06-25 MED ORDER — LIDOCAINE HCL (PF) 2 % IJ SOLN
INTRAMUSCULAR | Status: AC
  Filled 2024-06-25: qty 5

## 2024-06-25 MED ORDER — PROPOFOL 10 MG/ML IV BOLUS
INTRAVENOUS | Status: DC | PRN
  Administered 2024-06-25: 50 mg via INTRAVENOUS
  Administered 2024-06-25: 120 ug/kg/min via INTRAVENOUS
  Administered 2024-06-25: 60 mg via INTRAVENOUS
  Administered 2024-06-25: 100 mg via INTRAVENOUS
  Administered 2024-06-25 (×2): 50 mg via INTRAVENOUS

## 2024-06-25 MED ORDER — PROPOFOL 1000 MG/100ML IV EMUL
INTRAVENOUS | Status: AC
Start: 2024-06-25 — End: 2024-06-25
  Filled 2024-06-25: qty 100

## 2024-06-25 MED ORDER — SODIUM CHLORIDE 0.9 % IV SOLN: INTRAVENOUS | Status: DC

## 2024-06-25 MED ORDER — STERILE WATER FOR IRRIGATION IR SOLN: Status: DC | PRN

## 2024-06-25 MED ORDER — LIDOCAINE HCL (CARDIAC) PF 100 MG/5ML IV SOSY
PREFILLED_SYRINGE | INTRAVENOUS | Status: DC | PRN
  Administered 2024-06-25: 80 mg via INTRAVENOUS

## 2024-06-25 MED ORDER — LACTATED RINGERS IV SOLN: INTRAVENOUS | Status: DC

## 2024-06-25 MED ORDER — STERILE WATER FOR IRRIGATION IR SOLN
Status: DC | PRN
  Administered 2024-06-25: 1

## 2024-06-25 SURGICAL SUPPLY — 7 items
CLIP HMST11XOPN 235X2.8X (MISCELLANEOUS) IMPLANT
ELECTRODE REM PT RTRN 9FT ADLT (ELECTROSURGICAL) IMPLANT
GOWN CVR UNV OPN BCK APRN NK (MISCELLANEOUS) ×2 IMPLANT
KIT PROCEDURE OLYMPUS (MISCELLANEOUS) ×1 IMPLANT
MANIFOLD NEPTUNE II (INSTRUMENTS) ×1 IMPLANT
SNARE LASSO HEX 3 IN 1 (INSTRUMENTS) IMPLANT
WATER STERILE IRR 250ML POUR (IV SOLUTION) ×1 IMPLANT

## 2024-06-25 NOTE — H&P (Signed)
 Lori JONELLE Brooklyn, MD [facility], [address]  [place], [place] [address]  Main: [phone] Fax:  [phone] Pager: [phone]   Primary Care Physician:  Justus Leita DEL, MD Primary Gastroenterologist:  Dr. Corinn JONELLE Trevino  Pre-Procedure History & Physical: HPI:  Lori Trevino is a 73 y.o. female is here for an colonoscopy.   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   Depression with anxiety    Dyspnea    bronchitis from allergies   Hepatitis    1980   History of hiatal hernia    History of repair of hiatal hernia    Hypertension    Hyponatremia, acute on chronic 09/13/2021   Pre-diabetes    Radiculopathy    Raynaud's disease    Sensorineural hearing loss (SNHL) of both ears    Tendonitis 11/2019   right wrist and right arm   Tinnitus     Past Surgical History:  Procedure Laterality Date   AXILLARY SURGERY Right 2008   BIOPSY  08/03/2023   Procedure: BIOPSY;  Surgeon: Wilhelmenia Aloha Raddle., MD;  Location: [facility];  Service: Gastroenterology;;   CHOLECYSTECTOMY     COLONOSCOPY WITH PROPOFOL  N/A 08/21/2023   Procedure: COLONOSCOPY WITH PROPOFOL ;  Surgeon: Jinny Carmine, MD;  Location: [facility];  Service: Endoscopy;  Laterality: N/A;  poor prep   CYSTOCELE REPAIR N/A 02/08/2016   Procedure: ANTERIOR REPAIR (CYSTOCELE);  Surgeon: Debby JINNY Dinsmore, MD;  Location: [facility];  Service: Gynecology;  Laterality: N/A;   ESOPHAGOGASTRODUODENOSCOPY N/A 08/03/2023   Procedure: ESOPHAGOGASTRODUODENOSCOPY (EGD);  Surgeon: Wilhelmenia Aloha Raddle., MD;  Location: [facility];  Service: Gastroenterology;  Laterality: N/A;   EUS N/A 08/03/2023   Procedure: UPPER ENDOSCOPIC ULTRASOUND (EUS) RADIAL;  Surgeon: Wilhelmenia Aloha Raddle., MD;  Location: [facility];  Service: Gastroenterology;  Laterality: N/A;   LAPAROSCOPIC GASTRIC SLEEVE RESECTION WITH HIATAL HERNIA  REPAIR  2015   TONSILLECTOMY     TUBAL LIGATION     VAGINAL HYSTERECTOMY N/A 02/08/2016   Procedure: HYSTERECTOMY VAGINAL WITH BSO;  Surgeon: Debby JINNY Dinsmore, MD;  Location: [facility];  Service: Gynecology;  Laterality: N/A;   VARICOSE VEIN SURGERY      Prior to Admission medications   Medication Sig Start Date End Date Taking? Authorizing Provider  methadone  (DOLOPHINE ) 1 mg/ml oral solution Take by mouth daily. 100 mg   Yes [provider]  olmesartan  (BENICAR ) 20 MG tablet Take 1 tablet (20 mg total) by mouth daily. 12/21/23  Yes Justus Leita DEL, MD  pantoprazole  (PROTONIX ) 40 MG tablet Take 1 tablet (40 mg total) by mouth 2 (two) times daily before a meal. 08/03/23 08/02/24 Yes Mansouraty, Aloha Raddle., MD  amLODipine  (NORVASC ) 5 MG tablet TAKE 1 TABLET (5 MG TOTAL) BY MOUTH DAILY. FOR HIGH BLOOD PRESSURE 09/09/19 12/18/19  Justus Leita DEL, MD    Allergies as of 06/04/2024 - Review Complete 03/04/2024  Allergen Reaction Noted   Neurontin [gabapentin] Swelling 06/30/2017    Family History  Problem Relation Age of Onset   Stroke Mother    Cancer Father    Breast cancer Neg Hx     Social History   Socioeconomic History   Marital status: Divorced    Spouse name: N/A   Number of children: 2   Years of education: 12   Highest education level: Some college, no degree  Occupational History  Occupation: retired  Tobacco Use   Smoking status: Some Days    Current packs/day: 0.25    Average packs/day: 0.3 packs/day for 57.7 years (14.4 ttl pk-yrs)    Types: Cigarettes    Start date: 1968   Smokeless tobacco: Never   Tobacco comments:    2-3 daily  Vaping Use   Vaping status: Never Used  Substance and Sexual Activity   Alcohol use: Not Currently    Alcohol/week: 0.0 standard drinks of alcohol   Drug use: Yes    Types: Marijuana, LSD, Cocaine, Methamphetamines, Amphetamines    Comment: past 72-59 years old   Sexual activity: Yes  Other Topics Concern   Not  on file  Social History Narrative   Not on file   Social Drivers of Health   Financial Resource Strain: Low Risk  (08/24/2023)   Overall Financial Resource Strain (CARDIA)    Difficulty of Paying Living Expenses: Not hard at all  Food Insecurity: No Food Insecurity (08/24/2023)   Hunger Vital Sign    Worried About Running Out of Food in the Last Year: Never true    Ran Out of Food in the Last Year: Never true  Transportation Needs: No Transportation Needs (08/24/2023)   PRAPARE - Administrator, Civil Service (Medical): No    Lack of Transportation (Non-Medical): No  Physical Activity: Inactive (08/24/2023)   Exercise Vital Sign    Days of Exercise per Week: 0 days    Minutes of Exercise per Session: 0 min  Stress: No Stress Concern Present (08/24/2023)   [employer] of Occupational Health - Occupational Stress Questionnaire    Feeling of Stress : Not at all  Social Connections: Socially Isolated (08/24/2023)   Social Connection and Isolation Panel    Frequency of Communication with Friends and Family: More than three times a week    Frequency of Social Gatherings with Friends and Family: More than three times a week    Attends Religious Services: Never    Database administrator or Organizations: No    Attends Banker Meetings: Never    Marital Status: Divorced  Catering manager Violence: Not At Risk (08/24/2023)   Humiliation, Afraid, Rape, and Kick questionnaire    Fear of Current or Ex-Partner: No    Emotionally Abused: No    Physically Abused: No    Sexually Abused: No    Review of Systems: See HPI, otherwise negative ROS  Physical Exam: BP (!) 162/88   Pulse 85   Temp 98.2 F (36.8 C) (Temporal)   Resp 12   Ht 5' 2 (1.575 m)   Wt 80 kg   SpO2 98%   BMI 32.26 kg/m  General:   Alert,  pleasant and cooperative in NAD Head:  Normocephalic and atraumatic. Neck:  Supple; no masses or thyromegaly. Lungs:  Clear throughout to  auscultation.    Heart:  Regular rate and rhythm. Abdomen:  Soft, nontender and nondistended. Normal bowel sounds, without guarding, and without rebound.   Neurologic:  Alert and  oriented x4;  grossly normal neurologically.  Impression/Plan: Lori Trevino is here for an colonoscopy to be performed for right sided colon wall thickening and history of iron deficiency anemia   Risks, benefits, limitations, and alternatives regarding  colonoscopy have been reviewed with the patient.  Questions have been answered.  All parties agreeable.   Lori Brooklyn, MD  06/25/2024, 7:46 AM

## 2024-06-25 NOTE — Anesthesia Preprocedure Evaluation (Signed)
 Anesthesia Evaluation  Patient identified by MRN, date of birth, ID band Patient awake    Reviewed: Allergy & Precautions, NPO status , Patient's Chart, lab work & pertinent test results  History of Anesthesia Complications Negative for: history of anesthetic complications  Airway Mallampati: III  TM Distance: <3 FB Neck ROM: full    Dental  (+) Chipped, Lower Dentures, Missing   Pulmonary shortness of breath and with exertion, COPD, Current Smoker and Patient abstained from smoking.   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 No date: Depression with anxiety No date: Dyspnea     Comment:  bronchitis from allergies No date: Hepatitis     Comment:  1980 No date: History of hiatal hernia No date: History of repair of hiatal hernia No date: Hypertension 09/13/2021: Hyponatremia, acute on chronic No date: Pre-diabetes No date: Radiculopathy No date: Raynaud's disease No date: Sensorineural hearing loss (SNHL) of both ears 11/2019: Tendonitis     Comment:  right wrist and right arm No date: Tinnitus  Past Surgical History: 2008: AXILLARY SURGERY; Right 08/03/2023: BIOPSY     Comment:  Procedure: BIOPSY;  Surgeon: Wilhelmenia Aloha Raddle.,               MD;  Location: [facility];  Service: Gastroenterology;; No date: CHOLECYSTECTOMY 08/21/2023: COLONOSCOPY WITH PROPOFOL ; N/A     Comment:  Procedure:  COLONOSCOPY WITH PROPOFOL ;  Surgeon: Jinny Carmine, MD;  Location: [facility];  Service:               Endoscopy;  Laterality: N/A;  poor prep 02/08/2016: CYSTOCELE REPAIR; N/A     Comment:  Procedure: ANTERIOR REPAIR (CYSTOCELE);  Surgeon: Debby JINNY Dinsmore, MD;  Location: [facility];  Service:               Gynecology;  Laterality: N/A; 08/03/2023: ESOPHAGOGASTRODUODENOSCOPY; N/A     Comment:  Procedure: ESOPHAGOGASTRODUODENOSCOPY (EGD);  Surgeon:               Wilhelmenia Aloha Raddle., MD;  Location: [facility];                Service: Gastroenterology;  Laterality: N/A; 08/03/2023: EUS; N/A     Comment:  Procedure: UPPER ENDOSCOPIC ULTRASOUND (EUS) RADIAL;                Surgeon: Wilhelmenia Aloha Raddle., MD;  Location: [facility];  Service: Gastroenterology;  Laterality: N/A; 2015: LAPAROSCOPIC GASTRIC SLEEVE RESECTION WITH HIATAL HERNIA REPAIR No date: TONSILLECTOMY No date: TUBAL LIGATION 02/08/2016: VAGINAL HYSTERECTOMY; N/A     Comment:  Procedure: HYSTERECTOMY VAGINAL WITH BSO;  Surgeon:               Debby JINNY Schermerhorn,  MD;  Location: [facility];  Service:              Gynecology;  Laterality: N/A; No date: VARICOSE VEIN SURGERY  BMI    Body Mass Index: 32.26 kg/m      Reproductive/Obstetrics negative OB ROS                              Anesthesia Physical Anesthesia Plan  ASA: 2  Anesthesia Plan: General   Post-op Pain Management:    Induction: Intravenous  PONV Risk Score and Plan: Propofol  infusion and TIVA  Airway Management Planned: Natural Airway and Nasal Cannula  Additional Equipment:   Intra-op Plan:   Post-operative Plan:   Informed Consent: I have reviewed the patients History and Physical, chart, labs and discussed the procedure including the risks, benefits and alternatives for the proposed anesthesia with the patient or authorized representative who has indicated  his/her understanding and acceptance.     Dental Advisory Given  Plan Discussed with: Anesthesiologist, CRNA and Surgeon  Anesthesia Plan Comments: (Patient consented for risks of anesthesia including but not limited to:  - adverse reactions to medications - risk of airway placement if required - damage to eyes, teeth, lips or other oral mucosa - nerve damage due to positioning  - sore throat or hoarseness - Damage to heart, brain, nerves, lungs, other parts of body or loss of life  Patient voiced understanding and assent.)        Anesthesia Quick Evaluation

## 2024-06-25 NOTE — Op Note (Addendum)
 [facility] Gastroenterology Patient Name: Lori Trevino Procedure Date: 06/25/2024 7:12 AM MRN: [id number] Account #: [id number] Date of Birth: [birth year] Admit Type: Outpatient Age: 73 Room: [facility] OR ROOM 01 Gender: Female Note Status: Supervisor Override Instrument Name: Arvis [id number] Procedure:             Colonoscopy Indications:           Last colonoscopy: November 2024, Unexplained iron                         deficiency anemia, Abnormal CT of the GI tract, colon                         wall thickening Providers:             Corinn Jess Brooklyn MD, MD Referring MD:          Leita Adie, MD (Referring MD) Medicines:             General Anesthesia Complications:         No immediate complications. Estimated blood loss: None. Procedure:             Pre-Anesthesia Assessment:                        - Prior to the procedure, a History and Physical was                         performed, and patient medications and allergies were                         reviewed. The patient is competent. The risks and                         benefits of the procedure and the sedation options and                         risks were discussed with the patient. All questions                         were answered and informed consent was obtained.                         Patient identification and proposed procedure were                         verified by the physician, the nurse, the                         anesthesiologist, the anesthetist and the technician                         in the pre-procedure area in the procedure room in the                         endoscopy suite. Mental Status Examination: alert and                         oriented. Airway Examination: normal oropharyngeal  airway and neck mobility. Respiratory Examination:                         clear to auscultation. CV Examination: normal.                         Prophylactic  Antibiotics: The patient does not require                         prophylactic antibiotics. Prior Anticoagulants: The                         patient has taken no anticoagulant or antiplatelet                         agents. ASA Grade Assessment: II - A patient with mild                         systemic disease. After reviewing the risks and                         benefits, the patient was deemed in satisfactory                         condition to undergo the procedure. The anesthesia                         plan was to use general anesthesia. Immediately prior                         to administration of medications, the patient was                         re-assessed for adequacy to receive sedatives. The                         heart rate, respiratory rate, oxygen saturations,                         blood pressure, adequacy of pulmonary ventilation, and                         response to care were monitored throughout the                         procedure. The physical status of the patient was                         re-assessed after the procedure.                        After obtaining informed consent, the colonoscope was                         passed under direct vision. Throughout the procedure,                         the patient's blood pressure, pulse, and oxygen  saturations were monitored continuously. The                         Colonoscope was introduced through the anus with the                         intention of advancing to the cecum. The scope was                         advanced to the transverse colon before the procedure                         was aborted. Medications were not given. The                         colonoscopy was extremely difficult due to inadequate                         bowel prep and poor endoscopic visualization. The                         patient tolerated the procedure well. The quality of                          the bowel preparation was poor. The colonoscopy was                         aborted due to the difficulty of the procedure. Findings:      The perianal and digital rectal examinations were normal. Pertinent       negatives include normal sphincter tone and no palpable rectal lesions.      Two sessile polyps were found in the sigmoid colon. The polyps were 9 to       10 mm in size. These polyps were removed with a hot snare. Resection and       retrieval were complete. To prevent bleeding after the polypectomy, one       hemostatic clip was successfully placed (MR safe). Clip manufacturer:       [employer]. There was no bleeding at the end of the procedure.      Small-mouthed diverticula were found in the recto-sigmoid colon and       sigmoid colon.      The retroflexed view of the distal rectum and anal verge was normal and       showed no anal or rectal abnormalities.      Extensive amounts of copious quantities of semi-liquid stool was found       in the entire colon, precluding visualization. Impression:            - Preparation of the colon was poor.                        - The procedure was aborted due to the difficulty of                         the procedure.                        - Two 9 to 10 mm polyps in the  sigmoid colon, removed                         with a hot snare. Resected and retrieved. Clip                         manufacturer: [employer]. Clip (MR safe) was                         placed.                        - Diverticulosis in the recto-sigmoid colon and in the                         sigmoid colon.                        - The distal rectum and anal verge are normal on                         retroflexion view.                        - Stool in the entire examined colon. Recommendation:        - Discharge patient to home (with escort).                        - Resume previous diet today.                        - Continue present medications.                         - Await pathology results.                        - Repeat colonoscopy tomorrow with repeat prep if pt                         is agreeable otherwise, repeat with 2 day prep in 3                         months because the bowel preparation was suboptimal. Procedure Code(s):     --- Professional ---                        [phone], 52, Colonoscopy, flexible; with removal of                         tumor(s), polyp(s), or other lesion(s) by snare                         technique CPT copyright 2022 American Medical Association. All rights reserved. The codes documented in this report are preliminary and upon coder review may  be revised to meet current compliance requirements. Dr. Corinn Brooklyn Corinn Jess Brooklyn MD, MD 06/25/2024 8:23:59 AM This report has been signed electronically. Number of Addenda: 0 Note Initiated On: 06/25/2024 7:12 AM Scope Withdrawal Time: 0 hours 0 minutes 2 seconds  Total Procedure Duration: 0 hours  19 minutes 10 seconds  Estimated Blood Loss:  Estimated blood loss: none.      [facility]

## 2024-06-25 NOTE — Transfer of Care (Signed)
 Immediate Anesthesia Transfer of Care Note  Patient: Lori Trevino  Procedure(s) Performed: COLONOSCOPY  Patient Location: PACU  Anesthesia Type:MAC  Level of Consciousness: awake  Airway & Oxygen Therapy: Patient Spontanous Breathing  Post-op Assessment: Report given to RN  Post vital signs: Reviewed and stable  Last Vitals:  Vitals Value Taken Time  BP 158/109 06/25/24 08:24  Temp    Pulse 81 06/25/24 08:25  Resp 14 06/25/24 08:25  SpO2 99 % 06/25/24 08:25  Vitals shown include unfiled device data.  Last Pain:  Vitals:   06/25/24 0706  TempSrc: Temporal  PainSc: 0-No pain         Complications: No notable events documented.

## 2024-06-25 NOTE — Telephone Encounter (Signed)
 Patient stated she will call the office to set up medication refill appt.

## 2024-06-25 NOTE — Anesthesia Postprocedure Evaluation (Signed)
 Anesthesia Post Note  Patient: Lori Trevino  Procedure(s) Performed: COLONOSCOPY POLYPECTOMY, INTESTINE  Patient location during evaluation: Endoscopy Anesthesia Type: General Level of consciousness: awake and alert Pain management: pain level controlled Vital Signs Assessment: post-procedure vital signs reviewed and stable Respiratory status: spontaneous breathing, nonlabored ventilation and respiratory function stable Cardiovascular status: blood pressure returned to baseline and stable Postop Assessment: no apparent nausea or vomiting Anesthetic complications: no   No notable events documented.   Last Vitals:  Vitals:   06/25/24 0830 06/25/24 0837  BP: (!) 158/101   Pulse: 83   Resp: 17 12  Temp:    SpO2: 100%     Last Pain:  Vitals:   06/25/24 0830  TempSrc:   PainSc: 2                  Fairy POUR Sumire Halbleib

## 2024-06-25 NOTE — Telephone Encounter (Signed)
 Please call pt to schedule a appointment.  KP

## 2024-06-26 ENCOUNTER — Encounter
Admission: RE | Disposition: A | Discharge status: Home / Self Care | Payer: Self-pay | Source: Home / Self Care | Attending: Gastroenterology

## 2024-06-26 ENCOUNTER — Encounter: Payer: Self-pay | Admitting: Gastroenterology

## 2024-06-26 ENCOUNTER — Ambulatory Visit: Admitting: Anesthesiology

## 2024-06-26 ENCOUNTER — Ambulatory Visit
Admission: RE | Admit: 2024-06-26 | Discharge: 2024-06-26 | Disposition: A | Discharge status: Home / Self Care | Attending: Gastroenterology | Admitting: Gastroenterology

## 2024-06-26 DIAGNOSIS — R933 Abnormal findings on diagnostic imaging of other parts of digestive tract: Secondary | ICD-10-CM | POA: Diagnosis present

## 2024-06-26 DIAGNOSIS — J449 Chronic obstructive pulmonary disease, unspecified: Secondary | ICD-10-CM | POA: Diagnosis not present

## 2024-06-26 DIAGNOSIS — D509 Iron deficiency anemia, unspecified: Secondary | ICD-10-CM | POA: Diagnosis not present

## 2024-06-26 DIAGNOSIS — Q438 Other specified congenital malformations of intestine: Secondary | ICD-10-CM | POA: Insufficient documentation

## 2024-06-26 DIAGNOSIS — F17210 Nicotine dependence, cigarettes, uncomplicated: Secondary | ICD-10-CM | POA: Insufficient documentation

## 2024-06-26 DIAGNOSIS — Z79899 Other long term (current) drug therapy: Secondary | ICD-10-CM | POA: Insufficient documentation

## 2024-06-26 DIAGNOSIS — I10 Essential (primary) hypertension: Secondary | ICD-10-CM | POA: Insufficient documentation

## 2024-06-26 DIAGNOSIS — K219 Gastro-esophageal reflux disease without esophagitis: Secondary | ICD-10-CM | POA: Diagnosis not present

## 2024-06-26 DIAGNOSIS — Z8619 Personal history of other infectious and parasitic diseases: Secondary | ICD-10-CM | POA: Insufficient documentation

## 2024-06-26 DIAGNOSIS — Z8711 Personal history of peptic ulcer disease: Secondary | ICD-10-CM | POA: Insufficient documentation

## 2024-06-26 HISTORY — PX: COLONOSCOPY: SHX5424

## 2024-06-26 LAB — SURGICAL PATHOLOGY

## 2024-06-26 SURGERY — COLONOSCOPY
Anesthesia: General

## 2024-06-26 MED ORDER — SODIUM CHLORIDE 0.9 % IV SOLN
INTRAVENOUS | Status: DC
  Administered 2024-06-26: 500 mL via INTRAVENOUS

## 2024-06-26 MED ORDER — DEXMEDETOMIDINE HCL IN NACL 80 MCG/20ML IV SOLN
INTRAVENOUS | Status: DC | PRN
  Administered 2024-06-26: 8 ug via INTRAVENOUS
  Administered 2024-06-26: 12 ug via INTRAVENOUS

## 2024-06-26 MED ORDER — LIDOCAINE HCL (CARDIAC) PF 100 MG/5ML IV SOSY
PREFILLED_SYRINGE | INTRAVENOUS | Status: DC | PRN
  Administered 2024-06-26: 60 mg via INTRAVENOUS

## 2024-06-26 MED ORDER — LIDOCAINE HCL (PF) 2 % IJ SOLN
INTRAMUSCULAR | Status: AC
  Filled 2024-06-26: qty 5

## 2024-06-26 MED ORDER — PROPOFOL 10 MG/ML IV BOLUS
INTRAVENOUS | Status: DC | PRN
Start: 2024-06-26 — End: 2024-06-26
  Administered 2024-06-26 (×4): 50 mg via INTRAVENOUS

## 2024-06-26 MED ORDER — PROPOFOL 500 MG/50ML IV EMUL
INTRAVENOUS | Status: DC | PRN
  Administered 2024-06-26: 75 ug/kg/min via INTRAVENOUS

## 2024-06-26 NOTE — Transfer of Care (Signed)
 Immediate Anesthesia Transfer of Care Note  Patient: Lori Trevino  Procedure(s) Performed: COLONOSCOPY  Patient Location: PACU  Anesthesia Type:General  Level of Consciousness: sedated  Airway & Oxygen Therapy: Patient Spontanous Breathing and Patient connected to nasal cannula oxygen  Post-op Assessment: Report given to RN and Post -op Vital signs reviewed and stable  Post vital signs: Reviewed and stable  Last Vitals:  Vitals Value Taken Time  BP 96/62 06/26/24 12:39  Temp    Pulse 70 06/26/24 12:39  Resp    SpO2 98 % 06/26/24 12:39  Vitals shown include unfiled device data.  Last Pain:  Vitals:   06/26/24 1058  TempSrc: Temporal  PainSc: 0-No pain         Complications: No notable events documented.

## 2024-06-26 NOTE — H&P (Signed)
 Corinn JONELLE Brooklyn, MD [facility] Gastroenterology, [facility] [address]  [place], [place] [address]  Main: [phone] Fax:  [phone] Pager: [phone]   Primary Care Physician:  Justus Leita DEL, MD Primary Gastroenterologist:  Dr. Corinn JONELLE Brooklyn  Pre-Procedure History & Physical: HPI:  Albertina Leise is a 73 y.o. female is here for an colonoscopy.   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   Depression with anxiety    Dyspnea    bronchitis from allergies   Hepatitis    1980   History of hiatal hernia    History of repair of hiatal hernia    Hypertension    Hyponatremia, acute on chronic 09/13/2021   Pre-diabetes    Radiculopathy    Raynaud's disease    Sensorineural hearing loss (SNHL) of both ears    Tendonitis 11/2019   right wrist and right arm   Tinnitus     Past Surgical History:  Procedure Laterality Date   AXILLARY SURGERY Right 2008   BIOPSY  08/03/2023   Procedure: BIOPSY;  Surgeon: Wilhelmenia Aloha Raddle., MD;  Location: [facility];  Service: Gastroenterology;;   CHOLECYSTECTOMY     COLONOSCOPY N/A 06/25/2024   Procedure: COLONOSCOPY;  Surgeon: Brooklyn Corinn Skiff, MD;  Location: [facility];  Service: Endoscopy;  Laterality: N/A;   COLONOSCOPY WITH PROPOFOL  N/A 08/21/2023   Procedure: COLONOSCOPY WITH PROPOFOL ;  Surgeon: Jinny Carmine, MD;  Location: [facility];  Service: Endoscopy;  Laterality: N/A;  poor prep   CYSTOCELE REPAIR N/A 02/08/2016   Procedure: ANTERIOR REPAIR (CYSTOCELE);  Surgeon: Debby JINNY Dinsmore, MD;  Location: [facility];  Service: Gynecology;  Laterality: N/A;   ESOPHAGOGASTRODUODENOSCOPY N/A 08/03/2023   Procedure: ESOPHAGOGASTRODUODENOSCOPY (EGD);  Surgeon: Wilhelmenia Aloha Raddle., MD;  Location: [facility];  Service: Gastroenterology;  Laterality: N/A;   EUS N/A 08/03/2023   Procedure: UPPER ENDOSCOPIC ULTRASOUND (EUS) RADIAL;   Surgeon: Wilhelmenia Aloha Raddle., MD;  Location: [facility];  Service: Gastroenterology;  Laterality: N/A;   LAPAROSCOPIC GASTRIC SLEEVE RESECTION WITH HIATAL HERNIA REPAIR  2015   POLYPECTOMY N/A 06/25/2024   Procedure: POLYPECTOMY, INTESTINE;  Surgeon: Brooklyn Corinn Skiff, MD;  Location: [facility];  Service: Endoscopy;  Laterality: N/A;   TONSILLECTOMY     TUBAL LIGATION     VAGINAL HYSTERECTOMY N/A 02/08/2016   Procedure: HYSTERECTOMY VAGINAL WITH BSO;  Surgeon: Debby JINNY Dinsmore, MD;  Location: [facility];  Service: Gynecology;  Laterality: N/A;   VARICOSE VEIN SURGERY      Prior to Admission medications   Medication Sig Start Date End Date Taking? Authorizing Provider  methadone  (DOLOPHINE ) 1 mg/ml oral solution Take by mouth daily. 100 mg   Yes [provider]  olmesartan  (BENICAR ) 20 MG tablet TAKE 1 TABLET BY MOUTH EVERY DAY 06/25/24  Yes Justus Leita DEL, MD  pantoprazole  (PROTONIX ) 40 MG tablet Take 1 tablet (40 mg total) by mouth 2 (two) times daily before a meal. 08/03/23 08/02/24 Yes Mansouraty, Aloha Raddle., MD  amLODipine  (NORVASC ) 5 MG tablet TAKE 1 TABLET (5 MG TOTAL) BY MOUTH DAILY. FOR HIGH BLOOD PRESSURE 09/09/19 12/18/19  Justus Leita DEL, MD    Allergies as of 06/25/2024 - Review Complete 06/25/2024  Allergen Reaction Noted   Neurontin [gabapentin] Swelling 06/30/2017    Family History  Problem Relation Age of Onset   Stroke Mother    Cancer Father  Breast cancer Neg Hx     Social History   Socioeconomic History   Marital status: Divorced    Spouse name: N/A   Number of children: 2   Years of education: 12   Highest education level: Some college, no degree  Occupational History   Occupation: retired  Tobacco Use   Smoking status: Some Days    Current packs/day: 0.25    Average packs/day: 0.3 packs/day for 57.7 years (14.4 ttl pk-yrs)    Types: Cigarettes    Start date: 1968   Smokeless tobacco: Never   Tobacco comments:    2-3  daily  Vaping Use   Vaping status: Never Used  Substance and Sexual Activity   Alcohol use: Not Currently    Alcohol/week: 0.0 standard drinks of alcohol   Drug use: Yes    Types: Marijuana, LSD, Cocaine, Methamphetamines, Amphetamines    Comment: none 2 weeks   Sexual activity: Yes  Other Topics Concern   Not on file  Social History Narrative   Not on file   Social Drivers of Health   Financial Resource Strain: Low Risk  (08/24/2023)   Overall Financial Resource Strain (CARDIA)    Difficulty of Paying Living Expenses: Not hard at all  Food Insecurity: No Food Insecurity (08/24/2023)   Hunger Vital Sign    Worried About Running Out of Food in the Last Year: Never true    Ran Out of Food in the Last Year: Never true  Transportation Needs: No Transportation Needs (08/24/2023)   PRAPARE - Administrator, Civil Service (Medical): No    Lack of Transportation (Non-Medical): No  Physical Activity: Inactive (08/24/2023)   Exercise Vital Sign    Days of Exercise per Week: 0 days    Minutes of Exercise per Session: 0 min  Stress: No Stress Concern Present (08/24/2023)   [employer] of Occupational Health - Occupational Stress Questionnaire    Feeling of Stress : Not at all  Social Connections: Socially Isolated (08/24/2023)   Social Connection and Isolation Panel    Frequency of Communication with Friends and Family: More than three times a week    Frequency of Social Gatherings with Friends and Family: More than three times a week    Attends Religious Services: Never    Database administrator or Organizations: No    Attends Banker Meetings: Never    Marital Status: Divorced  Catering manager Violence: Not At Risk (08/24/2023)   Humiliation, Afraid, Rape, and Kick questionnaire    Fear of Current or Ex-Partner: No    Emotionally Abused: No    Physically Abused: No    Sexually Abused: No    Review of Systems: See HPI, otherwise negative  ROS  Physical Exam: BP (!) 166/91   Pulse 86   Temp (!) 97.2 F (36.2 C) (Temporal)   Resp 20   Ht 5' 3 (1.6 m)   Wt 79.3 kg   SpO2 100%   BMI 30.96 kg/m  General:   Alert,  pleasant and cooperative in NAD Head:  Normocephalic and atraumatic. Neck:  Supple; no masses or thyromegaly. Lungs:  Clear throughout to auscultation.    Heart:  Regular rate and rhythm. Abdomen:  Soft, nontender and nondistended. Normal bowel sounds, without guarding, and without rebound.   Neurologic:  Alert and  oriented x4;  grossly normal neurologically.  Impression/Plan: Lori Trevino is here for an colonoscopy to be performed for right sided colon wall thickening  and history of iron deficiency anemia   Risks, benefits, limitations, and alternatives regarding  colonoscopy have been reviewed with the patient.  Questions have been answered.  All parties agreeable.   Corinn Brooklyn, MD  06/26/2024, 11:48 AM

## 2024-06-26 NOTE — Op Note (Addendum)
 [facility] Gastroenterology Patient Name: Lori Trevino Procedure Date: 06/26/2024 11:50 AM MRN: [id number] Account #: [id number] Date of Birth: Mar 10, 1951 Admit Type: Outpatient Age: 73 Room: [facility] ENDO ROOM 4 Gender: Female Note Status: Supervisor Override Instrument Name: Colon Scope [id number] Procedure:             Colonoscopy Indications:           Last colonoscopy: September 2025, Abnormal CT of the                         GI tract, colon wall thickening Providers:             Corinn Jess Brooklyn MD, MD Referring MD:          Leita Adie, MD (Referring MD) Medicines:             General Anesthesia Complications:         No immediate complications. Estimated blood loss: None. Procedure:             Pre-Anesthesia Assessment:                        - Prior to the procedure, a History and Physical was                         performed, and patient medications and allergies were                         reviewed. The patient is competent. The risks and                         benefits of the procedure and the sedation options and                         risks were discussed with the patient. All questions                         were answered and informed consent was obtained.                         Patient identification and proposed procedure were                         verified by the physician, the nurse, the                         anesthesiologist, the anesthetist and the technician                         in the pre-procedure area in the procedure room in the                         endoscopy suite. Mental Status Examination: alert and                         oriented. Airway Examination: normal oropharyngeal                         airway and neck mobility. Respiratory Examination:  clear to auscultation. CV Examination: normal.                         Prophylactic Antibiotics: The patient does not require                          prophylactic antibiotics. Prior Anticoagulants: The                         patient has taken no anticoagulant or antiplatelet                         agents. ASA Grade Assessment: III - A patient with                         severe systemic disease. After reviewing the risks and                         benefits, the patient was deemed in satisfactory                         condition to undergo the procedure. The anesthesia                         plan was to use general anesthesia. Immediately prior                         to administration of medications, the patient was                         re-assessed for adequacy to receive sedatives. The                         heart rate, respiratory rate, oxygen saturations,                         blood pressure, adequacy of pulmonary ventilation, and                         response to care were monitored throughout the                         procedure. The physical status of the patient was                         re-assessed after the procedure.                        After obtaining informed consent, the colonoscope was                         passed under direct vision. Throughout the procedure,                         the patient's blood pressure, pulse, and oxygen                         saturations were monitored continuously. The  Colonoscope was introduced through the anus and                         advanced to the the cecum, identified by appendiceal                         orifice and ileocecal valve. The colonoscopy was                         extremely difficult due to significant looping, a                         tortuous colon and the patient's body habitus.                         Successful completion of the procedure was aided by                         changing the patient to a prone position and applying                         abdominal pressure. The patient tolerated the                          procedure well. The quality of the bowel preparation                         was adequate to identify polyps greater than 5 mm in                         size. The ileocecal valve, appendiceal orifice, and                         rectum were photographed. Findings:      The perianal and digital rectal examinations were normal. Pertinent       negatives include normal sphincter tone and no palpable rectal lesions.      The colon (entire examined portion) appeared normal.      The retroflexed view of the distal rectum and anal verge was normal and       showed no anal or rectal abnormalities. Impression:            - The entire examined colon is normal.                        - The distal rectum and anal verge are normal on                         retroflexion view.                        - No specimens collected. Recommendation:        - Discharge patient to home (with escort).                        - Resume previous diet today.                        - Continue present medications.                        -  Repeat colonoscopy in 10 years for screening                         purposes. Procedure Code(s):     --- Professional ---                        [phone], Colonoscopy, flexible; diagnostic, including                         collection of specimen(s) by brushing or washing, when                         performed (separate procedure) Diagnosis Code(s):     --- Professional ---                        R93.3, Abnormal findings on diagnostic imaging of                         other parts of digestive tract CPT copyright 2022 American Medical Association. All rights reserved. The codes documented in this report are preliminary and upon coder review may  be revised to meet current compliance requirements. Dr. Corinn Brooklyn Corinn Jess Brooklyn MD, MD 06/26/2024 12:37:54 PM This report has been signed electronically. Number of Addenda: 0 Note Initiated On: 06/26/2024 11:50 AM Scope Withdrawal  Time: 0 hours 11 minutes 57 seconds  Total Procedure Duration: 0 hours 30 minutes 0 seconds  Estimated Blood Loss:  Estimated blood loss: none.      [facility]

## 2024-07-01 NOTE — Anesthesia Postprocedure Evaluation (Signed)
 Anesthesia Post Note  Patient: Lori Trevino  Procedure(s) Performed: COLONOSCOPY  Patient location during evaluation: PACU Anesthesia Type: General Level of consciousness: awake and alert Pain management: pain level controlled Vital Signs Assessment: post-procedure vital signs reviewed and stable Respiratory status: spontaneous breathing, nonlabored ventilation, respiratory function stable and patient connected to nasal cannula oxygen Cardiovascular status: blood pressure returned to baseline and stable Postop Assessment: no apparent nausea or vomiting Anesthetic complications: no   No notable events documented.   Last Vitals:  Vitals:   06/26/24 1249 06/26/24 1259  BP:  135/76  Pulse: 70 68  Resp: 20 18  Temp:    SpO2: 99% 98%    Last Pain:  Vitals:   06/27/24 0722  TempSrc:   PainSc: 0-No pain                 Lynwood KANDICE Clause

## 2024-07-01 NOTE — Anesthesia Preprocedure Evaluation (Signed)
 Anesthesia Evaluation  Patient identified by MRN, date of birth, ID band Patient awake    Reviewed: Allergy & Precautions, H&P , NPO status , Patient's Chart, lab work & pertinent test results, reviewed documented beta blocker date and time   Airway Mallampati: II   Neck ROM: full    Dental  (+) Poor Dentition   Pulmonary shortness of breath and with exertion, COPD, Current Smoker and Patient abstained from smoking.   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 No date: Depression with anxiety No date: Dyspnea     Comment:  bronchitis from allergies No date: Hepatitis     Comment:  1980 No date: History of hiatal hernia No date: History of repair of hiatal hernia No date: Hypertension 09/13/2021: Hyponatremia, acute on chronic No date: Pre-diabetes No date: Radiculopathy No date: Raynaud's disease No date: Sensorineural hearing loss (SNHL) of both ears 11/2019: Tendonitis     Comment:  right wrist and right arm No date: Tinnitus Past Surgical History: 2008: AXILLARY SURGERY; Right 08/03/2023: BIOPSY     Comment:  Procedure: BIOPSY;  Surgeon: Wilhelmenia Aloha Raddle.,               MD;  Location: [facility] END[facility];  Service: Gastroenterology;; No date: CHOLECYSTECTOMY 06/25/2024: COLONOSCOPY; N/A     Comment:  Procedure: COLONOSCOPY;  Surgeon: Unk Corinn Skiff,                MD;  Location: [facility];  Service: Endoscopy;               Laterality: N/A; 06/26/2024: COLONOSCOPY; N/A     Comment:  Procedure: COLONOSCOPY;  Surgeon: Unk Corinn Skiff,               MD;  Location: [facility] ENDOSCOPY;  Service:               Gastroenterology;  Laterality: N/A; 08/21/2023: COLONOSCOPY WITH PROPOFOL ; N/A     Comment:  Procedure: COLONOSCOPY WITH PROPOFOL ;  Surgeon: Jinny Carmine, MD;  Location: [facility];  Service:               Endoscopy;  Laterality: N/A;  poor prep 02/08/2016: CYSTOCELE REPAIR; N/A     Comment:  Procedure: ANTERIOR REPAIR (CYSTOCELE);  Surgeon: Debby JINNY Dinsmore, MD;  Location: [facility] ORS;  Service:               Gynecology;  Laterality: N/A; 08/03/2023: ESOPHAGOGASTRODUODENOSCOPY; N/A     Comment:  Procedure: ESOPHAGOGASTRODUODENOSCOPY (EGD);  Surgeon:               Wilhelmenia Aloha Raddle., MD;  Location: [facility];                Service: Gastroenterology;  Laterality: N/A; 08/03/2023:  EUS; N/A     Comment:  Procedure: UPPER ENDOSCOPIC ULTRASOUND (EUS) RADIAL;                Surgeon: Wilhelmenia Aloha Raddle., MD;  Location: [facility]               ENDOSCOPY;  Service: Gastroenterology;  Laterality: N/A; 2015: LAPAROSCOPIC GASTRIC SLEEVE RESECTION WITH HIATAL HERNIA REPAIR 06/25/2024: POLYPECTOMY; N/A     Comment:  Procedure: POLYPECTOMY, INTESTINE;  Surgeon: Unk Corinn Skiff, MD;  Location: [facility];                Service: Endoscopy;  Laterality: N/A; No date: TONSILLECTOMY No date: TUBAL LIGATION 02/08/2016: VAGINAL HYSTERECTOMY; N/A     Comment:  Procedure: HYSTERECTOMY VAGINAL WITH BSO;  Surgeon:               Debby JINNY Dinsmore, MD;  Location: [facility] ORS;  Service:              Gynecology;  Laterality: N/A; No date: VARICOSE VEIN SURGERY BMI    Body Mass Index: 30.96 kg/m     Reproductive/Obstetrics negative OB ROS                               Anesthesia Physical Anesthesia Plan  ASA: 3  Anesthesia Plan: General   Post-op Pain Management:    Induction:   PONV Risk Score and Plan:   Airway Management Planned:   Additional Equipment:   Intra-op Plan:   Post-operative Plan:   Informed Consent: I have reviewed the patients History and Physical, chart, labs and discussed the procedure including the risks, benefits and alternatives for the proposed anesthesia with the patient or authorized representative who has indicated his/her understanding and acceptance.     Dental Advisory Given  Plan Discussed with: CRNA  Anesthesia Plan Comments:         Anesthesia Quick Evaluation

## 2024-07-04 ENCOUNTER — Ambulatory Visit: Payer: Self-pay | Admitting: Gastroenterology

## 2024-08-05 ENCOUNTER — Ambulatory Visit: Admitting: Internal Medicine

## 2024-08-06 ENCOUNTER — Ambulatory Visit: Admitting: Internal Medicine

## 2024-08-13 ENCOUNTER — Ambulatory Visit: Admitting: Internal Medicine

## 2024-08-16 ENCOUNTER — Ambulatory Visit: Admitting: Internal Medicine

## 2024-08-23 ENCOUNTER — Ambulatory Visit: Admitting: Internal Medicine

## 2024-09-11 ENCOUNTER — Ambulatory Visit

## 2024-09-12 ENCOUNTER — Ambulatory Visit: Admitting: Emergency Medicine

## 2024-09-12 VITALS — Ht 61.0 in | Wt 170.0 lb

## 2024-09-12 DIAGNOSIS — Z0000 Encounter for general adult medical examination without abnormal findings: Secondary | ICD-10-CM

## 2024-09-12 NOTE — Progress Notes (Signed)
 Chief Complaint  Patient presents with   Medicare Wellness     Subjective:   Lori Trevino is a 73 y.o. female who presents for a Medicare Annual Wellness Visit.  Visit info / Clinical Intake: Medicare Wellness Visit Type:: Subsequent Annual Wellness Visit Persons participating in visit and providing information:: patient Medicare Wellness Visit Mode:: Telephone If telephone:: video declined Since this visit was completed virtually, some vitals may be partially provided or unavailable. Missing vitals are due to the limitations of the virtual format.: Documented vitals are patient reported If Telephone or Video please confirm:: I connected with patient using audio/video enable telemedicine. I verified patient identity with two identifiers, discussed telehealth limitations, and patient agreed to proceed. Patient Location:: home Provider Location:: home office Interpreter Needed?: No Pre-visit prep was completed: yes AWV questionnaire completed by patient prior to visit?: no Living arrangements:: with family/others (friend) Patient's Overall Health Status Rating: good Typical amount of pain: some Does pain affect daily life?: (!) yes Are you currently prescribed opioids?: no  Dietary Habits and Nutritional Risks How many meals a day?: 2 Eats fruit and vegetables daily?: (!) no Most meals are obtained by: preparing own meals In the last 2 weeks, have you had any of the following?: (!) nausea, vomiting, diarrhea (nausea) Diabetic:: no  Functional Status Activities of Daily Living (to include ambulation/medication): Independent Ambulation: Independent with device- listed below Home Assistive Devices/Equipment: Cane; Eyeglasses (uses cane prn) Medication Administration: Independent Home Management (perform basic housework or laundry): Independent Manage your own finances?: yes Primary transportation is: driving Concerns about vision?: no *vision screening is required for  WTM* Concerns about hearing?: no  Fall Screening Falls in the past year?: 1 Number of falls in past year: 0 Was there an injury with Fall?: 0 Fall Risk Category Calculator: 1 Patient Fall Risk Level: Low Fall Risk  Fall Risk Patient at Risk for Falls Due to: Impaired balance/gait; Impaired mobility; History of fall(s) Fall risk Follow up: Falls evaluation completed; Education provided; Falls prevention discussed  Home and Transportation Safety: All rugs have non-skid backing?: yes All stairs or steps have railings?: yes Grab bars in the bathtub or shower?: (!) no Have non-skid surface in bathtub or shower?: yes Good home lighting?: yes Regular seat belt use?: yes Hospital stays in the last year:: no  Cognitive Assessment Difficulty concentrating, remembering, or making decisions? : no Will 6CIT or Mini Cog be Completed: yes What year is it?: 0 points What month is it?: 0 points Give patient an address phrase to remember (5 components): [address] [place] About what time is it?: 0 points Count backwards from 20 to 1: 0 points Say the months of the year in reverse: 0 points Repeat the address phrase from earlier: 0 points 6 CIT Score: 0 points  Advance Directives (For Healthcare) Does Patient Have a Medical Advance Directive?: No Would patient like information on creating a medical advance directive?: Yes (MAU/Ambulatory/Procedural Areas - Information given)  Reviewed/Updated  Reviewed/Updated: Reviewed All (Medical, Surgical, Family, Medications, Allergies, Care Teams, Patient Goals)    Allergies (verified) Neurontin [gabapentin]   Current Medications (verified) Outpatient Encounter Medications as of 09/12/2024  Medication Sig   diphenhydrAMINE (BENADRYL) 25 mg capsule Take 50 mg by mouth at bedtime as needed for allergies.   methadone  (DOLOPHINE ) 1 mg/ml oral solution Take by mouth daily. 100 mg   olmesartan  (BENICAR ) 20 MG tablet TAKE 1 TABLET BY MOUTH EVERY  DAY   pantoprazole  (PROTONIX ) 40 MG tablet Take  1 tablet (40 mg total) by mouth 2 (two) times daily before a meal.   [DISCONTINUED] amLODipine  (NORVASC ) 5 MG tablet TAKE 1 TABLET (5 MG TOTAL) BY MOUTH DAILY. FOR HIGH BLOOD PRESSURE   No facility-administered encounter medications on file as of 09/12/2024.    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   Depression with anxiety    Dyspnea    bronchitis from allergies   Hepatitis    1980   History of hiatal hernia    History of repair of hiatal hernia    Hypertension    Hyponatremia, acute on chronic 09/13/2021   Pre-diabetes    Radiculopathy    Raynaud's disease    Sensorineural hearing loss (SNHL) of both ears    Tendonitis 11/2019   right wrist and right arm   Tinnitus    Past Surgical History:  Procedure Laterality Date   AXILLARY SURGERY Right 2008   BIOPSY  08/03/2023   Procedure: BIOPSY;  Surgeon: Wilhelmenia Aloha Raddle., MD;  Location: [facility];  Service: Gastroenterology;;   CHOLECYSTECTOMY     COLONOSCOPY N/A 06/25/2024   Procedure: COLONOSCOPY;  Surgeon: Unk Corinn Skiff, MD;  Location: [facility];  Service: Endoscopy;  Laterality: N/A;   COLONOSCOPY N/A 06/26/2024   Procedure: COLONOSCOPY;  Surgeon: Unk Corinn Skiff, MD;  Location: [facility] END[facility]COPY;  Service: Gastroenterology;  Laterality: N/A;   COLONOSCOPY WITH PROPOFOL  N/A 08/21/2023   Procedure: COLONOSCOPY WITH PROPOFOL ;  Surgeon: Jinny Carmine, MD;  Location: [facility];  Service: Endoscopy;  Laterality: N/A;  poor prep   CYSTOCELE REPAIR N/A 02/08/2016   Procedure: ANTERIOR REPAIR (CYSTOCELE);  Surgeon: Debby JINNY Dinsmore, MD;  Location: [facility] ORS;  Service: Gynecology;  Laterality: N/A;   ESOPHAGOGASTRODUODENOSCOPY N/A 08/03/2023   Procedure: ESOPHAGOGASTRODUODENOSCOPY (EGD);  Surgeon: Wilhelmenia Aloha Raddle., MD;  Location: [facility];  Service:  Gastroenterology;  Laterality: N/A;   EUS N/A 08/03/2023   Procedure: UPPER ENDOSCOPIC ULTRASOUND (EUS) RADIAL;  Surgeon: Wilhelmenia Aloha Raddle., MD;  Location: [facility];  Service: Gastroenterology;  Laterality: N/A;   LAPAROSCOPIC GASTRIC SLEEVE RESECTION WITH HIATAL HERNIA REPAIR  2015   POLYPECTOMY N/A 06/25/2024   Procedure: POLYPECTOMY, INTESTINE;  Surgeon: Unk Corinn Skiff, MD;  Location: [facility];  Service: Endoscopy;  Laterality: N/A;   TONSILLECTOMY     TUBAL LIGATION     VAGINAL HYSTERECTOMY N/A 02/08/2016   Procedure: HYSTERECTOMY VAGINAL WITH BSO;  Surgeon: Debby JINNY Dinsmore, MD;  Location: [facility] ORS;  Service: Gynecology;  Laterality: N/A;   VARICOSE VEIN SURGERY     Family History  Problem Relation Age of Onset   Stroke Mother    Cancer Father    Breast cancer Neg Hx    Social History   Occupational History   Occupation: retired  Tobacco Use   Smoking status: Every Day    Current packs/day: 0.25    Average packs/day: 0.3 packs/day for 57.9 years (14.5 ttl pk-yrs)    Types: Cigarettes    Start date: 1968   Smokeless tobacco: Never   Tobacco comments:    2-3 daily  Vaping Use   Vaping status: Never Used  Substance and Sexual Activity   Alcohol use: Not Currently    Alcohol/week: 0.0 standard drinks of alcohol   Drug use: Not Currently    Types: Marijuana, LSD, Cocaine, Methamphetamines, Amphetamines    Comment: used from ages  15-30s   Sexual activity: Yes   Tobacco Counseling Ready to quit: Not Answered Counseling given: Not Answered Tobacco comments: 2-3 daily  SDOH Screenings   Food Insecurity: No Food Insecurity (09/12/2024)  Housing: Low Risk (09/12/2024)  Transportation Needs: No Transportation Needs (09/12/2024)  Utilities: Not At Risk (09/12/2024)  Alcohol Screen: Low Risk (08/24/2023)  Depression (PHQ2-9): Low Risk (09/12/2024)  Financial Resource Strain: Low Risk (08/24/2023)  Physical Activity: Inactive (09/12/2024)   Social Connections: Socially Isolated (09/12/2024)  Stress: No Stress Concern Present (09/12/2024)  Tobacco Use: High Risk (09/12/2024)  Health Literacy: Adequate Health Literacy (09/12/2024)   See flowsheets for full screening details  Depression Screen PHQ 2 & 9 Depression Scale- Over the past 2 weeks, how often have you been bothered by any of the following problems? Little interest or pleasure in doing things: 0 Feeling down, depressed, or hopeless (PHQ Adolescent also includes...irritable): 0 PHQ-2 Total Score: 0 Trouble falling or staying asleep, or sleeping too much: 0 Feeling tired or having little energy: 0 Poor appetite or overeating (PHQ Adolescent also includes...weight loss): 0 Feeling bad about yourself - or that you are a failure or have let yourself or your family down: 0 Trouble concentrating on things, such as reading the newspaper or watching television (PHQ Adolescent also includes...like school work): 0 Moving or speaking so slowly that other people could have noticed. Or the opposite - being so fidgety or restless that you have been moving around a lot more than usual: 0 Thoughts that you would be better off dead, or of hurting yourself in some way: 0 PHQ-9 Total Score: 0 If you checked off any problems, how difficult have these problems made it for you to do your work, take care of things at home, or get along with other people?: Not difficult at all  Depression Treatment Depression Interventions/Treatment : EYV7-0 Score <4 Follow-up Not Indicated     Goals Addressed               This Visit's Progress     be alive next year (pt-stated)               Objective:    Today's Vitals   09/12/24 1515  Weight: 170 lb (77.1 kg)  Height: 5' 1 (1.549 m)   Body mass index is 32.12 kg/m.  Hearing/Vision screen Hearing Screening - Comments:: Denies hearing loss  Vision Screening - Comments:: Needs routine eye exam. Patient to call and  schedule. Immunizations and Health Maintenance Health Maintenance  Topic Date Due   Zoster Vaccines- Shingrix  (1 of 2) Never done   Bone Density Scan  Never done   COVID-19 Vaccine (5 - 2025-26 season) 06/03/2024   DTaP/Tdap/Td (2 - Td or Tdap) 07/16/2024   Influenza Vaccine  12/31/2024 (Originally 05/03/2024)   Medicare Annual Wellness (AWV)  09/12/2025   Colonoscopy  06/26/2034   Pneumococcal Vaccine: 50+ Years  Completed   Hepatitis C Screening  Completed   Meningococcal B Vaccine  Aged Out   Mammogram  Discontinued   Fecal DNA (Cologuard)  Discontinued        Assessment/Plan:  This is a routine wellness examination for Shyna.  Patient Care Team: Justus Leita DEL, MD as PCP - General (Internal Medicine) Unk Corinn Skiff, MD as Consulting Physician (Gastroenterology) [facility], [facility])  I have personally reviewed and noted the following in the patients chart:   Medical and social history Use of alcohol, tobacco or illicit drugs  Current medications  and supplements including opioid prescriptions. Functional ability and status Nutritional status Physical activity Advanced directives List of other physicians Hospitalizations, surgeries, and ER visits in previous 12 months Vitals Screenings to include cognitive, depression, and falls Referrals and appointments  No orders of the defined types were placed in this encounter.  In addition, I have reviewed and discussed with patient certain preventive protocols, quality metrics, and best practice recommendations. A written personalized care plan for preventive services as well as general preventive health recommendations were provided to patient.   Vina Ned, CMA   09/12/2024   Return in 56 weeks (on 10/09/2025) for Medicare Annual Wellness Visit.  After Visit Summary: (Mail) Due to this being a telephonic visit, the after visit summary with patients personalized plan was offered to patient via mail    Nurse Notes:  Needs routine eye exam. Patient to call and schedule. Needs Tdap vaccine (pharmacy) Declined flu, shingles and covid vaccines Declined MMG and DEXA scan Sent message to front desk to schedule a TOC appointment.

## 2024-09-12 NOTE — Patient Instructions (Signed)
 Lori Trevino,  Thank you for taking the time for your Medicare Wellness Visit. I appreciate your continued commitment to your health goals. Please review the care plan we discussed, and feel free to reach out if I can assist you further.  Please note that Annual Wellness Visits do not include a physical exam. Some assessments may be limited, especially if the visit was conducted virtually. If needed, we may recommend an in-person follow-up with your provider.  Ongoing Care Seeing your primary care provider every 3 to 6 months helps us  monitor your health and provide consistent, personalized care.   Referrals If a referral was made during today's visit and you haven't received any updates within two weeks, please contact the referred provider directly to check on the status.  Recommended Screenings:  Call to schedule a routine eye exam. You should have an eye exam evey 1-2 years.  You may get a tetanus vaccine at your local pharmacy at your convenience.   Health Maintenance  Topic Date Due   Zoster (Shingles) Vaccine (1 of 2) Never done   Osteoporosis screening with Bone Density Scan  Never done   Flu Shot  05/03/2024   COVID-19 Vaccine (5 - 2025-26 season) 06/03/2024   DTaP/Tdap/Td vaccine (2 - Td or Tdap) 07/16/2024   Medicare Annual Wellness Visit  08/23/2024   Colon Cancer Screening  06/26/2034   Pneumococcal Vaccine for age over 55  Completed   Hepatitis C Screening  Completed   Meningitis B Vaccine  Aged Out   Breast Cancer Screening  Discontinued   Cologuard (Stool DNA test)  Discontinued       09/12/2024    3:22 PM  Advanced Directives  Does Patient Have a Medical Advance Directive? No  Would patient like information on creating a medical advance directive? Yes (MAU/Ambulatory/Procedural Areas - Information given)    Vision: Annual vision screenings are recommended for early detection of glaucoma, cataracts, and diabetic retinopathy. These exams can also reveal signs of  chronic conditions such as diabetes and high blood pressure.  Dental: Annual dental screenings help detect early signs of oral cancer, gum disease, and other conditions linked to overall health, including heart disease and diabetes.  Please see the attached documents for additional preventive care recommendations.

## 2024-09-15 ENCOUNTER — Other Ambulatory Visit: Payer: Self-pay | Admitting: Internal Medicine

## 2024-09-15 DIAGNOSIS — I10 Essential (primary) hypertension: Secondary | ICD-10-CM

## 2024-09-17 NOTE — Telephone Encounter (Signed)
 Requested Prescriptions  Pending Prescriptions Disp Refills   olmesartan  (BENICAR ) 20 MG tablet [Pharmacy Med Name: OLMESARTAN  MEDOXOMIL 20 MG TAB] 90 tablet 0    Sig: TAKE 1 TABLET BY MOUTH EVERY DAY     Cardiovascular:  Angiotensin Receptor Blockers Failed - 09/17/2024  3:56 PM      Failed - Cr in normal range and within 180 days    Creatinine, Ser  Date Value Ref Range Status  04/05/2023 0.64 0.57 - 1.00 mg/dL Final         Failed - K in normal range and within 180 days    Potassium  Date Value Ref Range Status  04/05/2023 5.0 3.5 - 5.2 mmol/L Final         Passed - Patient is not pregnant      Passed - Last BP in normal range    BP Readings from Last 1 Encounters:  06/26/24 135/76         Passed - Valid encounter within last 6 months    Recent Outpatient Visits           9 months ago Essential hypertension   [facility], Leita DEL, MD

## 2024-10-09 ENCOUNTER — Ambulatory Visit: Admit: 2024-10-09 | Admitting: Gastroenterology

## 2024-11-01 ENCOUNTER — Ambulatory Visit: Admitting: Student

## 2025-02-04 ENCOUNTER — Encounter: Admitting: Student

## 2025-10-09 ENCOUNTER — Ambulatory Visit
